# Patient Record
Sex: Female | Born: 1950 | Race: White | Hispanic: No | Smoking: Never smoker
Health system: Southern US, Community
[De-identification: ages and names within clinical notes are randomized; demographics above are authoritative.]

## PROBLEM LIST (undated history)

## (undated) DIAGNOSIS — A770 Spotted fever due to Rickettsia rickettsii: Secondary | ICD-10-CM

## (undated) DIAGNOSIS — I5181 Takotsubo syndrome: Secondary | ICD-10-CM

## (undated) DIAGNOSIS — F329 Major depressive disorder, single episode, unspecified: Secondary | ICD-10-CM

## (undated) DIAGNOSIS — F419 Anxiety disorder, unspecified: Secondary | ICD-10-CM

## (undated) DIAGNOSIS — I10 Essential (primary) hypertension: Secondary | ICD-10-CM

## (undated) DIAGNOSIS — G919 Hydrocephalus, unspecified: Secondary | ICD-10-CM

## (undated) DIAGNOSIS — F32A Depression, unspecified: Secondary | ICD-10-CM

## (undated) HISTORY — DX: Depression, unspecified: F32.A

## (undated) HISTORY — PX: TONSILLECTOMY: SUR1361

## (undated) HISTORY — PX: CHOLECYSTECTOMY: SHX55

## (undated) HISTORY — DX: Essential (primary) hypertension: I10

## (undated) HISTORY — DX: Hydrocephalus, unspecified: G91.9

## (undated) HISTORY — DX: Major depressive disorder, single episode, unspecified: F32.9

## (undated) HISTORY — DX: Anxiety disorder, unspecified: F41.9

## (undated) HISTORY — PX: CARDIAC CATHETERIZATION: SHX172

## (undated) HISTORY — PX: BRAIN SURGERY: SHX531

## (undated) HISTORY — DX: Takotsubo syndrome: I51.81

## (undated) HISTORY — PX: ABDOMINAL HYSTERECTOMY: SHX81

## (undated) HISTORY — PX: OTHER SURGICAL HISTORY: SHX169

## (undated) HISTORY — DX: Spotted fever due to Rickettsia rickettsii: A77.0

---

## 2002-03-09 ENCOUNTER — Encounter: Payer: Self-pay | Admitting: Emergency Medicine

## 2002-03-09 ENCOUNTER — Emergency Department (HOSPITAL_COMMUNITY): Admission: EM | Admit: 2002-03-09 | Discharge: 2002-03-09 | Payer: Self-pay | Admitting: Emergency Medicine

## 2004-08-19 ENCOUNTER — Other Ambulatory Visit: Admission: RE | Admit: 2004-08-19 | Discharge: 2004-08-19 | Payer: Self-pay | Admitting: Dermatology

## 2008-10-26 DIAGNOSIS — A770 Spotted fever due to Rickettsia rickettsii: Secondary | ICD-10-CM

## 2008-10-26 DIAGNOSIS — G919 Hydrocephalus, unspecified: Secondary | ICD-10-CM

## 2008-10-26 HISTORY — DX: Hydrocephalus, unspecified: G91.9

## 2008-10-26 HISTORY — PX: OTHER SURGICAL HISTORY: SHX169

## 2008-10-26 HISTORY — DX: Spotted fever due to Rickettsia rickettsii: A77.0

## 2009-02-21 ENCOUNTER — Emergency Department (HOSPITAL_COMMUNITY): Admission: EM | Admit: 2009-02-21 | Discharge: 2009-02-21 | Payer: Self-pay | Admitting: Emergency Medicine

## 2009-03-07 ENCOUNTER — Inpatient Hospital Stay (HOSPITAL_COMMUNITY): Admission: RE | Admit: 2009-03-07 | Discharge: 2009-03-09 | Payer: Self-pay | Admitting: Neurological Surgery

## 2009-04-01 ENCOUNTER — Encounter: Admission: RE | Admit: 2009-04-01 | Discharge: 2009-04-01 | Payer: Self-pay | Admitting: Neurological Surgery

## 2009-04-08 ENCOUNTER — Encounter: Admission: RE | Admit: 2009-04-08 | Discharge: 2009-04-08 | Payer: Self-pay | Admitting: Neurological Surgery

## 2009-05-06 ENCOUNTER — Encounter: Admission: RE | Admit: 2009-05-06 | Discharge: 2009-05-06 | Payer: Self-pay | Admitting: Neurological Surgery

## 2009-05-21 ENCOUNTER — Encounter: Admission: RE | Admit: 2009-05-21 | Discharge: 2009-05-21 | Payer: Self-pay | Admitting: *Deleted

## 2009-07-12 ENCOUNTER — Encounter: Admission: RE | Admit: 2009-07-12 | Discharge: 2009-07-12 | Payer: Self-pay | Admitting: Neurological Surgery

## 2009-12-27 ENCOUNTER — Encounter: Admission: RE | Admit: 2009-12-27 | Discharge: 2009-12-27 | Payer: Self-pay | Admitting: Neurological Surgery

## 2010-02-25 ENCOUNTER — Ambulatory Visit: Payer: Self-pay | Admitting: Family Medicine

## 2010-02-25 DIAGNOSIS — F329 Major depressive disorder, single episode, unspecified: Secondary | ICD-10-CM | POA: Insufficient documentation

## 2010-02-25 DIAGNOSIS — I10 Essential (primary) hypertension: Secondary | ICD-10-CM

## 2010-02-25 DIAGNOSIS — R1909 Other intra-abdominal and pelvic swelling, mass and lump: Secondary | ICD-10-CM

## 2010-02-25 DIAGNOSIS — K5909 Other constipation: Secondary | ICD-10-CM | POA: Insufficient documentation

## 2010-02-25 DIAGNOSIS — F411 Generalized anxiety disorder: Secondary | ICD-10-CM | POA: Insufficient documentation

## 2010-02-25 DIAGNOSIS — J45909 Unspecified asthma, uncomplicated: Secondary | ICD-10-CM | POA: Insufficient documentation

## 2010-02-25 DIAGNOSIS — N760 Acute vaginitis: Secondary | ICD-10-CM | POA: Insufficient documentation

## 2010-02-26 ENCOUNTER — Encounter: Payer: Self-pay | Admitting: Physician Assistant

## 2010-02-26 LAB — CONVERTED CEMR LAB
Candida species: NEGATIVE
Gardnerella vaginalis: NEGATIVE

## 2010-02-27 ENCOUNTER — Telehealth: Payer: Self-pay | Admitting: Family Medicine

## 2010-03-04 ENCOUNTER — Encounter (INDEPENDENT_AMBULATORY_CARE_PROVIDER_SITE_OTHER): Payer: Self-pay | Admitting: *Deleted

## 2010-03-04 ENCOUNTER — Ambulatory Visit (HOSPITAL_COMMUNITY): Admission: RE | Admit: 2010-03-04 | Discharge: 2010-03-04 | Payer: Self-pay | Admitting: Family Medicine

## 2010-03-06 ENCOUNTER — Encounter: Payer: Self-pay | Admitting: Physician Assistant

## 2010-03-06 ENCOUNTER — Telehealth: Payer: Self-pay | Admitting: Family Medicine

## 2010-03-11 ENCOUNTER — Ambulatory Visit (HOSPITAL_COMMUNITY): Admission: RE | Admit: 2010-03-11 | Discharge: 2010-03-11 | Payer: Self-pay | Admitting: Family Medicine

## 2010-03-21 ENCOUNTER — Ambulatory Visit: Payer: Self-pay | Admitting: Family Medicine

## 2010-03-21 ENCOUNTER — Other Ambulatory Visit: Admission: RE | Admit: 2010-03-21 | Discharge: 2010-03-21 | Payer: Self-pay | Admitting: Family Medicine

## 2010-03-21 DIAGNOSIS — Z780 Asymptomatic menopausal state: Secondary | ICD-10-CM | POA: Insufficient documentation

## 2010-03-21 DIAGNOSIS — R252 Cramp and spasm: Secondary | ICD-10-CM

## 2010-03-22 ENCOUNTER — Encounter: Payer: Self-pay | Admitting: Physician Assistant

## 2010-03-24 ENCOUNTER — Telehealth: Payer: Self-pay | Admitting: Physician Assistant

## 2010-03-25 LAB — CONVERTED CEMR LAB: Candida species: NEGATIVE

## 2010-03-26 ENCOUNTER — Encounter: Payer: Self-pay | Admitting: Physician Assistant

## 2010-03-26 DIAGNOSIS — I5181 Takotsubo syndrome: Secondary | ICD-10-CM

## 2010-03-26 HISTORY — DX: Takotsubo syndrome: I51.81

## 2010-04-01 ENCOUNTER — Encounter: Payer: Self-pay | Admitting: Physician Assistant

## 2010-04-01 LAB — CONVERTED CEMR LAB
Albumin: 4.6 g/dL (ref 3.5–5.2)
Alkaline Phosphatase: 60 units/L (ref 39–117)
BUN: 12 mg/dL (ref 6–23)
HCT: 40.3 % (ref 36.0–46.0)
HDL: 44 mg/dL (ref >39)
MCV: 103.9 fL — ABNORMAL HIGH (ref 78.0–100.0)
Platelets: 247 10*3/uL (ref 150–400)
RBC: 3.88 M/uL (ref 3.87–5.11)
TSH: 1.913 microintl units/mL (ref 0.350–4.500)
Total Bilirubin: 0.6 mg/dL (ref 0.3–1.2)
Total Protein: 7.1 g/dL (ref 6.0–8.3)
Triglycerides: 135 mg/dL (ref <150)
VLDL: 27 mg/dL (ref 0–40)

## 2010-04-04 ENCOUNTER — Ambulatory Visit (HOSPITAL_COMMUNITY): Admission: RE | Admit: 2010-04-04 | Discharge: 2010-04-04 | Payer: Self-pay | Admitting: Family Medicine

## 2010-04-08 ENCOUNTER — Ambulatory Visit: Payer: Self-pay | Admitting: Gastroenterology

## 2010-04-18 ENCOUNTER — Ambulatory Visit: Payer: Self-pay | Admitting: Internal Medicine

## 2010-04-18 ENCOUNTER — Encounter (INDEPENDENT_AMBULATORY_CARE_PROVIDER_SITE_OTHER): Payer: Self-pay | Admitting: Interventional Cardiology

## 2010-04-18 ENCOUNTER — Telehealth: Payer: Self-pay | Admitting: Physician Assistant

## 2010-04-19 ENCOUNTER — Encounter: Payer: Self-pay | Admitting: Physician Assistant

## 2010-05-02 ENCOUNTER — Encounter: Payer: Self-pay | Admitting: Physician Assistant

## 2010-05-06 ENCOUNTER — Ambulatory Visit: Payer: Self-pay | Admitting: Family Medicine

## 2010-05-06 DIAGNOSIS — E785 Hyperlipidemia, unspecified: Secondary | ICD-10-CM | POA: Insufficient documentation

## 2010-05-30 ENCOUNTER — Encounter: Payer: Self-pay | Admitting: Physician Assistant

## 2010-05-30 ENCOUNTER — Ambulatory Visit (HOSPITAL_COMMUNITY): Admission: RE | Admit: 2010-05-30 | Discharge: 2010-05-30 | Payer: Self-pay | Admitting: Family Medicine

## 2010-06-02 ENCOUNTER — Telehealth: Payer: Self-pay | Admitting: Physician Assistant

## 2010-06-03 ENCOUNTER — Telehealth: Payer: Self-pay | Admitting: Physician Assistant

## 2010-06-09 ENCOUNTER — Telehealth: Payer: Self-pay | Admitting: Family Medicine

## 2010-06-13 ENCOUNTER — Ambulatory Visit: Payer: Self-pay | Admitting: Family Medicine

## 2010-06-13 DIAGNOSIS — L259 Unspecified contact dermatitis, unspecified cause: Secondary | ICD-10-CM

## 2010-06-17 ENCOUNTER — Telehealth: Payer: Self-pay | Admitting: Physician Assistant

## 2010-07-03 ENCOUNTER — Encounter (INDEPENDENT_AMBULATORY_CARE_PROVIDER_SITE_OTHER): Payer: Self-pay | Admitting: *Deleted

## 2010-08-07 ENCOUNTER — Encounter: Payer: Self-pay | Admitting: Physician Assistant

## 2010-08-07 LAB — CONVERTED CEMR LAB
Alkaline Phosphatase: 56 units/L (ref 39–117)
Cholesterol: 154 mg/dL (ref 0–200)
Indirect Bilirubin: 0.5 mg/dL (ref 0.0–0.9)
Total Protein: 7.2 g/dL (ref 6.0–8.3)
Triglycerides: 141 mg/dL (ref <150)
VLDL: 28 mg/dL (ref 0–40)

## 2010-08-12 ENCOUNTER — Ambulatory Visit: Payer: Self-pay | Admitting: Family Medicine

## 2010-08-12 DIAGNOSIS — E559 Vitamin D deficiency, unspecified: Secondary | ICD-10-CM | POA: Insufficient documentation

## 2010-08-12 DIAGNOSIS — B351 Tinea unguium: Secondary | ICD-10-CM | POA: Insufficient documentation

## 2010-08-13 ENCOUNTER — Telehealth: Payer: Self-pay | Admitting: Physician Assistant

## 2010-08-13 ENCOUNTER — Encounter: Payer: Self-pay | Admitting: Physician Assistant

## 2010-08-13 LAB — CONVERTED CEMR LAB
BUN: 16 mg/dL (ref 6–23)
CO2: 32 meq/L (ref 19–32)
Calcium: 10.3 mg/dL (ref 8.4–10.5)
Chloride: 101 meq/L (ref 96–112)
Glucose, Bld: 162 mg/dL — ABNORMAL HIGH (ref 70–99)
Potassium: 3.8 meq/L (ref 3.5–5.3)
Sodium: 142 meq/L (ref 135–145)
Vit D, 25-Hydroxy: 51 ng/mL (ref 30–89)

## 2010-10-02 ENCOUNTER — Inpatient Hospital Stay (HOSPITAL_COMMUNITY): Admission: EM | Admit: 2010-10-02 | Discharge: 2010-04-19 | Payer: Self-pay | Admitting: Emergency Medicine

## 2010-10-03 ENCOUNTER — Telehealth: Payer: Self-pay | Admitting: Family Medicine

## 2010-11-04 ENCOUNTER — Encounter: Payer: Self-pay | Admitting: Family Medicine

## 2010-11-12 ENCOUNTER — Telehealth: Payer: Self-pay | Admitting: Family Medicine

## 2010-11-16 ENCOUNTER — Encounter: Payer: Self-pay | Admitting: Family Medicine

## 2010-11-18 ENCOUNTER — Ambulatory Visit: Admission: RE | Admit: 2010-11-18 | Payer: Self-pay | Source: Home / Self Care | Admitting: Family Medicine

## 2010-11-18 ENCOUNTER — Ambulatory Visit
Admission: RE | Admit: 2010-11-18 | Discharge: 2010-11-18 | Payer: Self-pay | Source: Home / Self Care | Attending: Family Medicine | Admitting: Family Medicine

## 2010-11-19 ENCOUNTER — Encounter: Payer: Self-pay | Admitting: Family Medicine

## 2010-11-25 NOTE — Letter (Signed)
Summary: Laboratory/X-Ray Results  [facility]  [address]   [place], [place] [address]   Phone: [phone]  Fax: [phone]    Lab/X-Ray Results  April 01, 2010  MRN: [id number]  Angela Duncan [address] [place], [place]  [address]    The results of your recent lab/x-ray has been reviewed and were found:  Your cholesterol levels are high.  I have enclosed a cholesterol diet handout for you.  You need to follow a low fat diet to try to lower your cholesterol.  Exercise will also help.  Try to walk 3 or more days a week for at least 30 minutes.  You will need to have your cholesterol levels checked again in 3 months.  If your cholesterol stays high at that time then you will need cholesterol medication.  Your Vitamin D level is low.  I have sent a prescription for Vitamin D to your pharmacy in [place].  You will take this once a week.  If you have any questions, please contact our office.     Esperanza Sheets PA

## 2010-11-25 NOTE — Progress Notes (Signed)
Summary: [facility]   Imported By: Lind Guest 05/06/2010 16:38:08  _____________________________________________________________________  External Attachment:    Type:   Image     Comment:   External Document

## 2010-11-25 NOTE — Assessment & Plan Note (Signed)
Summary: follow up from hospital- room 1   Vital Signs:  Patient profile:   60 year old female Height:      63.5 inches Weight:      127.75 pounds BMI:     22.36 O2 Sat:      94 % on Room air Pulse rate:   72 / minute Resp:     16 per minute BP sitting:   162 / 90  (left arm)  Vitals Entered By: Adella Hare LPN (May 06, 2010 1:01 PM) CC: hospital follow up Is Patient Diabetic? No Pain Assessment Patient in pain? no      Comments did not bring meds to ov   Primary Provider:  Garnette Czech, [place]  CC:  hospital follow up.  History of Present Illness: Pt presents today with c/o shortness of breath with exertion.  She has had this for years and no change.  Even just walking to her mailbox she has difficulty breathing.  She was previously seeing Dr Juanetta Gosling and in the past was prescribed Advair.  Pt stopped using this, states that it didnt help.    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  Cardiac eval was neg.  Pt saw Dr Katrinka Blazing, cardiologist for f/u.  Her next f/u appt is in 6 mos. Pt states he ordered Nitro for her to use as needed, and also started her on Crestor.  No f/u labs ordered per pt.  Pt has no other complaints or concerns.    Allergies (verified): No Known Drug Allergies  Past History:  Past medical, surgical, family and social histories (including risk factors) reviewed, and no changes noted (except as noted below).  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  We will manage her gynecology care.  Past Surgical History: Reviewed history from 04/08/2010 and no changes required. 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 mother living-  thyroid dz father deceased- htn one brother living-heart dz two sisters living- htn, hyperlipidemia, thyroid No FH of Colon Cancer or polyps  Social History: Reviewed history from 04/08/2010 and no changes required. 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 CV:  Complains of shortness of breath with exertion; denies chest pain or discomfort and palpitations. Resp:  Complains of cough, shortness of breath, and sputum productive; denies wheezing; INTERMITTENT COUGH, SOMETIMES PRODUCTIVE WITH WHITE PHLEGM.  Physical Exam  General:  Well-developed,well-nourished,in no acute distress; alert,appropriate and cooperative throughout examination Head:  Normocephalic and atraumatic without obvious abnormalities. No apparent alopecia or balding. Ears:  External ear exam shows no significant lesions or deformities.  Otoscopic examination reveals clear canals, tympanic membranes are intact bilaterally without bulging, retraction, inflammation or discharge. Hearing is grossly normal bilaterally. Nose:  External nasal examination shows no deformity or inflammation. Nasal mucosa are pink and moist without lesions or exudates. Mouth:  Oral mucosa and oropharynx without lesions or exudates. Neck:  No deformities, masses, or tenderness noted. Lungs:  Normal respiratory effort, chest expands symmetrically. Lungs are clear to auscultation, no crackles or wheezes. Heart:  Normal rate and regular rhythm. S1 and S2 normal without  gallop, murmur, click, rub or other extra sounds. Cervical Nodes:  No lymphadenopathy noted Psych:  Cognition and judgment appear intact. Alert and cooperative with normal attention span and concentration. No apparent delusions, illusions, hallucinations   Impression & Recommendations:  Problem # 1:   ASTHMA (ICD-493.90) Assessment Unchanged  Her updated medication list for this problem includes:    Symbicort 80-4.5 Mcg/act Aero (Budesonide-formoterol fumarate) ..... Use 2 puffs two times a day  Orders: PFT Baseline-Pre/Post Bronchodiolator (PFT Baseline-Pre/Pos)  Problem # 2:  HYPERTENSION (ICD-401.9) Assessment: Deteriorated  The following medications were removed from the medication list:    Clonidine Hcl 0.2 Mg Tabs (Clonidine hcl) .Marland Kitchen... Take 1 tablet at bedtime Her updated medication list for this problem includes:    Lisinopril 5 Mg Tabs (Lisinopril) ..... One tab by mouth once daily    Hydrochlorothiazide 25 Mg Tabs (Hydrochlorothiazide) ..... One tab by mouth once daily  BP today: 162/90 Prior BP: 132/82 (04/08/2010)  Labs Reviewed: K+: 4.0 (03/26/2010) Creat: : 0.63 (03/26/2010)   Chol: 232 (03/26/2010)   HDL: 44 (03/26/2010)   LDL: 161 (03/26/2010)   TG: 135 (03/26/2010)  Problem # 3:  HYPERLIPIDEMIA (ICD-272.4) Assessment: New  Her updated medication list for this problem includes:    Crestor 40 Mg Tabs (Rosuvastatin calcium) .Marland Kitchen... Take 1 daily  Orders: T-Lipid Profile ([id number]) T-Hepatic Function ([phone])  Complete Medication List: 1)  Alprazolam 0.5 Mg Tabs (Alprazolam) .... At bedtime 2)  Lisinopril 5 Mg Tabs (Lisinopril) .... One tab by mouth once daily 3)  Hydrochlorothiazide 25 Mg Tabs (Hydrochlorothiazide) .... One tab by mouth once daily 4)  Aspir-low 81 Mg Tbec (Aspirin) .... One tab by mouth once daily 5)  Vitamin D (ergocalciferol) 50000 Unit Caps (Ergocalciferol) .... Take 1 weekly 6)  Nitrostat 0.4 Mg Subl (Nitroglycerin) .... As directed 7)  Crestor 40 Mg Tabs (Rosuvastatin calcium) .... Take 1 daily 8)  Symbicort 80-4.5 Mcg/act Aero (Budesonide-formoterol fumarate) .... Use 2 puffs two times a day  Patient Instructions: 1)  Please schedule a follow-up appointment in 3 months. 2)  I have ordered blood work for you to have drawn in 3  mos fasting. 3)  I have ordered a breathing test.  This will be scheduled at the pharmacy. 4)  Use Symbicort inhaler two times a day as discussed. Prescriptions: SYMBICORT 80-4.5 MCG/ACT AERO (BUDESONIDE-FORMOTEROL FUMARATE) use 2 puffs two times a day  #1 x 1   Entered and Authorized by:   Esperanza Sheets PA   Signed by:   Esperanza Sheets PA on 05/06/2010   Method used:   Electronically to        [facility] (retail[facility]       [address]       [place], [place]  [address]       Ph: [phone]       Fax: [phone]   RxID:   [id number]

## 2010-11-25 NOTE — Progress Notes (Signed)
Summary: creatine  Phone Note Call from Patient   Summary of Call: pt needs to get creatine drawn at the radiology department. She has a CT scheduled for 03/11/2010 12:30. Please send order over.  Initial call taken by: Rudene Anda,  Mar 06, 2010 3:36 PM  Follow-up for Phone Call        order sent Follow-up by: Adella Hare LPN,  Mar 06, 2010 4:10 PM

## 2010-11-25 NOTE — Letter (Signed)
Summary: Recall Colonoscopy/Endoscopy, Change to Office Visit  [facility]  [address]   [place], [place] [address]   Phone: [phone]  Fax: [phone]      July 03, 2010   BLAYNE GARLICK [address] [place], [place]  [address] 03/09/1951   Dear Ms. Jilek,   According to our records, it is time for you to schedule a Colonoscopy/Endoscopy. However, after reviewing your medical record, we recommend an office visit in order to determine your need for a repeat procedure.  Please call [phone] at your convenience to schedule an office visit. If you have any questions or concerns, please feel free to contact our office.   Sincerely,   Ave Filter  [facility] Ph: [phone]   Fax: [phone]

## 2010-11-25 NOTE — Progress Notes (Signed)
       New/Updated Medications: METROGEL-VAGINAL 0.75 % GEL (METRONIDAZOLE) insert 1 applicator vaginally at bedtime for 5 nights Prescriptions: METROGEL-VAGINAL 0.75 % GEL (METRONIDAZOLE) insert 1 applicator vaginally at bedtime for 5 nights  #1 pack x 0   Entered and Authorized by:   Esperanza Sheets PA   Signed by:   Esperanza Sheets PA on 03/24/2010   Method used:   Electronically to        [facility] (retail)       [address]       [place], [place]  [address]       Ph: [phone]       Fax: [phone]   RxID:   [id number]

## 2010-11-25 NOTE — Progress Notes (Signed)
  Phone Note Call from Patient   Summary of Call: Pt returned my call regarding PFT results.  Pt states she is not using Symbicort.  She tried it and it made her shakey so she stopped.  States she is still shortness of breath, even just walking to the mailbox. Advised pt her PFT shows that her Asthma is severe and that she needs to be on an inhaler every day.  Advised pt that she must use her Symbicort 2 puffs every morning and night.  Discussed with pt that some people to get shakey, but that this usually goes away after about 15 minutes. Initial call taken by: Esperanza Sheets PA,  June 02, 2010 3:50 PM

## 2010-11-25 NOTE — Progress Notes (Signed)
Summary: medication  Phone Note Call from Patient   Summary of Call: patient called in and questioned about her medication being called into the pharmacy in [place], I told her that clonazepam and a cream was called in yesterday.  She states she called the pharmacy and they only got the cream medication.  Please advise.  Thanks Initial call taken by: Curtis Sites,  August 13, 2010 3:57 PM  Follow-up for Phone Call        called and gave order to pharmacist verbally Follow-up by: Adella Hare LPN,  August 14, 2010 11:07 AM

## 2010-11-25 NOTE — Progress Notes (Signed)
Summary: test results  Phone Note Call from Patient   Summary of Call: needs to speak with nurse about test results again. sister [phone] Initial call taken by: Rudene Anda,  Feb 27, 2010 1:05 PM  Follow-up for Phone Call        wanted name of std again, trying to help patient understand Follow-up by: Adella Hare LPN,  Feb 28, 4539 3:01 PM

## 2010-11-25 NOTE — Letter (Signed)
Summary: Discharge Summary  Discharge Summary   Imported By: Lind Guest 05/06/2010 16:38:31  _____________________________________________________________________  External Attachment:    Type:   Image     Comment:   External Document

## 2010-11-25 NOTE — Progress Notes (Signed)
Summary: call  Phone Note Call from Patient   Summary of Call: said you forgot to tell her something so call her back Initial call taken by: Lind Guest,  June 03, 2010 9:02 AM  Follow-up for Phone Call        Discussed with pt the difference btwn maintenance inhaler (Symbicort) and use of rescue inhaler ( albuterol).  Pt verbalizes understanding. Follow-up by: Esperanza Sheets PA,  June 03, 2010 10:27 AM

## 2010-11-25 NOTE — Assessment & Plan Note (Signed)
Summary: new patient- room 1   Vital Signs:  Patient profile:   60 year old female Height:      63.5 inches Weight:      132 pounds BMI:     23.10 O2 Sat:      94 % on Room air Pulse rate:   65 / minute Resp:     16 per minute BP sitting:   134 / 80  (left arm)  Vitals Entered By: Adella Hare LPN (Feb 25, 1609 10:05 AM) CC: new patient Is Patient Diabetic? No Pain Assessment Patient in pain? no        CC:  new patient.  History of Present Illness: New pt here to establish care.  She is present with her sister today, who helps her with her communication & understanding. Has been seeing Dr Juanetta Gosling and he advised her that she needed a GYN due to her vag dischg complaints.  She still plans to see Dr Juanetta Gosling for her other medical management.  Pt states she has had vag dischg x 2 weeks now.  Doesn't seem to be as bad today as it was a couple days ago. She states it has had an odor & itching.  Pt does have a new partner & is not using condoms.  Hx of htn. Taking medications as prescribed. No headache, chest pain or palpitations. Has been using Alprazolam at bedtime since husb passed away 11-13-2008. Dx with asthma 10 or more yrs ago.  Used inhalers in the past, but had run out & was unable to afford them.       Current Medications (verified): 1)  Alprazolam 0.5 Mg Tabs (Alprazolam) .... One Tab By Mouth Two Times A Day As Needed For Nerves 2)  Lisinopril 5 Mg Tabs (Lisinopril) .... One Tab By Mouth Once Daily 3)  Hydrochlorothiazide 25 Mg Tabs (Hydrochlorothiazide) .... One Tab By Mouth Once Daily 4)  Aspir-Low 81 Mg Tbec (Aspirin) .... One Tab By Mouth Once Daily  Allergies (verified): No Known Drug Allergies  Past History:  Past medical, surgical, family and social histories (including risk factors) reviewed for relevance to current acute and chronic problems.  Past Medical History: Anxiety Asthma Depression Hypertension Hydrocephalus - 2010 [facility] Spotted  Fever - 2010  Dr Juanetta Gosling managing pts medical care (anxiety, HTN, Asthma).  We will manage her gynecology care.  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 mother living- thyroid dz father deceased- htn one brother living-heart dz two sisters living- htn, hyperlipidemia, thyroid  Social History: Reviewed history and no changes required. 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 CV:  Complains of palpitations; denies chest pain or discomfort; MY HEART GOES CRAZY SOMETIMES, WHEN I OVER-DO MYSELF. Resp:  Complains of cough, shortness of breath, and wheezing. GI:  Complains of constipation; denies abdominal pain; HX OF CONSTIPATION X YRS.  MUST USE ENEMA TO HAVE BM & DOESNT HAVE A BM UNLESS USES ONE.  NO PREVIOUS COLONOSCOPY. GU:  Complains of discharge; denies abnormal vaginal bleeding, dysuria, and genital sores. MS:  Complains of cramps; BILAT LE AT HS, EVERY NIGHT.  Physical Exam  General:  Well-developed,well-nourished,in no acute distress; alert,appropriate and cooperative throughout examination Head:  Normocephalic and atraumatic  without obvious abnormalities. No apparent alopecia or balding. Ears:  External ear exam shows no significant lesions or deformities.  Otoscopic examination reveals clear canals, tympanic membranes are intact bilaterally without bulging, retraction, inflammation or discharge. Hearing is grossly normal bilaterally. Nose:  External nasal examination shows no deformity or inflammation. Nasal mucosa are pink and moist without lesions or exudates. Mouth:  Oral mucosa and oropharynx without lesions or exudates.  teeth missing.   Neck:  No deformities, masses, or  tenderness noted. Lungs:  Normal respiratory effort, chest expands symmetrically. Lungs are clear to auscultation, no crackles or wheezes. Heart:  Normal rate and regular rhythm. S1 and S2 normal without gallop, murmur, click, rub or other extra sounds. Abdomen:  soft and normal bowel sounds.  Diffuse TTP, no guarding, rebound or rigidity.  Genitalia:  normal introitus and no external lesions.  Cervix absent.  Petechiae noted vag wall, with lg amt of yellowish frothy dischg.  Bimanual vag absent.  Rt adnexal mass grapefruit sized noted, + TTP. Cervical Nodes:  No lymphadenopathy noted Psych:  normally interactive and good eye contact.     Impression & Recommendations:  Problem # 1:  VAGINITIS (ICD-616.10) Assessment New  Discussed with pt that this appears to be trichamoniasis.  Will need to wait for cx results to confirm. Discussed with pt that this is an STD.  She has a hard time understanding this.  Asks that I call her sister with her test results & she is on pts release of information form.  Her updated medication list for this problem includes:    Flagyl 500 Mg Tabs (Metronidazole) .Marland Kitchen... Take 1 tablet two times a day for 7 days  Orders: T-Wet Prep by Molecular Probe [phone]) T-Chlamydia & GC Probe, Genital ([phone])  Problem # 2:  CONSTIPATION, CHRONIC (ICD-564.09) Assessment: Comment Only  Orders: Gastroenterology Referral (GI)  Problem # 3:  ADNEXAL MASS, RIGHT (ICD-789.39) Assessment: New  Orders: Pelvic Ultrasound (Sono Pelvis)  Complete Medication List: 1)  Alprazolam 0.5 Mg Tabs (Alprazolam) .... One tab by mouth two times a day as needed for nerves 2)  Lisinopril 5 Mg Tabs (Lisinopril) .... One tab by mouth once daily 3)  Hydrochlorothiazide 25 Mg Tabs (Hydrochlorothiazide) .... One tab by mouth once daily 4)  Aspir-low 81 Mg Tbec (Aspirin) .... One tab by mouth once daily 5)  Flagyl 500 Mg Tabs (Metronidazole) .... Take 1 tablet two times a day  for 7 days  Patient Instructions: 1)  Continue care with Dr Juanetta Gosling.  Contact him regarding your asthma. 2)  I have referred you to a GI Dr regarding your constipation. 3)  I have prescribed an antibiotic for your vaginal infection. 4)  I have ordered a pelvic ultrasound. 5)  You will need to schedule an appt for a breast exam & pap smear in the next 1-2 mos. Prescriptions: FLAGYL 500 MG TABS (METRONIDAZOLE) take 1 tablet two times a day for 7 days  #14 x 0   Entered and Authorized by:   Esperanza Sheets PA   Signed by:   Esperanza Sheets PA on 02/25/2010   Method used:   [facility]ron[facility] to        [facility]       [address]       [place], [place]  [address]       Ph: [phone]       Fax: [phone]   RxID:   [id number]

## 2010-11-25 NOTE — Assessment & Plan Note (Signed)
Summary: CHRONIC CONSTIPATION   Visit Type:  Initial Consult Primary Care Provider:  Garnette Czech, [place]  Chief Complaint:  constipation.  History of Present Illness: Has constipation all her life. Gives herself a water enema: 1-2 times a week when her stomach starts working. Meds tried: no meds tried. No TCS ever. No rectal bleeding. Water: not much. Fiber: loves salad, broccoli,  cauliflower. No fiber supplements tried. No thyroid check. Sometimes gets the urge to have a BMs: solid, no diarrhea. 1 child: ? vaginal tear. No pelvic trauma. Partial hysterectomy: bowels no worse after surgery.  Current Medications (verified): 1)  Alprazolam 0.5 Mg Tabs (Alprazolam) .... At Bedtime 2)  Lisinopril 5 Mg Tabs (Lisinopril) .... One Tab By Mouth Once Daily 3)  Hydrochlorothiazide 25 Mg Tabs (Hydrochlorothiazide) .... One Tab By Mouth Once Daily 4)  Aspir-Low 81 Mg Tbec (Aspirin) .... One Tab By Mouth Once Daily 5)  Clonidine Hcl 0.2 Mg Tabs (Clonidine Hcl) .... Take 1 Tablet At Bedtime 6)  Vitamin D (Ergocalciferol) 50000 Unit Caps (Ergocalciferol) .... Take 1 Weekly  Allergies (verified): No Known Drug Allergies  Past History:  Past Medical History: Last updated: 02/25/2010 Anxiety Asthma Depression Hypertension Hydrocephalus - 2010 [place] Spotted Fever - 2010  Dr Juanetta Gosling managing pts medical care (anxiety, HTN, Asthma).  We will manage her gynecology care.  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  Vital Signs:  Patient profile:   59 year old female Height:      63.5 inches Weight:      130 pounds BMI:     22.75 Temp:     98.0 degrees F oral Pulse rate:   68 / minute BP sitting:   132 / 82  (left arm) Cuff size:   regular  Vitals Entered By: Hendricks Limes LPN (April 08, 2010 9:54 AM)  Physical Exam  General:  Well developed, well nourished, no acute distress. Head:  Normocephalic and atraumatic. Eyes:  PERRLA, no icterus. Mouth:  No deformity or lesions, dentures: upper Lungs:  Clear throughout to auscultation. Heart:  Regular rate and rhythm; no murmurs. Abdomen:  Soft, nontender and nondistended. Normal bowel sounds. Extremities:  No edema or deformities noted. Neurologic:  Alert and  oriented x4;  grossly normal neurologically.  Impression & Recommendations:  Problem # 1:  CONSTIPATION, CHRONIC (ICD-564.09) Assessment Unchanged  Pt is med naive. DRINK 6 TO 8 CUPS OF WATER DAILY. EAT A HIGH FIBER DIET. ADD AMITIZA TWICE A DAY. LOWER ENDOSCOPY ON JUNE 27-Trilyte Prep. FOLLOW UP IN 3 MONTHS. Consider Sitz Marker test if pt interested in surgery. No harm in pt using tap water enemas as needed.   CC: PCP  Orders: Consultation Level IV ([address])  Patient Instructions: 1)  DRINK 6 TO 8 CUPS OF WATER DAILY. 2)  EAT A HIGH FIBER DIET. 3)  ADD AMITIZA TWICE A DAY. 4)  LOWER ENDOSCOPY ON JUNE 7. 5)  FOLLOW UP IN 3 MONTHS.  6)  The medication list was reviewed and reconciled.  All changed / newly prescribed medications were explained.  A complete medication list was provided to the patient / caregiver. Prescriptions: AMITIZA 24 MCG CAPS (LUBIPROSTONE) 1 by mouth two times a day. Make cause nausea.  #60 x 5   Entered and Authorized by:   West Bali MD   Signed by:   West Bali MD on 04/08/2010   Method used:   Print then Give to Patient   RxID:   [id number]

## 2010-11-25 NOTE — Letter (Signed)
Summary: Scheduled Appointment  [facility]  [address]   [place], [place] [address]   Phone: [phone]  Fax: [phone]    Mar 04, 2010   Dear: Jacklyn Shell            DOB: 30-Jun-1951    I have been instructed to schedule you an appointment in our office.  Your appointment is as follows:   Date:   April 08, 2010   Time:    10 AM    Please be here 15 minutes early.   Provider:   DR. FIELDS   Please contact the office if you need to reschedule this appointment for a more convenient time.   Thank you,    Diana Eves       [facility] Ph: [phone]   Fax: [phone]

## 2010-11-25 NOTE — Assessment & Plan Note (Signed)
Summary: itching- room 1   Vital Signs:  Patient profile:   60 year old female Height:      63.5 inches Weight:      124.75 pounds BMI:     21.83 O2 Sat:      97 % on Room air Pulse rate:   51 / minute Resp:     16 per minute BP sitting:   120 / 76  (left arm)  Vitals Entered By: Adella Hare LPN (June 13, 2010 10:15 AM) CC: itching all over Is Patient Diabetic? No Pain Assessment Patient in pain? no      Comments did not bring meds to ov   Primary Karne Ozga:  Garnette Czech, PA  CC:  itching all over.  History of Present Illness: Pt presents today with c/o itching x 1 mos.  Started on her back but now is all over. She wonders if this may be due to change in laundry detergent and dryer sheets.  Also started Crestor about one mos ago.  She has a new prescription for Simvastatin but hasnt started yet.  Has been trying to finish up the Crestor prescription. No new foods.  No previous hx of.   Allergies (verified): No Known Drug Allergies  Past History:  Past medical history reviewed for relevance to current acute and chronic problems.  Past Medical History: Reviewed history from 05/06/2010 and no changes required. 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  We will manage her gynecology care.  Review of Systems General:  Denies chills and fever. ENT:  Denies earache, nasal congestion, postnasal drainage, and sore throat. Resp:  Denies cough. Derm:  Complains of itching and rash. Allergy:  Denies itching eyes and sneezing.  Physical Exam  General:  Well-developed,well-nourished,in no acute distress; alert,appropriate and cooperative throughout examination Head:  Normocephalic and atraumatic without obvious abnormalities. No apparent alopecia or balding. Ears:  External ear exam shows no significant lesions or  deformities.  Otoscopic examination reveals clear canals, tympanic membranes are intact bilaterally without bulging, retraction, inflammation or discharge. Hearing is grossly normal bilaterally. Nose:  External nasal examination shows no deformity or inflammation. Nasal mucosa are pink and moist without lesions or exudates. Mouth:  Oral mucosa and oropharynx without lesions or exudates.  Neck:  No deformities, masses, or tenderness noted. Lungs:  Normal respiratory effort, chest expands symmetrically. Lungs are clear to auscultation, no crackles or wheezes. Heart:  Normal rate and regular rhythm. S1 and S2 normal without gallop, murmur, click, rub or other extra sounds. Skin:  erythematous rash noted midline lumbar spine, multiple tiny pinpoint papules, no vesicles or pustules.  remainder of skin is clear. Cervical Nodes:  No lymphadenopathy noted Psych:  normally interactive and good eye contact.     Impression & Recommendations:  Problem # 1:  DERMATITIS (ICD-692.9) Assessment New  Questioin allergic.  Her updated medication list for this problem includes:    Loratadine 10 Mg Tabs (Loratadine) .Marland Kitchen... Take 1 daily for itching    Triamcinolone Acetonide 0.1 % Crea (Triamcinolone acetonide) .Marland Kitchen... Apply two times a day to rash  Complete Medication List: 1)  Alprazolam 0.5 Mg Tabs (Alprazolam) .... At bedtime 2)  Lisinopril 5 Mg Tabs (Lisinopril) .... One tab by mouth once daily 3)  Hydrochlorothiazide 25 Mg Tabs (Hydrochlorothiazide) .... One tab by mouth once daily 4)  Aspir-low 81 Mg Tbec (Aspirin) .... One tab  by mouth once daily 5)  Vitamin D (ergocalciferol) 50000 Unit Caps (Ergocalciferol) .... Take 1 weekly 6)  Nitrostat 0.4 Mg Subl (Nitroglycerin) .... As directed 7)  Crestor 40 Mg Tabs (Rosuvastatin calcium) .... Take 1 daily 8)  Symbicort 80-4.5 Mcg/act Aero (Budesonide-formoterol fumarate) .... Use 2 puffs two times a day 9)  Ventolin Hfa 108 (90 Base) Mcg/act Aers (Albuterol  sulfate) .... Use 2 puffs every 4 hours as needed for shortness of breath or wheezing 10)  Loratadine 10 Mg Tabs (Loratadine) .... Take 1 daily for itching 11)  Triamcinolone Acetonide 0.1 % Crea (Triamcinolone acetonide) .... Apply two times a day to rash  Patient Instructions: 1)  Keep your next appt. 2)  Switch back to your previous detergents, fabric softener, and body soap. 3)  I have prescribed Loratadine to help with itching. 4)  I hve prescribed a cream to use on the rash. 5)  Discontinue Crestor and start Simvastatin as prescribed by your heart doctor. 6)  If your itching persists I will refer you to a dermatologist. Prescriptions: TRIAMCINOLONE ACETONIDE 0.1 % CREA (TRIAMCINOLONE ACETONIDE) apply two times a day to rash  #30 grams x 0   Entered and Authorized by:   Esperanza Sheets PA   Signed by:   Esperanza Sheets PA on 06/13/2010   Method used:   Electronically to        [facility] (retail[facility]       [address]       [place], [place]  [address]       Ph: [phone]       Fax: [phone]   RxID:   [id number] LORATADINE 10 MG TABS (LORATADINE) take 1 daily for itching  #30 x 0   Entered and Authorized by:   Esperanza Sheets PA   Signed by:   Esperanza Sheets PA on 06/13/2010   Method used:   Electronically to        [facility] (retail)       [address]       [place], [place]  [address]       Ph: [phone]       Fax: [phone]   RxID:   [id number]

## 2010-11-25 NOTE — Letter (Signed)
Summary: [facility]CS ORDER  TCS ORDER   Imported By: Ave Filter 04/08/2010 10:56:39  _____________________________________________________________________  External Attachment:    Type:   Image     Comment:   External Document

## 2010-11-25 NOTE — Progress Notes (Signed)
Summary: speak with nurse  Phone Note Call from Patient   Summary of Call: please call roger at [phone] due to pt having red spots all over her.  Initial call taken by: Rudene Anda,  June 09, 2010 3:19 PM  Follow-up for Phone Call        returned call, no answer left patient message @ her # Follow-up by: Adella Hare LPN,  June 10, 2010 10:28 AM  Additional Follow-up for Phone Call Additional follow up Details #1::        patient has appt friday per roger her boyfriend Additional Follow-up by: Adella Hare LPN,  June 10, 2010 10:38 AM

## 2010-11-25 NOTE — Assessment & Plan Note (Signed)
Summary: physical- room 3   Vital Signs:  Patient profile:   60 year old female Height:      63.5 inches Weight:      130.50 pounds BMI:     22.84 O2 Sat:      91 % on Room air Pulse rate:   70 / minute Resp:     16 per minute BP sitting:   170 / 90  (left arm)  Vitals Entered By: Adella Hare LPN (Mar 21, 2010 9:05 AM) CC: physical Is Patient Diabetic? No Pain Assessment Patient in pain? no      Comments did not bring meds to ov   CC:  physical.  History of Present Illness: Pt is here today for her annual pap/breast exam. She see's Dr Juanetta Gosling for her other medical care.   She is complaining of nocturnal leg cramps. These have been awakening her nightly "for awhile."   She was recently treated for Trichamonis.  She states her partner also was treated.  The dischg that she origianlly presented with has resolved, but she is noticing a fishy odor from her genital area recently.  She denies current dischg.  Hx of partial hyst due to bleeding issues.  No hx of cancer. Per pt last Mamm yrs ago.  + SBE No prev colonoscopy. No eye dr. Has upper & lower false teeth.  Allergies (verified): No Known Drug Allergies  Past History:  Past medical, surgical, family and social histories (including risk factors) reviewed for relevance to current acute and chronic problems.  Past Medical History: Reviewed history from 02/25/2010 and no changes required. Anxiety Asthma Depression Hypertension Hydrocephalus - 2010 [facility] Spotted Fever - 2010  Dr Juanetta Gosling managing pts medical care (anxiety, HTN, Asthma).  We will manage her gynecology care.  Past Surgical History: Reviewed history from 02/25/2010 and no changes required. 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 mother living- thyroid dz father deceased- htn one brother living-heart dz two  sisters living- htn, hyperlipidemia, thyroid  Social History: Reviewed history from 02/25/2010 and no changes required. 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 Resp:  Complains of shortness of breath and wheezing. GU:  Complains of incontinence; denies abnormal vaginal bleeding, discharge, dysuria, and urinary frequency; OCC URGE INCONTINENCE, IF WAITS TOO LONG. Psych:  Denies depression.  Physical Exam  General:  Well-developed,well-nourished,in no acute distress; alert,appropriate and cooperative throughout examination Head:  Normocephalic and atraumatic without obvious abnormalities. No apparent alopecia or balding. Ears:  External ear exam shows no significant lesions or deformities.  Otoscopic examination reveals clear canals, tympanic membranes are intact bilaterally without bulging, retraction, inflammation or discharge. Hearing is grossly normal bilaterally. Nose:  External nasal examination shows no deformity or inflammation. Nasal mucosa are pink and moist without lesions or exudates. Mouth:  Oral mucosa and oropharynx without lesions or exudates.  Neck:  No deformities, masses, or tenderness noted.no thyromegaly.   Chest Wall:  no deformities and no mass.   Breasts:  No mass, nodules, thickening, tenderness, bulging, retraction, inflamation, nipple discharge or skin changes noted.   Lungs:  Normal respiratory effort, chest expands symmetrically. Lungs are clear to auscultation, no crackles or wheezes. Heart:  Normal rate and regular rhythm. S1 and S2 normal without gallop, murmur, click, rub or other  extra sounds. Abdomen:  Bowel sounds positive,abdomen soft and non-tender without masses, organomegaly or hernias noted. Rectal:  No external abnormalities noted. Normal sphincter tone. No rectal masses or tenderness. Genitalia:  Normal introitus for  age, no external lesions, small amount of vaginal discharge, mucosa pink and moist, no vaginal  lesions, no vaginal atrophy, no friaility or hemorrhage, no adnexal masses or tenderness.  Cervix and uterus absent Pulses:  R posterior tibial normal, R dorsalis pedis normal, L posterior tibial normal, and L dorsalis pedis normal.   Extremities:  No clubbing, cyanosis, edema, or deformity noted with normal full range of motion of all joints.   Neurologic:  strength normal in all extremities, sensation intact to light touch, gait normal, and DTRs symmetrical and normal.   Skin:  Intact without suspicious lesions or rashes Cervical Nodes:  No lymphadenopathy noted Psych:  normally interactive and good eye contact.     Impression & Recommendations:  Problem # 1:  Preventive Health Care (ICD-V70.0)  Problem # 2:  LEG CRAMPS, NOCTURNAL (ICD-729.82) Assessment: New Will try initiate trial of clonidine & refer pt back to Dr Juanetta Gosling. Orders: T-CBC No Diff ([id number]) T-TSH ([id number])  Problem # 3:  HYPERTENSION (ICD-401.9) Assessment: Deteriorated  Will schedule pt for an appt with Dr Juanetta Gosling for follow up.  Her BP was much lower at her previous visit and pt states she is taking her medications daily.  Her updated medication list for this problem includes:    Lisinopril 5 Mg Tabs (Lisinopril) ..... One tab by mouth once daily    Hydrochlorothiazide 25 Mg Tabs (Hydrochlorothiazide) ..... One tab by mouth once daily    Clonidine Hcl 0.2 Mg Tabs (Clonidine hcl) .Marland Kitchen... Take 1 tablet at bedtime  Orders: Internal Medicine Referral (Internal) T-CMP with estimated GFR ([id number])  BP today: 170/90 Prior BP: 134/80 (02/25/2010)  Complete Medication List: 1)  Alprazolam 0.5 Mg Tabs (Alprazolam) .... One tab by mouth two times a day as needed for nerves 2)  Lisinopril 5 Mg Tabs (Lisinopril) .... One tab by mouth once daily 3)  Hydrochlorothiazide 25 Mg Tabs (Hydrochlorothiazide) .... One tab by  mouth once daily 4)  Aspir-low 81 Mg Tbec (Aspirin) .... One tab by mouth once daily 5)  Flagyl 500 Mg Tabs (Metronidazole) .... Take 1 tablet two times a day for 7 days 6)  Clonidine Hcl 0.2 Mg Tabs (Clonidine hcl) .... Take 1 tablet at bedtime 7)  Metrogel-vaginal 0.75 % Gel (Metronidazole) .... Insert 1 applicator vaginally at bedtime for 5 nights  Other Orders: T-Lipid Profile [id number]) T-Vitamin D (25-Hydroxy) [phone]) DEXA Bone Density Scan (DEXA Scan) Gastroenterology Referral (GI) Radiology Referral (Radiology) T-Wet Prep by Molecular Probe [phone]) Pap Smear ([id number])  Patient Instructions: 1)  Physical and breast exam in 1 year. 2)  I have referred you for a mammogram and colonoscopy. 3)  We will contact Dr Juanetta Gosling office & have him f/u on your asthma and high blood pressure. 4)  I have ordered blood work for you. 5)  I have prescribed a medication for you to try at bedtime for your leg cramps. Prescriptions: CLONIDINE HCL 0.2 MG TABS (CLONIDINE HCL) take 1 tablet at bedtime  #30 x 0   Entered and Authorized by:   Esperanza Sheets PA   Signed by:   Esperanza Sheets PA on 03/21/2010   Method used:   [facility]ronically to        [facility]retail[facility]       [address]  [place], [place]  [address]       Ph: [phone]       Fax: [phone]   RxID:   [id number]   Laboratory Results    Stool - Occult Blood Hemmoccult #1: negative Date: 03/21/2010

## 2010-11-25 NOTE — Letter (Signed)
Summary: Letter  Letter   Imported By: Lind Guest 08/07/2010 14:55:50  _____________________________________________________________________  External Attachment:    Type:   Image     Comment:   External Document

## 2010-11-25 NOTE — Progress Notes (Signed)
  Phone Note From Pharmacy   [facility] Summary of Call: patient requesting drug cheaper than crestor Initial call taken by: Adella Hare LPN,  June 17, 2010 1:30 PM  Follow-up for Phone Call        I thought her cardiologist changed her to Simvastatin?  That's what she told me at her last OV. Follow-up by: Esperanza Sheets PA,  June 17, 2010 1:32 PM  Additional Follow-up for Phone Call Additional follow up Details #1::        called patient, left message Additional Follow-up by: Adella Hare LPN,  June 18, 2010 2:45 PM    Additional Follow-up for Phone Call Additional follow up Details #2::    called patient, no answer Follow-up by: Adella Hare LPN,  June 19, 2010 11:09 AM   Appended Document:  so far he hasn't gave her anything and when she gets home its too late to call his office  Appended Document:  I am a bit confused. She told me at her last OV that she had a prescription for Simvastatin but that she was waiting to finish the Crestor before she started taking it.  Please check with pts pharmacy and see if she has a prescription for Simvastatin, and if she has picked it up yet. Her cardiologist has been rxing her chol meds.  Appended Document:  HER PHARMACY HAS NO RECORD OF ANY CHOLESTEROL MED CHANGE  Appended Document:  Spoke with Dr Lonn [place] nurse 06/22/10.  She discussed chol medication change with Dr Katrinka Blazing.  He did not discuss changing pts chol medication with pt.  His recommendation is that she stay on Crestor 40mg .  He would not recommending changing her statin.  Appended Document:  Advise pt that I spoke with Dr Lonn [place] office ( her cardiologist).  He wants her to stay on Crestor.  Tell her I am sorry that this is expensive but it is the best choice for her, and is important for her to stay on this.  Appended Document:  called patient left message   Appended Document:  CALLED PATIENT, NO ANSWER  Appended Document:  patient aware

## 2010-11-25 NOTE — Progress Notes (Signed)
Summary: PRE HEART ATTACK  Phone Note Call from Patient   Summary of Call: SHE IS IN THE HOSPITAL SHE HAS HAD A PRE HEART ATTACK  HER MOTHER JUST PAST AND SHE AND HER BROTHER IS HAVING HEART ATTACKS. HER boyfriend CALLED   Initial call taken by: Lind Guest,  April 18, 2010 9:12 AM  Follow-up for Phone Call        Noted Follow-up by: Esperanza Sheets PA,  April 18, 2010 10:06 AM

## 2010-11-25 NOTE — Assessment & Plan Note (Signed)
Summary: F UP room 1   Vital Signs:  Patient profile:   60 year old female Height:      63.5 inches Weight:      127.25 pounds BMI:     22.27 O2 Sat:      99 % on Room air Pulse rate:   67 / minute Resp:     16 per minute BP sitting:   130 / 70  (left arm) CC: Follow up. Complains of headache, leg cramps and raw area between toe on left foot. Comments Did not bring meds   Primary Provider:  Garnette Czech, [place]  CC:  Follow up. Complains of headache and leg cramps and raw area between toe on left foot.Marland Kitchen  History of Present Illness: Pt presents today for check up. Hx of asthma.  Pt discontinued Symbicort inhaler because it is a $30 co pay.  She is using Ventolin inhaler two times a day on average.  Is taking Crestor, but has been getting samples from her cardiologist.  The cardiologist does not want to change her to a generic.  She is also having difficulty affording the $30 copay for this. Hx of htn. Taking medications as prescribed and denies side effects.   C/o HA and chest hurting with episodes of stress only.  Uses over the counter pain reliever and helps.  Still getting cramps in legs at night.  Doesnt sleep well at night.  Hx of anxiety. Takes Alprazolam at bedtime but still doesnt sleep well.  Pt c/o thick toenails.  She has difficulty trimming them. Also itchy rash on Lt foot btwn toes.  Flu vaccine at work. Last Tetnus vaccine uncertain.     Allergies (verified): No Known Drug Allergies  Past History:  Past medical history reviewed for relevance to current acute and chronic problems.  Past Medical History: Reviewed history from 05/06/2010 and no changes required. 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  We will manage her gynecology care.  Review of Systems General:  Denies chills and fever. CV:   Complains of chest pain or discomfort; denies palpitations. Resp:  Complains of cough and wheezing; denies shortness of breath and sputum productive. GI:  Denies abdominal pain, change in bowel habits, nausea, and vomiting. Derm:  Complains of dryness, itching, and rash.  Physical Exam  General:  Well-developed,well-nourished,in no acute distress; alert,appropriate and cooperative throughout examination Head:  Normocephalic and atraumatic without obvious abnormalities. No apparent alopecia or balding. Ears:  External ear exam shows no significant lesions or deformities.  Otoscopic examination reveals clear canals, tympanic membranes are intact bilaterally without bulging, retraction, inflammation or discharge. Hearing is grossly normal bilaterally. Nose:  External nasal examination shows no deformity or inflammation. Nasal mucosa are pink and moist without lesions or exudates. Mouth:  Oral mucosa and oropharynx without lesions or exudates.   Neck:  No deformities, masses, or tenderness noted. Lungs:  Normal respiratory effort, chest expands symmetrically. Lungs are clear to auscultation, no crackles or wheezes. Heart:  Normal rate and regular rhythm. S1 and S2 normal without gallop, murmur, click, rub or other extra sounds. Skin:  Bilat great toenails very thick and yellow.  Lt 1st interdigital space erythematous peeling and macerated.  Dry peeling also noted bilat heels. Cervical Nodes:  No lymphadenopathy noted Psych:  Cognition and judgment appear intact. Alert and cooperative with normal attention span and concentration. No apparent  delusions, illusions, hallucinations   Impression & Recommendations:  Problem # 1:  HYPERTENSION (ICD-401.9) Assessment Comment Only  Her updated medication list for this problem includes:    Lisinopril 5 Mg Tabs (Lisinopril) ..... One tab by mouth once daily    Hydrochlorothiazide 25 Mg Tabs (Hydrochlorothiazide) ..... One tab by mouth once  daily  Orders: T-Basic Metabolic Panel [phone])  Problem # 2:  ONYCHOMYCOSIS, TOENAILS (ICD-110.1) Assessment: Comment Only Also tinea pedis.  Her updated medication list for this problem includes:    Ketoconazole 2 % Crea (Ketoconazole) .Marland Kitchen... Apply two times a day to rash on feet  Orders: Podiatry Referral (Podiatry)  Problem # 3:  HYPERLIPIDEMIA (ICD-272.4) Assessment: Comment Only  Her updated medication list for this problem includes:    Crestor 40 Mg Tabs (Rosuvastatin calcium) .Marland Kitchen... Take 1 daily  Problem # 4:  LEG CRAMPS, NOCTURNAL (ICD-729.82) Assessment: Comment Only  Problem # 5:  ANXIETY (ICD-300.00) Assessment: Comment Only  The following medications were removed from the medication list:    Alprazolam 0.5 Mg Tabs (Alprazolam) .Marland Kitchen... At bedtime Her updated medication list for this problem includes:    Clonazepam 1 Mg Tabs (Clonazepam) .Marland Kitchen... Take one tablet at bedime  Complete Medication List: 1)  Lisinopril 5 Mg Tabs (Lisinopril) .... One tab by mouth once daily 2)  Hydrochlorothiazide 25 Mg Tabs (Hydrochlorothiazide) .... One tab by mouth once daily 3)  Aspir-low 81 Mg Tbec (Aspirin) .... One tab by mouth once daily 4)  Vitamin D (ergocalciferol) 50000 Unit Caps (Ergocalciferol) .... Take 1 weekly 5)  Nitrostat 0.4 Mg Subl (Nitroglycerin) .... As directed 6)  Crestor 40 Mg Tabs (Rosuvastatin calcium) .... Take 1 daily 7)  Symbicort 80-4.5 Mcg/act Aero (Budesonide-formoterol fumarate) .... Use 2 puffs two times a day 8)  Ventolin Hfa 108 (90 Base) Mcg/act Aers (Albuterol sulfate) .... Use 2 puffs every 4 hours as needed for shortness of breath or wheezing 9)  Clonazepam 1 Mg Tabs (Clonazepam) .... Take one tablet at bedime 10)  Ketoconazole 2 % Crea (Ketoconazole) .... Apply two times a day to rash on feet  Other Orders: Vit D, 25 [facility] ([id number]) Tdap => 27yrs IM (84132) Admin 1st Vaccine ([address]010)  Patient Instructions: 1)  Please schedule a  follow-up appointment in 3 months. 2)  Have blood work drawn today. 3)  Stop taking Alprazolam (Xanax).  I have changed this to Clonazepam to help you sleep and help with your leg cramps. 4)  Restart Symbicort inhaler two times a day. 5)  Continue Crestor. 6)  I have given you cards to help with your copays for each of these.  I have also given you a phone number to call to see if the drug company can help you with getting your Symbicort and Crestor. 7)  I am referring you to a podiatrist (foot doctor). Prescriptions: KETOCONAZOLE 2 % CREA (KETOCONAZOLE) apply two times a day to rash on feet  #30 grams x 0   Entered and Authorized by:   Esperanza Sheets PA   Signed by:   Esperanza Sheets PA on 08/12/2010   Method used:   [facility] to        [facility]ail[facility]       [address]       [place], [place]  [address]       Ph: [phone]       Fax: [phone]   RxID:   [id number] CLONAZEPAM 1 MG TABS (CLONAZEPAM) take one tablet at bedime  #  30 x 2   Entered and Authorized by:   Esperanza Sheets PA   Signed by:   Esperanza Sheets PA on 08/12/2010   Method used:   Printed then faxed to ...       [facility] (retail)       [address]       [place], [place]  [address]       Ph: [phone]       Fax: [phone]   RxID:   [id number]    Orders Added: 1)  T-Basic Metabolic Panel [id number] 2)  Vit D, 25 [facility] [phone] 3)  Tdap => 77yrs IM [[address]15] 4)  Admin 1st Vaccine [90471] 5)  Podiatry Referral [Podiatry] 6)  Est. Patient Level IV [[address]387]   Immunizations Administered:  Tetanus Vaccine:    Vaccine Type: Tdap    Site: right deltoid    Mfr: GlaxoSmithKline    Dose: 0.5 ml    Route: IM    Given by: Mauricia Area CMA    Exp. Date: 08/14/2012    Lot #: [id number]    VIS given: 09/12/08 version given August 12, 2010.   Immunizations Administered:  Tetanus Vaccine:    Vaccine Type: Tdap    Site: right deltoid    Mfr: GlaxoSmithKline    Dose: 0.5 ml     Route: IM    Given by: Mauricia Area CMA    Exp. Date: 08/14/2012    Lot #: [id number]    VIS given: 09/12/08 version given August 12, 2010.

## 2010-11-27 NOTE — Progress Notes (Signed)
Summary: please refax Vitamin D  Phone Note Call from Patient   Summary of Call: patient's husband called in and states patient's Vitamin D hasn't been called in.  I advised patient that it was sent electronically this morning.  I called [facility] and she states that they didn't get the fax.  Please refax the prescription. Thanks Initial call taken by: Curtis Sites,  November 12, 2010 3:06 PM    Prescriptions: VITAMIN D (ERGOCALCIFEROL) 50000 UNIT CAPS (ERGOCALCIFEROL) take 1 weekly  #4 x 2   Entered by:   Adella Hare LPN   Authorized by:   Syliva Overman MD   Signed by:   Adella Hare LPN on 29/56/2130   Method used:   Electronically to        [facility] (retail)       [address]       [place], [place]  [address]       Ph: [phone]       Fax: [phone]   RxID:   [id number]

## 2010-11-27 NOTE — Letter (Signed)
Summary: [facility]   Imported By: Lind Guest 11/05/2010 10:47:53  _____________________________________________________________________  External Attachment:    Type:   Image     Comment:   External Document

## 2010-11-27 NOTE — Letter (Signed)
Summary: DOSE INCREASE  DOSE INCREASE   Imported By: Lind Guest 11/19/2010 14:07:48  _____________________________________________________________________  External Attachment:    Type:   Image     Comment:   External Document

## 2010-11-27 NOTE — Progress Notes (Signed)
Summary: paperwork  Phone Note Call from Patient   Summary of Call: Lambert Mody, called in on the behalf of Mrs. Rawson requesting that we send a prescription of crestor and symbicort to a pharmacutical company along with her social security number.  I advised MR Lorin Picket, that we couldn't just send over information like that.  I tried to explain to him that we needed the information that he was reading from.  He is suppose to bring in so we can see what he is talking about. Initial call taken by: Curtis Sites,  October 03, 2010 3:39 PM  Follow-up for Phone Call        noted Follow-up by: Adella Hare LPN,  October 06, 2010 9:40 AM

## 2010-12-04 ENCOUNTER — Encounter: Payer: Self-pay | Admitting: Family Medicine

## 2010-12-05 ENCOUNTER — Encounter: Payer: Self-pay | Admitting: Family Medicine

## 2010-12-11 NOTE — Assessment & Plan Note (Signed)
Summary: ov   Vital Signs:  Patient profile:   60 year old female Height:      63.5 inches Weight:      129 pounds BMI:     22.57 Pulse rate:   51 / minute Pulse rhythm:   regular Resp:     16 per minute BP sitting:   160 / 86  Primary Care Provider:  Syliva Overman MD   History of Present Illness: Reports  that she has been fairly well.  Denies recent fever or chills. Denies sinus pressure, nasal congestion , ear pain or sore throat. Denies chest congestion, or cough productive of sputum. Denies chest pain, palpitations, PND, orthopnea or leg swelling. Denies abdominal pain, nausea, vomitting, diarrhea or constipation. Denies change in bowel movements or bloody stool. Denies dysuria , frequency, incontinence or hesitancy. Denies  joint pain, swelling, or reduced mobility. Denies headaches, vertigo, seizures. Denies depression, anxiety or insomnia. Denies  rash, lesions, or itch.     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  We will manage her gynecology care.  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 Eyes:  Denies discharge,  eye pain, and red eye. Psych:  Denies anxiety and depression. Endo:  Denies cold intolerance, excessive hunger, excessive thirst, and excessive urination. Heme:  Denies abnormal bruising, bleeding, and fevers. Allergy:  Denies hives or rash and itching eyes.  Physical Exam  General:  Well-developed,well-nourished,in no acute distress; alert,appropriate and cooperative throughout examination HEENT: No facial asymmetry,  EOMI, No sinus tenderness, TM's Clear, oropharynx  pink and moist.   Chest: Clear to auscultation bilaterally.  CVS: S1, S2, No murmurs, No S3.   Abd: Soft, Nontender.  MS: Adequate ROM spine, hips, shoulders and knees.  Ext: No edema.   CNS: CN 2-12 intact, power tone and sensation normal throughout.   Skin: Intact, no visible lesions or rashes.  Psych: Good eye contact, normal affect.  Memory intact, not anxious or depressed appearing.    Impression & Recommendations:  Problem # 1:  VITAMIN D DEFICIENCY (ICD-268.9) Assessment Improved  Problem # 2:  HYPERLIPIDEMIA (ICD-272.4) Assessment: Improved  Her updated medication list for this problem includes:    Crestor 40 Mg Tabs (Rosuvastatin calcium) .Marland Kitchen... Take 1 daily  Labs Reviewed: SGOT: 20 (08/05/2010)   SGPT: 13 (08/05/2010)   HDL:50 (08/05/2010), 44 (03/26/2010)  LDL:76 (08/05/2010), 161 (61/60/7371)  Chol:154 (08/05/2010), 232 (03/26/2010)  Trig:141 (08/05/2010), 135 (03/26/2010)  Problem # 3:  HYPERTENSION (ICD-401.9) Assessment: Deteriorated  The following medications were removed from the medication list:    Lisinopril 5 Mg Tabs (Lisinopril) ..... One tab by mouth once daily Her updated medication list for this problem  includes:    Hydrochlorothiazide 25 Mg Tabs (Hydrochlorothiazide) ..... One tab by mouth once daily    Lisinopril 10 Mg Tabs (Lisinopril) .Marland Kitchen... Take 1 tablet by mouth once a day dose increase effective 11/18/2010  BP today: 160/86 Prior BP: 130/70 (08/12/2010)  Labs Reviewed: K+:  3.8 (08/12/2010) Creat: : 0.71 (08/12/2010)   Chol: 154 (08/05/2010)   HDL: 50 (08/05/2010)   LDL: 76 (08/05/2010)   TG: 141 (08/05/2010)  Problem # 4:  ASTHMA (ICD-493.90) Assessment: Deteriorated  The following medications were removed from the medication list:    Symbicort 80-4.5 Mcg/act Aero (Budesonide-formoterol fumarate) ..... Use 2 puffs two times a day Her updated medication list for this problem includes:    Ventolin Hfa 108 (90 Base) Mcg/act Aers (Albuterol sulfate) ..... Use 2 puffs every 4 hours as needed for shortness of breath or wheezing    Advair Hfa 45-21 Mcg/act Aero (Fluticasone-salmeterol) ..... One puff twice daily  Complete Medication List: 1)  Hydrochlorothiazide 25 Mg Tabs (Hydrochlorothiazide) .... One tab by mouth once daily 2)  Aspir-low 81 Mg Tbec (Aspirin) .... One tab by mouth once daily 3)  Nitrostat 0.4 Mg Subl (Nitroglycerin) .... As directed 4)  Crestor 40 Mg Tabs (Rosuvastatin calcium) .... Take 1 daily 5)  Ventolin Hfa 108 (90 Base) Mcg/act Aers (Albuterol sulfate) .... Use 2 puffs every 4 hours as needed for shortness of breath or wheezing 6)  Ketoconazole 2 % Crea (Ketoconazole) .... Apply two times a day to rash on feet 7)  Advair Hfa 45-21 Mcg/act Aero (Fluticasone-salmeterol) .... One puff twice daily 8)  Temazepam 7.5 Mg Caps (Temazepam) .... Take 1 capsule by mouth at bedtime, for sleep 9)  Lisinopril 10 Mg Tabs (Lisinopril) .... Take 1 tablet by mouth once a day dose increase effective 11/18/2010  Patient Instructions: 1)  Please schedule a follow-up appointment in 2 months. 2)  New med for breathing , pls call us if it is too expensive 3)  New med for sleep, pls turn off TV , light and radio 4)  Stop the once weekly vit D when done. 5)  New is calcium with D one gel cap daily 6)  Dose increas in BP med effective today, take TWO of the 5mg  tabs lisinopril till done, then new script is for 10mg  one daily Prescriptions: LISINOPRIL 10 MG TABS  (LISINOPRIL) Take 1 tablet by mouth once a day dose increase effective 11/18/2010  #30 x 2   Entered and Authorized by:   Syliva Overman MD   Signed by:   Syliva Overman MD on 11/18/2010   Method used:   Printed then faxed to ...       [facility] (retail)       [address]       [place], [place]  [address]       Ph: [phone]       Fax: [phone]   RxID:   [id number] TEMAZEPAM 7.5 MG CAPS (TEMAZEPAM) Take 1 capsule by mouth at bedtime, for sleep  #30 x 2   Entered by:   Everitt Amber LPN   Authorized by:   Syliva Overman MD   Signed by:   Everitt Amber LPN on 40/07/2724   Method used:   Printed then faxed to ...       [facility] (retail)       [address]       [place], [place]  [address]       Ph:  [phone]       Fax: [phone]   RxID:   [id number] ADVAIR HFA 45-21 MCG/ACT AERO (FLUTICASONE-SALMETEROL) one puff twice daily  #1 x 3   Entered by:   Everitt Amber LPN   Authorized by:   Syliva Overman MD   Signed by:   Everitt Amber LPN on 40/07/2724   Method used:   Electronically to        [facility] (retail)       [address]       [place], [place]  [address]       Ph: [phone]       Fax: [phone]   RxID:   [id number]    Orders Added: 1)  Est. Patient Level IV [09323]

## 2010-12-11 NOTE — Letter (Signed)
Summary: [facility]  med[facility]   Imported By: Lind Guest 12/05/2010 09:35:15  _____________________________________________________________________  External Attachment:    Type:   Image     Comment:   External Document

## 2010-12-11 NOTE — Miscellaneous (Signed)
Summary: meds  Clinical Lists Changes  Medications: Removed medication of ADVAIR HFA 45-21 MCG/ACT AERO (FLUTICASONE-SALMETEROL) one puff twice daily Added new medication of SYMBICORT 80-4.5 MCG/ACT AERO (BUDESONIDE-FORMOTEROL FUMARATE) two puffs two times a day

## 2011-01-11 LAB — COMPREHENSIVE METABOLIC PANEL
ALT: 11 U/L (ref 0–35)
Albumin: 4.1 g/dL (ref 3.5–5.2)
BUN: 11 mg/dL (ref 6–23)
Calcium: 9.9 mg/dL (ref 8.4–10.5)
GFR calc Af Amer: >60 mL/min (ref >60)
Potassium: 3.6 mEq/L (ref 3.5–5.1)
Total Bilirubin: 0.6 mg/dL (ref 0.3–1.2)
Total Protein: 6.8 g/dL (ref 6.0–8.3)

## 2011-01-11 LAB — CK TOTAL AND CKMB (NOT AT ARMC)
CK, MB: 4.2 ng/mL — ABNORMAL HIGH (ref 0.3–4.0)
Relative Index: INVALID (ref 0.0–2.5)
Total CK: 51 U/L (ref 7–177)

## 2011-01-11 LAB — CARDIAC PANEL(CRET KIN+CKTOT+MB+TROPI)
CK, MB: 2.2 ng/mL (ref 0.3–4.0)
Relative Index: INVALID (ref 0.0–2.5)
Troponin I: 0.16 ng/mL — ABNORMAL HIGH (ref 0.00–0.06)
Troponin I: 0.17 ng/mL — ABNORMAL HIGH (ref 0.00–0.06)

## 2011-01-11 LAB — TSH: TSH: 1.722 u[IU]/mL (ref 0.350–4.500)

## 2011-01-11 LAB — BASIC METABOLIC PANEL
CO2: 30 mEq/L (ref 19–32)
Calcium: 9.4 mg/dL (ref 8.4–10.5)
Chloride: 106 mEq/L (ref 96–112)
Creatinine, Ser: 0.56 mg/dL (ref 0.4–1.2)
Creatinine, Ser: 0.66 mg/dL (ref 0.4–1.2)
GFR calc Af Amer: >60 mL/min (ref >60)
GFR calc non Af Amer: >60 mL/min (ref >60)
Potassium: 3.5 mEq/L (ref 3.5–5.1)
Sodium: 142 mEq/L (ref 135–145)

## 2011-01-11 LAB — DIFFERENTIAL
Basophils Relative: 1 % (ref 0–1)
Eosinophils Absolute: 0.3 10*3/uL (ref 0.0–0.7)
Lymphocytes Relative: 37 % (ref 12–46)
Lymphs Abs: 2.5 10*3/uL (ref 0.7–4.0)
Neutrophils Relative %: 45 % (ref 43–77)

## 2011-01-11 LAB — CBC
HCT: 45.5 % (ref 36.0–46.0)
Hemoglobin: 13.5 g/dL (ref 12.0–15.0)
Hemoglobin: 15.6 g/dL — ABNORMAL HIGH (ref 12.0–15.0)
MCH: 36.1 pg — ABNORMAL HIGH (ref 26.0–34.0)
MCV: 105.8 fL — ABNORMAL HIGH (ref 78.0–100.0)
Platelets: 185 10*3/uL (ref 150–400)
Platelets: 234 10*3/uL (ref 150–400)
RBC: 3.75 MIL/uL — ABNORMAL LOW (ref 3.87–5.11)
RBC: 4.3 MIL/uL (ref 3.87–5.11)
RDW: 12.8 % (ref 11.5–15.5)
WBC: 4.6 10*3/uL (ref 4.0–10.5)

## 2011-01-11 LAB — RAPID URINE DRUG SCREEN, HOSP PERFORMED
Amphetamines: NOT DETECTED
Barbiturates: NOT DETECTED
Benzodiazepines: NOT DETECTED
Tetrahydrocannabinol: NOT DETECTED

## 2011-01-11 LAB — POCT CARDIAC MARKERS: Myoglobin, poc: 52.4 ng/mL (ref 12–200)

## 2011-01-11 LAB — LIPID PANEL
Cholesterol: 198 mg/dL (ref 0–200)
HDL: 41 mg/dL (ref >39)
Total CHOL/HDL Ratio: 4.8 RATIO
VLDL: 20 mg/dL (ref 0–40)

## 2011-01-11 LAB — BRAIN NATRIURETIC PEPTIDE: Pro B Natriuretic peptide (BNP): 444 pg/mL — ABNORMAL HIGH (ref 0.0–100.0)

## 2011-01-11 LAB — PROTIME-INR
INR: 1.04 (ref 0.00–1.49)
Prothrombin Time: 13.5 seconds (ref 11.6–15.2)

## 2011-01-11 LAB — HEMOGLOBIN A1C
Hgb A1c MFr Bld: 5.4 % (ref <5.7)
Mean Plasma Glucose: 108 mg/dL (ref <117)

## 2011-01-11 LAB — MRSA PCR SCREENING: MRSA by PCR: NEGATIVE

## 2011-01-12 LAB — CREATININE, SERUM
Creatinine, Ser: 0.68 mg/dL (ref 0.4–1.2)
GFR calc Af Amer: >60 mL/min (ref >60)
GFR calc non Af Amer: >60 mL/min (ref >60)

## 2011-01-28 ENCOUNTER — Encounter: Payer: Self-pay | Admitting: Family Medicine

## 2011-02-02 ENCOUNTER — Ambulatory Visit: Payer: Self-pay | Admitting: Family Medicine

## 2011-02-03 LAB — BASIC METABOLIC PANEL
BUN: 11 mg/dL (ref 6–23)
Calcium: 9.6 mg/dL (ref 8.4–10.5)
GFR calc non Af Amer: >60 mL/min (ref >60)
Glucose, Bld: 94 mg/dL (ref 70–99)
Sodium: 139 mEq/L (ref 135–145)

## 2011-02-03 LAB — PROTIME-INR: Prothrombin Time: 12.5 seconds (ref 11.6–15.2)

## 2011-02-03 LAB — CBC
Hemoglobin: 15.3 g/dL — ABNORMAL HIGH (ref 12.0–15.0)
Platelets: 222 10*3/uL (ref 150–400)
RDW: 12.1 % (ref 11.5–15.5)
WBC: 5.9 10*3/uL (ref 4.0–10.5)

## 2011-02-03 LAB — DIFFERENTIAL
Basophils Absolute: 0.1 10*3/uL (ref 0.0–0.1)
Lymphocytes Relative: 29 % (ref 12–46)
Lymphs Abs: 1.7 10*3/uL (ref 0.7–4.0)
Neutro Abs: 3.5 10*3/uL (ref 1.7–7.7)

## 2011-02-03 LAB — APTT: aPTT: 28 seconds (ref 24–37)

## 2011-02-03 LAB — GLUCOSE, CAPILLARY: Glucose-Capillary: 86 mg/dL (ref 70–99)

## 2011-02-04 LAB — POCT CARDIAC MARKERS
CKMB, poc: <1 ng/mL — ABNORMAL LOW (ref 1.0–8.0)
Myoglobin, poc: 48.6 ng/mL (ref 12–200)
Troponin i, poc: <0.05 ng/mL (ref 0.00–0.09)

## 2011-02-04 LAB — CBC
MCV: 101.9 fL — ABNORMAL HIGH (ref 78.0–100.0)
Platelets: 221 10*3/uL (ref 150–400)
WBC: 4.9 10*3/uL (ref 4.0–10.5)

## 2011-02-04 LAB — DIFFERENTIAL
Eosinophils Absolute: 0.2 10*3/uL (ref 0.0–0.7)
Lymphocytes Relative: 36 % (ref 12–46)
Lymphs Abs: 1.7 10*3/uL (ref 0.7–4.0)
Neutro Abs: 2.4 10*3/uL (ref 1.7–7.7)
Neutrophils Relative %: 49 % (ref 43–77)

## 2011-02-04 LAB — POCT I-STAT, CHEM 8
Calcium, Ion: 1.08 mmol/L — ABNORMAL LOW (ref 1.12–1.32)
HCT: 43 % (ref 36.0–46.0)
Hemoglobin: 14.6 g/dL (ref 12.0–15.0)
Sodium: 141 mEq/L (ref 135–145)
TCO2: 32 mmol/L (ref 0–100)

## 2011-02-04 LAB — URINALYSIS, ROUTINE W REFLEX MICROSCOPIC
Bilirubin Urine: NEGATIVE
Glucose, UA: NEGATIVE mg/dL
Ketones, ur: NEGATIVE mg/dL
Specific Gravity, Urine: <1.005 — ABNORMAL LOW (ref 1.005–1.030)
pH: 6.5 (ref 5.0–8.0)

## 2011-02-18 ENCOUNTER — Encounter: Payer: Self-pay | Admitting: Family Medicine

## 2011-02-20 ENCOUNTER — Encounter: Payer: Self-pay | Admitting: Family Medicine

## 2011-02-20 ENCOUNTER — Emergency Department (HOSPITAL_COMMUNITY)
Admission: EM | Admit: 2011-02-20 | Discharge: 2011-02-20 | Disposition: A | Discharge status: Home / Self Care | Payer: PRIVATE HEALTH INSURANCE | Attending: Emergency Medicine | Admitting: Emergency Medicine

## 2011-02-20 ENCOUNTER — Ambulatory Visit (INDEPENDENT_AMBULATORY_CARE_PROVIDER_SITE_OTHER): Payer: PRIVATE HEALTH INSURANCE | Admitting: Family Medicine

## 2011-02-20 VITALS — BP 170/84 | HR 57 | Resp 16 | Wt 123.4 lb

## 2011-02-20 DIAGNOSIS — F411 Generalized anxiety disorder: Secondary | ICD-10-CM | POA: Insufficient documentation

## 2011-02-20 DIAGNOSIS — R109 Unspecified abdominal pain: Secondary | ICD-10-CM | POA: Insufficient documentation

## 2011-02-20 DIAGNOSIS — J45909 Unspecified asthma, uncomplicated: Secondary | ICD-10-CM | POA: Insufficient documentation

## 2011-02-20 DIAGNOSIS — I10 Essential (primary) hypertension: Secondary | ICD-10-CM | POA: Insufficient documentation

## 2011-02-20 DIAGNOSIS — N390 Urinary tract infection, site not specified: Secondary | ICD-10-CM | POA: Insufficient documentation

## 2011-02-20 DIAGNOSIS — Z79899 Other long term (current) drug therapy: Secondary | ICD-10-CM | POA: Insufficient documentation

## 2011-02-20 LAB — CBC
Hemoglobin: 15.9 g/dL — ABNORMAL HIGH (ref 12.0–15.0)
MCH: 36.1 pg — ABNORMAL HIGH (ref 26.0–34.0)
MCHC: 34.7 g/dL (ref 30.0–36.0)
MCV: 103.9 fL — ABNORMAL HIGH (ref 78.0–100.0)
RBC: 4.41 MIL/uL (ref 3.87–5.11)

## 2011-02-20 LAB — URINALYSIS, ROUTINE W REFLEX MICROSCOPIC
Bilirubin Urine: NEGATIVE
Nitrite: POSITIVE — AB
Protein, ur: NEGATIVE mg/dL
Specific Gravity, Urine: 1.01 (ref 1.005–1.030)
Urobilinogen, UA: 0.2 mg/dL (ref 0.0–1.0)

## 2011-02-20 LAB — BASIC METABOLIC PANEL
BUN: 11 mg/dL (ref 6–23)
CO2: 30 mEq/L (ref 19–32)
Calcium: 9.5 mg/dL (ref 8.4–10.5)
Chloride: 99 mEq/L (ref 96–112)
Creatinine, Ser: 0.7 mg/dL (ref 0.4–1.2)
GFR calc Af Amer: >60 mL/min (ref >60)
Glucose, Bld: 89 mg/dL (ref 70–99)

## 2011-02-20 LAB — DIFFERENTIAL
Basophils Relative: 1 % (ref 0–1)
Lymphs Abs: 1.9 10*3/uL (ref 0.7–4.0)
Monocytes Absolute: 0.8 10*3/uL (ref 0.1–1.0)
Monocytes Relative: 12 % (ref 3–12)
Neutro Abs: 4 10*3/uL (ref 1.7–7.7)
Neutrophils Relative %: 58 % (ref 43–77)

## 2011-02-20 LAB — URINE MICROSCOPIC-ADD ON

## 2011-02-20 MED ORDER — LISINOPRIL 20 MG PO TABS: 20.0000 mg | ORAL_TABLET | Freq: Every day | ORAL | Status: DC

## 2011-02-20 NOTE — Progress Notes (Signed)
  Subjective:    Patient ID: Angela Duncan, female    DOB: [birth year], 60 y.o.   MRN: [id number]  HPI 3 day h/o RUQ pain and bilateral flank pain, sometimes radiating to groin a 10 plus, states once she had bloody looking  Urine, she has had chills.No documented fever. No GI symptoms     Review of Systems Denies recent fever or chills. Denies sinus pressure, nasal congestion, ear pain or sore throat. Denies chest congestion, productive cough or wheezing. Denies chest pains, palpitations, paroxysmal nocturnal dyspnea, orthopnea and leg swelling Denies abdominal pain, nausea, vomiting,diarrhea or constipation.  Denies rectal bleeding or change in bowel movement. Denies dysuria, frequency, hesitancy or incontinence.       Objective:   Physical Exam Patient alert and oriented and in no Cardiopulmonary distress.In severe pain, rated at a 10 plus, localised primarily to right flank  HEENT: No facial asymmetry, EOMI, no sinus tenderness, TM's clear, Oropharynx pink and moist.  Neck supple no adenopathy.  Chest: Clear to auscultation bilaterally.  CVS: S1, S2 no murmurs, no S3.  ABD: Soft  Epigastric and RUQ tenderness, marked right flank tenderness Ext: No edema  MS: Adequate ROM spine, shoulders, hips and knees.  Skin: Intact, no ulcerations or rash noted.  Psych: Good eye contact, normal affect. Memory intact not anxious or depressed appearing.  CNS: CN 2-12 intact, power, tone and sensation normal throughout.        Assessment & Plan:

## 2011-02-20 NOTE — Patient Instructions (Addendum)
F/U as before.  Dose increase on your blood pressure pill : lisinoprilis increased to 20mg  one daily from 10mg  one daily   You are being referred to the ED for evaluation of abdominal pain

## 2011-02-22 ENCOUNTER — Encounter: Payer: Self-pay | Admitting: Family Medicine

## 2011-02-22 DIAGNOSIS — R109 Unspecified abdominal pain: Secondary | ICD-10-CM | POA: Insufficient documentation

## 2011-02-22 NOTE — Assessment & Plan Note (Signed)
Severe and new, differentials include rena;l colic or pyelonephritis, ED eval and treatment , discussed with MD

## 2011-02-22 NOTE — Assessment & Plan Note (Signed)
Uncontrolled, increased medication dose

## 2011-02-23 LAB — URINE CULTURE

## 2011-03-02 ENCOUNTER — Telehealth: Payer: Self-pay | Admitting: Family Medicine

## 2011-03-02 NOTE — Telephone Encounter (Signed)
Advised urgent care, patient aware

## 2011-03-10 NOTE — Op Note (Signed)
NAME:  Angela Duncan, Angela Duncan NO.:  [id number]   MEDICAL RECORD NO.:  [id number]          PATIENT TYPE:  INP   LOCATION:  3017                         FACILITY:  [facility]   PHYSICIAN:  Tia Alert, MD     DATE OF BIRTH:  13-Dec-1950   DATE OF PROCEDURE:  03/07/2009  DATE OF DISCHARGE:                               OPERATIVE REPORT   PREOPERATIVE DIAGNOSIS:  Hydrocephalus.   POSTOPERATIVE DIAGNOSIS:  Hydrocephalus.   PROCEDURE:  Left ventriculoperitoneal shunt placement with a Hakim  programmable valve set at 140 mm of water.   SURGEON:  Tia Alert, MD   ASSISTANT:  Reinaldo Meeker, MD   ANESTHESIA:  General endotracheal.   COMPLICATIONS:  None apparent.   INDICATIONS FOR PROCEDURE:  [name] is a 60 year old female who is  referred with headaches.  She was found on CT scan to have large  ventricles consistent with hydrocephalus.  I recommended  ventriculoperitoneal shunting.  She understood the risks, benefits and  its expected outcome and wished to proceed.   DESCRIPTION OF THE PROCEDURE:  The patient was taken to the operating  room after induction of adequate generalized endotracheal anesthesia.  She was placed in supine position on the operating room table.  She had  a large right upper quadrant cholecystectomy scar, and therefore we felt  it was best to go on the left side.  Therefore, we prepared her for left-  sided shunt by shaving the left parietal-occipital region.  We then  turned her head to the right and prepped her with Betadine paint and  scrub.  She was then draped in the usual sterile fashion.  A left  abdominal incision was made by Dr. Gerlene Fee.  He carried the incision  down through the rectus sheath, identified the peritoneum, and placed  hemostats on these.  I made an incision in the region of the superior  parietal lobule, reflected this forward, and then drilled a small bur  hole with the high-speed drill.  A pocket was created for  the valve.  A  shunt passer was passed from the cranial incision to the abdominal  incision and then the unitized tubing was passed cranially to caudally  with the valve in place.  We then coagulated the dura and opened this,  coagulated the underlying cerebrum, and then passed the shunt tubing  into the left lateral ventricle reaching the ventricle at a depth of  about 3 cm.  We then passed this to a depth of 9 cm, cut it here, and  tied it to the valve.  We then pulled the valve into its pocket and made  sure we had good flow distally.  We then passed the distal catheter into  the peritoneal space.  We then placed a pursestring in the peritoneum  and closed the rectus sheath with 2-0 Vicryl, closed the subcutaneous  tissue with 2-0 Vicryl, closed the subcuticular tissue with 3-0 Vicryl,  and closed the skin with benzoin and Steri-Strips.  The galea was closed  with 2-0 Vicryl and the skin was closed with staples over  the cranial  incision.  Sterile dressings were applied.  The patient was then awakened from  general anesthesia and transferred to the recovery room in stable  condition.  At the end of the procedure, all sponge, needle, and  instrument counts were correct.      Tia Alert, MD  Electronically Signed     DSJ/MEDQ  D:  03/07/2009  T:  03/08/2009  Job:  [id number]

## 2011-03-17 ENCOUNTER — Other Ambulatory Visit: Payer: Self-pay | Admitting: Family Medicine

## 2011-03-17 DIAGNOSIS — Z139 Encounter for screening, unspecified: Secondary | ICD-10-CM

## 2011-04-01 ENCOUNTER — Encounter: Payer: Self-pay | Admitting: Family Medicine

## 2011-04-03 ENCOUNTER — Encounter: Payer: Self-pay | Admitting: Family Medicine

## 2011-04-03 ENCOUNTER — Ambulatory Visit (INDEPENDENT_AMBULATORY_CARE_PROVIDER_SITE_OTHER): Payer: PRIVATE HEALTH INSURANCE | Admitting: Family Medicine

## 2011-04-03 VITALS — BP 158/80 | HR 56 | Resp 16 | Ht 63.5 in | Wt 117.0 lb

## 2011-04-03 DIAGNOSIS — J45909 Unspecified asthma, uncomplicated: Secondary | ICD-10-CM

## 2011-04-03 DIAGNOSIS — R7301 Impaired fasting glucose: Secondary | ICD-10-CM

## 2011-04-03 DIAGNOSIS — J209 Acute bronchitis, unspecified: Secondary | ICD-10-CM | POA: Insufficient documentation

## 2011-04-03 DIAGNOSIS — R5381 Other malaise: Secondary | ICD-10-CM

## 2011-04-03 DIAGNOSIS — G4700 Insomnia, unspecified: Secondary | ICD-10-CM

## 2011-04-03 DIAGNOSIS — E559 Vitamin D deficiency, unspecified: Secondary | ICD-10-CM

## 2011-04-03 DIAGNOSIS — J45901 Unspecified asthma with (acute) exacerbation: Secondary | ICD-10-CM

## 2011-04-03 DIAGNOSIS — E785 Hyperlipidemia, unspecified: Secondary | ICD-10-CM

## 2011-04-03 DIAGNOSIS — R5383 Other fatigue: Secondary | ICD-10-CM

## 2011-04-03 DIAGNOSIS — I10 Essential (primary) hypertension: Secondary | ICD-10-CM

## 2011-04-03 LAB — HEPATIC FUNCTION PANEL
ALT: 11 U/L (ref 0–35)
AST: 18 U/L (ref 0–37)
Bilirubin, Direct: 0.1 mg/dL (ref 0.0–0.3)
Indirect Bilirubin: 0.6 mg/dL (ref 0.0–0.9)
Total Bilirubin: 0.7 mg/dL (ref 0.3–1.2)

## 2011-04-03 LAB — HEMOGLOBIN A1C
Hgb A1c MFr Bld: 5.7 % — ABNORMAL HIGH (ref <5.7)
Mean Plasma Glucose: 117 mg/dL — ABNORMAL HIGH (ref <117)

## 2011-04-03 LAB — BASIC METABOLIC PANEL
BUN: 12 mg/dL (ref 6–23)
CO2: 31 mEq/L (ref 19–32)
Calcium: 10.1 mg/dL (ref 8.4–10.5)
Glucose, Bld: 86 mg/dL (ref 70–99)
Sodium: 137 mEq/L (ref 135–145)

## 2011-04-03 LAB — TSH: TSH: 1.042 u[IU]/mL (ref 0.350–4.500)

## 2011-04-03 LAB — LIPID PANEL
Cholesterol: 136 mg/dL (ref 0–200)
Total CHOL/HDL Ratio: 3 Ratio

## 2011-04-03 MED ORDER — BENZONATATE 100 MG PO CAPS: 100.0000 mg | ORAL_CAPSULE | Freq: Three times a day (TID) | ORAL | Status: DC | PRN

## 2011-04-03 MED ORDER — SULFAMETHOXAZOLE-TRIMETHOPRIM 800-160 MG PO TABS: 1.0000 | ORAL_TABLET | Freq: Two times a day (BID) | ORAL | Status: AC

## 2011-04-03 MED ORDER — LISINOPRIL 40 MG PO TABS: 40.0000 mg | ORAL_TABLET | Freq: Every day | ORAL | Status: DC

## 2011-04-03 NOTE — Patient Instructions (Addendum)
F/u in  2to 3  months.  You are being treated for bronchitis Your blood pressure is too high, increase dose of zestril to 40mg  once daily (OK to take two 20mg  tabs till done)  HBA1C and TSH today, lipid, hepatic and chem 7 today

## 2011-04-03 NOTE — Progress Notes (Signed)
  Subjective:    Patient ID: Angela Duncan, female    DOB: 08-22-1951, 60 y.o.   MRN: [id number]  HPI Cough productive of yellow green sputum sputum x several weeks,m she has also noted increased chest tightness with cough and shortness of breath.she denies sinus pressure, nasal congestion, sore throat or ear pain.She replorts having to use her rescue proventil more often in the past week , due to wheeze. She recently had dose increase on her antihypertensive, is here for re-eval of this, denies any adverse side effects   Review of Systems See HPI  Denies chest pains, palpitations, paroxysmal nocturnal dyspnea, orthopnea and leg swelling Denies abdominal pain, nausea, vomiting,diarrhea or constipation.  Denies rectal bleeding or change in bowel movement. Denies dysuria, frequency, hesitancy or incontinence. Denies joint pain, swelling and limitation in mobility. Denies headaches, seizure, numbness, or tingling. Denies depression, anxiety or insomnia.Good result with restoril Denies skin break down or rash.        Objective:   Physical Exam Patient alert and oriented and in no Cardiopulmonary distress.  HEENT: No facial asymmetry, EOMI, no sinus tenderness, TM's clear, Oropharynx pink and moist.  Neck supple no adenopathy.  Chest: decreased air entry, scattered bilateral crackles, no wheezes CVS: S1, S2 no murmurs, no S3.  ABD: Soft non tender. Bowel sounds normal.  Ext: No edema  MS: Adequate ROM spine, shoulders, hips and knees.  Skin: Intact, no ulcerations or rash noted.  Psych: Good eye contact, normal affect. Memory intact not anxious or depressed appearing.  CNS: CN 2-12 intact, power, tone and sensation normal throughout.        Assessment & Plan:

## 2011-04-04 DIAGNOSIS — G4700 Insomnia, unspecified: Secondary | ICD-10-CM | POA: Insufficient documentation

## 2011-04-04 NOTE — Assessment & Plan Note (Signed)
Generally controlled on medication

## 2011-04-04 NOTE — Assessment & Plan Note (Signed)
Controlled, no change in medication  

## 2011-04-04 NOTE — Assessment & Plan Note (Signed)
Medication compliance addressed. Commitment to regular exercise and healthy  food choices, with portion control discussed. DASH diet and low fat diet discussed and literature offered. Changes in medication made at this visit.  

## 2011-04-04 NOTE — Assessment & Plan Note (Signed)
Acute bronchitis is managed with antibiotics , her asthma is not currently controlled due to acute infection

## 2011-04-04 NOTE — Assessment & Plan Note (Signed)
Controlled on med , sleep hygiene discussed also

## 2011-04-07 ENCOUNTER — Ambulatory Visit (HOSPITAL_COMMUNITY)
Admission: RE | Admit: 2011-04-07 | Discharge: 2011-04-07 | Disposition: A | Discharge status: Home / Self Care | Payer: PRIVATE HEALTH INSURANCE | Source: Ambulatory Visit | Attending: Family Medicine | Admitting: Family Medicine

## 2011-04-07 DIAGNOSIS — Z139 Encounter for screening, unspecified: Secondary | ICD-10-CM

## 2011-04-07 DIAGNOSIS — Z1231 Encounter for screening mammogram for malignant neoplasm of breast: Secondary | ICD-10-CM | POA: Insufficient documentation

## 2011-04-10 ENCOUNTER — Other Ambulatory Visit: Payer: Self-pay | Admitting: Family Medicine

## 2011-04-10 DIAGNOSIS — R928 Other abnormal and inconclusive findings on diagnostic imaging of breast: Secondary | ICD-10-CM

## 2011-05-06 ENCOUNTER — Other Ambulatory Visit (HOSPITAL_COMMUNITY): Payer: Self-pay | Admitting: Family Medicine

## 2011-05-06 ENCOUNTER — Ambulatory Visit (HOSPITAL_COMMUNITY)
Admission: RE | Admit: 2011-05-06 | Discharge: 2011-05-06 | Disposition: A | Discharge status: Home / Self Care | Payer: PRIVATE HEALTH INSURANCE | Source: Ambulatory Visit | Attending: Family Medicine | Admitting: Family Medicine

## 2011-05-06 DIAGNOSIS — R928 Other abnormal and inconclusive findings on diagnostic imaging of breast: Secondary | ICD-10-CM | POA: Insufficient documentation

## 2011-06-12 ENCOUNTER — Ambulatory Visit: Payer: PRIVATE HEALTH INSURANCE | Admitting: Family Medicine

## 2011-06-24 IMAGING — CT CT HEAD W/O CM
1 series · 15 of 28 positions shown, 19 images · non-contrast
Comparison: [DATE]

CLINICAL DATA: Follow-up shunt placement

CT HEAD WITHOUT CONTRAST
TECHNIQUE: Contiguous axial images were obtained from the base of
the skull through the vertex without contrast.

[Series 32: 3d filtered head · axial · 0.49mm/px · z∈[+29,+165]mm · 15 of 28 slices shown, 19 images]
[im 2/28  brain]
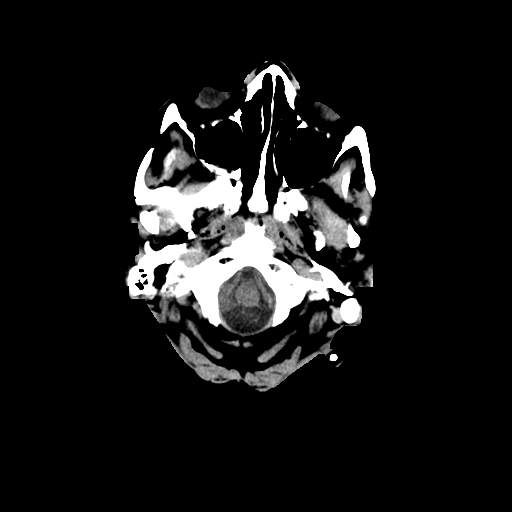
[im 2/28  bone]
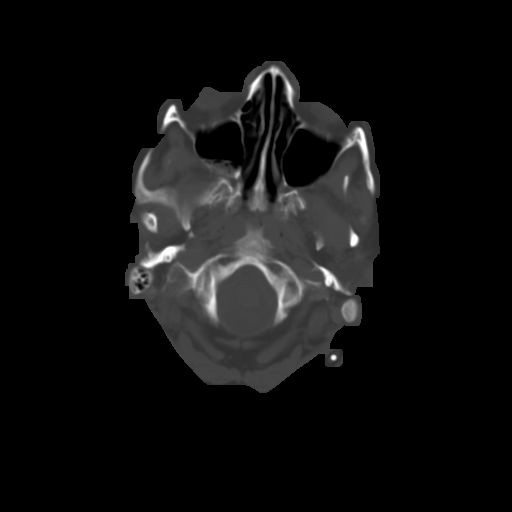
[im 4/28  brain]
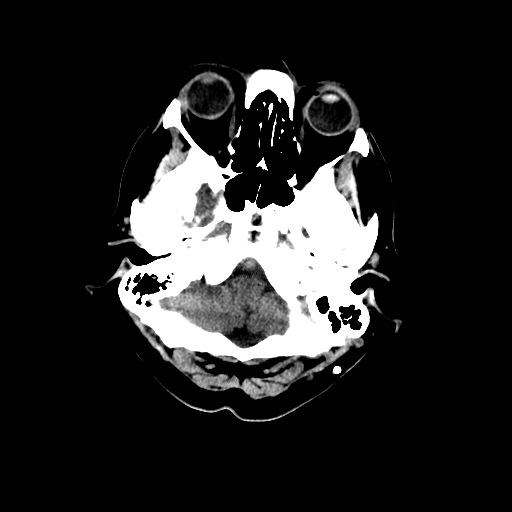
[im 6/28  brain]
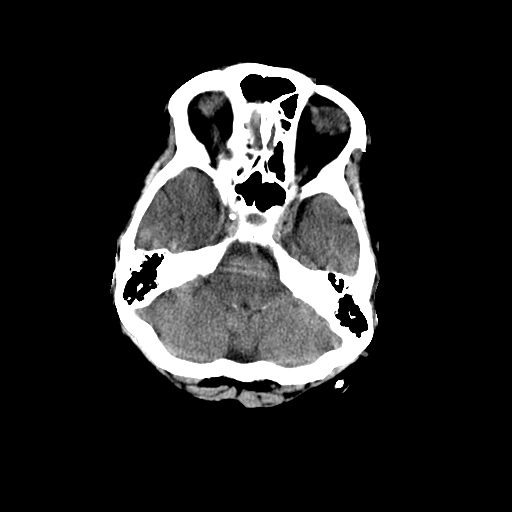
[im 8/28  brain]
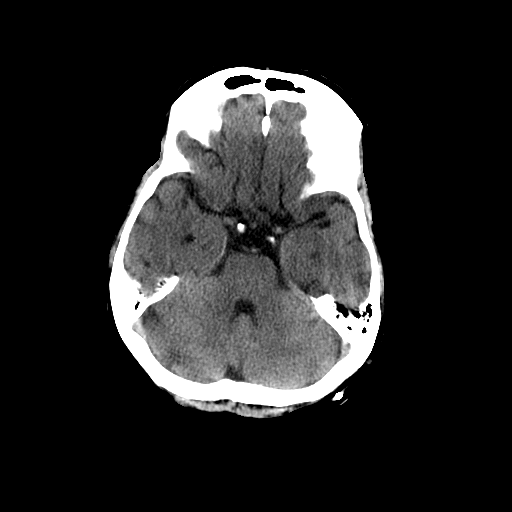
[im 9/28  brain]
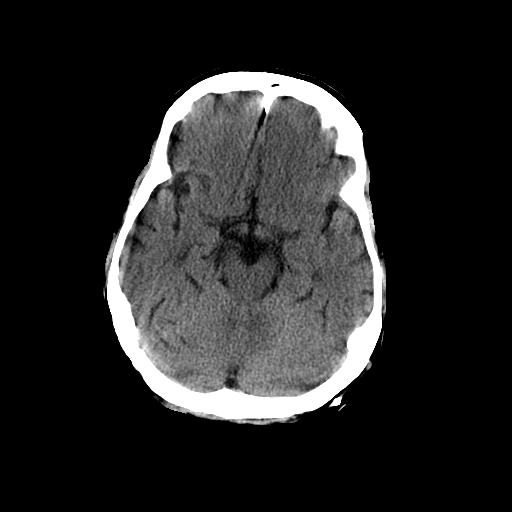
[im 9/28  bone]
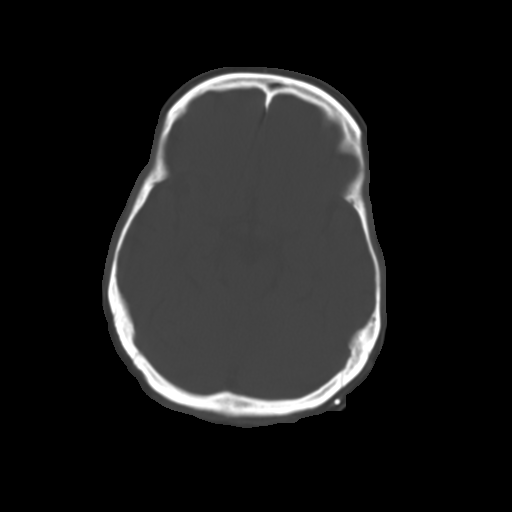
[im 11/28  brain]
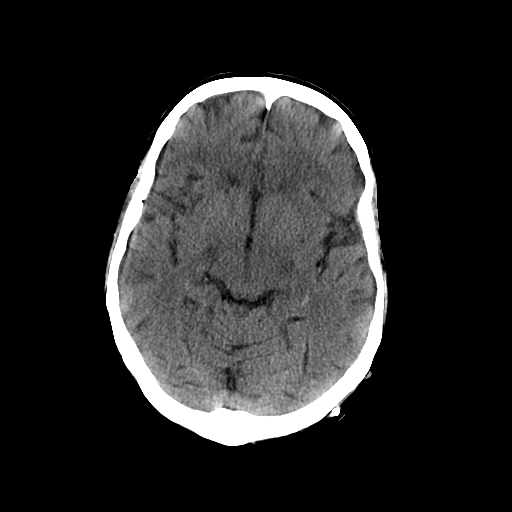
[im 13/28  brain]
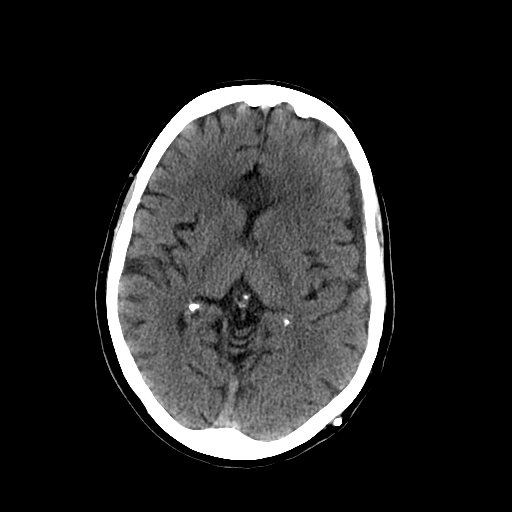
[im 15/28  brain]
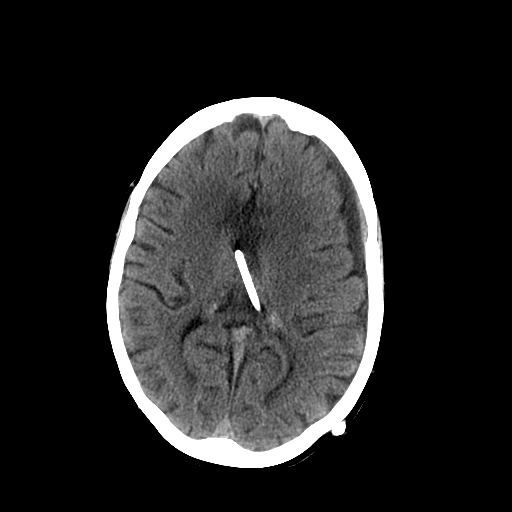
[im 16/28  brain]
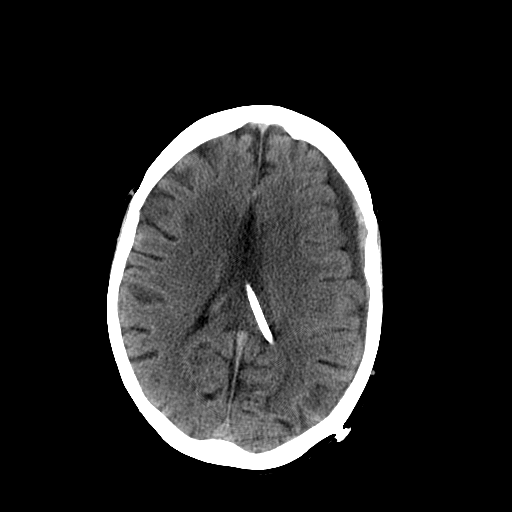
[im 16/28  bone]
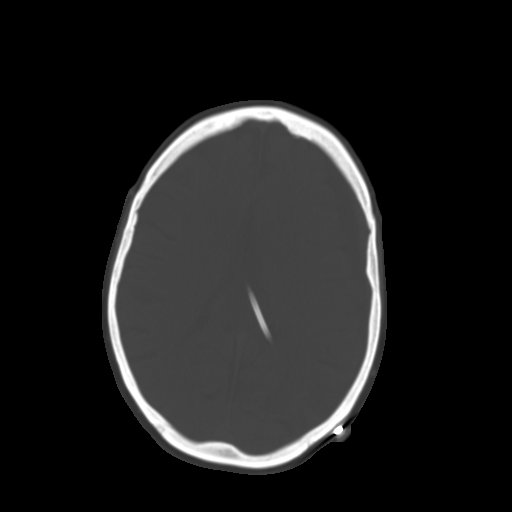
[im 18/28  brain]
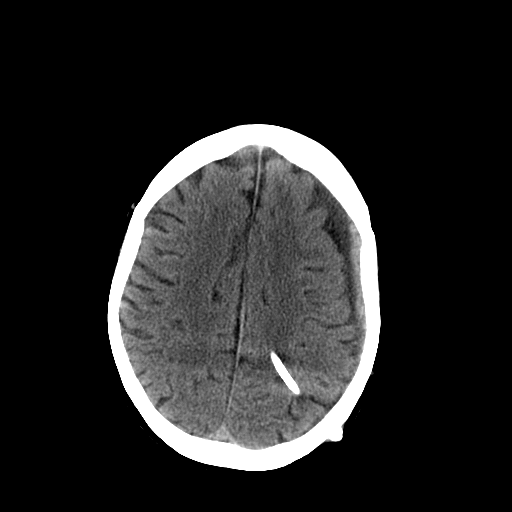
[im 20/28  brain]
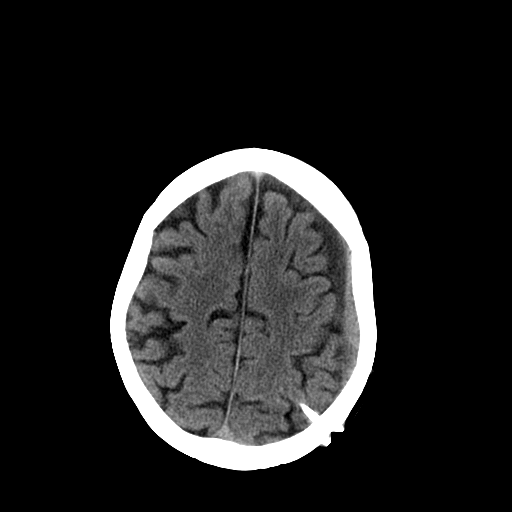
[im 21/28  brain]
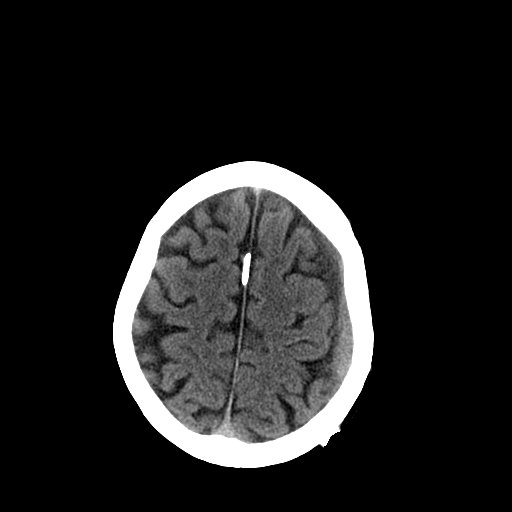
[im 23/28  brain]
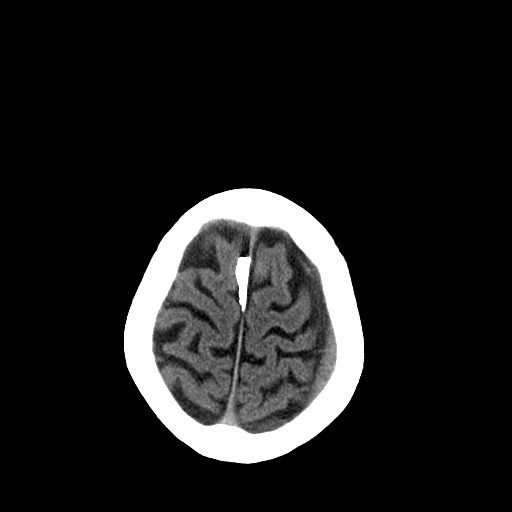
[im 23/28  bone]
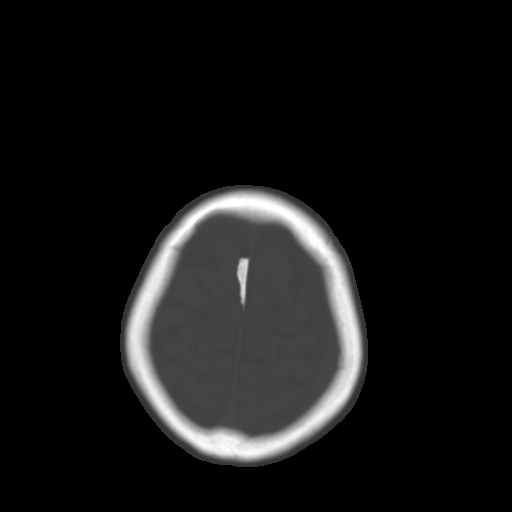
[im 25/28  brain]
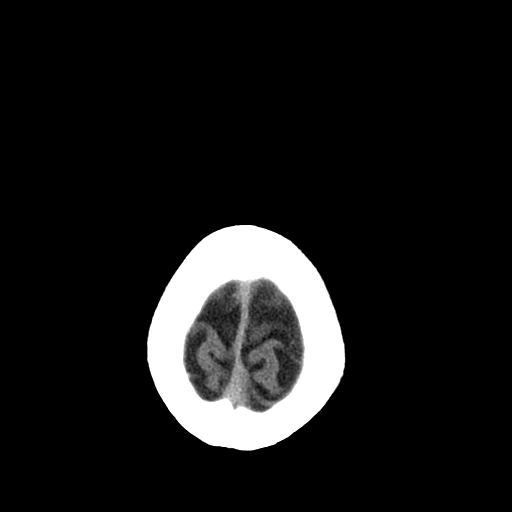
[im 27/28  brain]
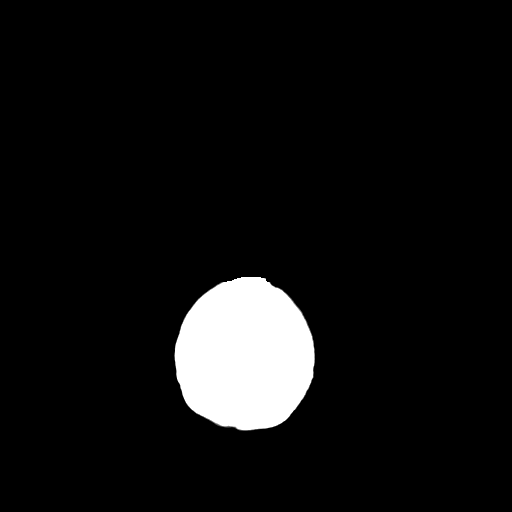

[15 of 28 positions shown; findings below may reference images not displayed]

FINDINGS: Left-sided ventricular shunt remains in place, entering
the occipital horn/atrium of the left lateral ventricle.
Ventricles are more diminutive than were seen previously.  Possible
there could be over shunting.  Ventricles are essentially slit-
like.  Subdural material on the left is again noted, with what
appears to be a small recurrent acute or subacute component .
Maximal thickness is on the order of 9 mm.  No midline shift.
There continues to be abnormal low density associated with the
corpus callosum, particularly in the region of the genu.  I do not
see any abnormality in this region on the MRI scan [DATE], but
this area appeared abnormal on subsequent CT exams.  Therefore, MRI
might be considered to evaluate the nature of this finding.  The
possibilities include infarction and tumor.  Tumor would seem
unlikely given the appearance [DATE].
IMPRESSION: Ventricular system is slit-like, possibly suggesting over shunting.
Left sided subdural material is slightly more voluminous, maximal
thickness now 9 mm.  There also appears to be some acute or
subacute blood admixed with the more chronic appearing low density
material.

Abnormal low density within the genu of the corpus callosum and
possibly within the body of the corpus callosum.  This was not seen
on the MRI scan [DATE] but was present on the subsequent CTs.
Nature is uncertain, but this could represent infarction.

## 2011-06-25 ENCOUNTER — Encounter: Payer: Self-pay | Admitting: Family Medicine

## 2011-06-26 ENCOUNTER — Encounter: Payer: Self-pay | Admitting: Family Medicine

## 2011-06-26 ENCOUNTER — Ambulatory Visit (INDEPENDENT_AMBULATORY_CARE_PROVIDER_SITE_OTHER): Payer: PRIVATE HEALTH INSURANCE | Admitting: Family Medicine

## 2011-06-26 VITALS — BP 170/90 | HR 56 | Resp 16 | Ht 63.5 in | Wt 117.1 lb

## 2011-06-26 DIAGNOSIS — E785 Hyperlipidemia, unspecified: Secondary | ICD-10-CM

## 2011-06-26 DIAGNOSIS — I10 Essential (primary) hypertension: Secondary | ICD-10-CM

## 2011-06-26 DIAGNOSIS — J45909 Unspecified asthma, uncomplicated: Secondary | ICD-10-CM

## 2011-06-26 DIAGNOSIS — Z2911 Encounter for prophylactic immunotherapy for respiratory syncytial virus (RSV): Secondary | ICD-10-CM

## 2011-06-26 DIAGNOSIS — Z23 Encounter for immunization: Secondary | ICD-10-CM

## 2011-06-26 MED ORDER — AMLODIPINE BESYLATE 5 MG PO TABS: 5.0000 mg | ORAL_TABLET | Freq: Every day | ORAL | Status: DC

## 2011-06-26 NOTE — Patient Instructions (Addendum)
F/u in 7 weeks.  LABWORK  NEEDS TO BE DONE BETWEEN 3 TO 7 DAYS BEFORE YOUR NEXT SCEDULED  VISIT.  THIS WILL IMPROVE THE QUALITY OF YOUR CARE.  Fasting lipid, hepatic and chem 7 before next visit.   Blood pressure is high , need to take an additional medication, amlodipine today for this  It is important that you exercise regularly at least 30 minutes 5 times a week. If you develop chest pain, have severe difficulty breathing, or feel very tired, stop exercising immediately and seek medical attention   Shingles vaccine  today

## 2011-07-03 NOTE — Assessment & Plan Note (Signed)
Controlled, no change in medication  

## 2011-07-03 NOTE — Progress Notes (Signed)
  Subjective:    Patient ID: Angela Duncan, female    DOB: [birth year], 60 y.o.   MRN: [id number]  HPI The PT is here for follow up and re-evaluation of chronic medical conditions, medication management and review of any available recent lab and radiology data.  Preventive health is updated, specifically  Cancer screening and Immunization.   Questions or concerns regarding consultations or procedures which the PT has had in the interim are  Addressed. She was recently at her cardiologist's office and her bP reportedly was also high at that time The PT denies any adverse reactions to current medications since the last visit.  There are no new concerns.  There are no specific complaints       Review of Systems Denies recent fever or chills. Denies sinus pressure, nasal congestion, ear pain or sore throat. Denies chest congestion, productive cough or wheezing. Denies chest pains, palpitations and leg swelling Denies abdominal pain, nausea, vomiting,diarrhea or constipation.   Denies dysuria, frequency, hesitancy or incontinence. Denies joint pain, swelling and limitation in mobility. Denies headaches, seizures, numbness, or tingling. Denies depression, anxiety or insomnia. Denies skin break down or rash.        Objective:   Physical Exam Patient alert and oriented and in no cardiopulmonary distress.  HEENT: No facial asymmetry, EOMI, no sinus tenderness,  oropharynx pink and moist.  Neck supple no adenopathy.  Chest: Clear to auscultation bilaterally.  CVS: S1, S2 no murmurs, no S3.  ABD: Soft non tender. Bowel sounds normal.  Ext: No edema  MS: Adequate ROM spine, shoulders, hips and knees.  Skin: Intact, no ulcerations or rash noted.  Psych: Good eye contact, normal affect. Memory intact not anxious or depressed appearing.  CNS: CN 2-12 intact, power, tone and sensation normal throughout.        Assessment & Plan:

## 2011-07-03 NOTE — Assessment & Plan Note (Signed)
Medication compliance addressed. Commitment to regular exercise and healthy  food choices, with portion control discussed. DASH diet and low fat diet discussed and literature offered. Changes in medication made at this visit.  

## 2011-08-10 ENCOUNTER — Encounter: Payer: Self-pay | Admitting: Family Medicine

## 2011-08-12 LAB — BASIC METABOLIC PANEL
CO2: 31 mEq/L (ref 19–32)
Calcium: 9.9 mg/dL (ref 8.4–10.5)
Creat: 0.72 mg/dL (ref 0.50–1.10)
Glucose, Bld: 83 mg/dL (ref 70–99)

## 2011-08-12 LAB — HEPATIC FUNCTION PANEL
ALT: 13 U/L (ref 0–35)
AST: 18 U/L (ref 0–37)
Alkaline Phosphatase: 56 U/L (ref 39–117)
Bilirubin, Direct: 0.1 mg/dL (ref 0.0–0.3)
Total Bilirubin: 0.5 mg/dL (ref 0.3–1.2)

## 2011-08-12 LAB — LIPID PANEL
Cholesterol: 163 mg/dL (ref 0–200)
HDL: 60 mg/dL (ref >39)

## 2011-08-14 ENCOUNTER — Ambulatory Visit (INDEPENDENT_AMBULATORY_CARE_PROVIDER_SITE_OTHER): Payer: PRIVATE HEALTH INSURANCE | Admitting: Family Medicine

## 2011-08-14 ENCOUNTER — Encounter: Payer: Self-pay | Admitting: Family Medicine

## 2011-08-14 ENCOUNTER — Other Ambulatory Visit: Payer: Self-pay | Admitting: Family Medicine

## 2011-08-14 VITALS — BP 150/80 | HR 52 | Resp 16 | Ht 63.5 in | Wt 121.0 lb

## 2011-08-14 DIAGNOSIS — J45909 Unspecified asthma, uncomplicated: Secondary | ICD-10-CM

## 2011-08-14 DIAGNOSIS — E785 Hyperlipidemia, unspecified: Secondary | ICD-10-CM

## 2011-08-14 DIAGNOSIS — Z09 Encounter for follow-up examination after completed treatment for conditions other than malignant neoplasm: Secondary | ICD-10-CM

## 2011-08-14 DIAGNOSIS — I10 Essential (primary) hypertension: Secondary | ICD-10-CM

## 2011-08-14 DIAGNOSIS — K5909 Other constipation: Secondary | ICD-10-CM

## 2011-08-14 DIAGNOSIS — Z1211 Encounter for screening for malignant neoplasm of colon: Secondary | ICD-10-CM

## 2011-08-14 MED ORDER — AMLODIPINE BESYLATE 10 MG PO TABS: 10.0000 mg | ORAL_TABLET | Freq: Every day | ORAL | Status: DC

## 2011-08-14 NOTE — Patient Instructions (Addendum)
F/U end January.  Dose increase in amlodipine to 10 mg one daily. OK to take TWO 5mg  tablets daily till done  F/u left mammogram to be scheduled for January  Labs are excellent.  Eat greens every day to help bowel movements  You are referred for a colonoscopy , this past due

## 2011-08-14 NOTE — Assessment & Plan Note (Signed)
Uncontrolled. Medication compliance addressed. Commitment to regular exercise, and healthy  eating habits with portion control discussed. DASH diet, and low fat diet discussed, and literature offered. changes in medication at this time.

## 2011-08-15 NOTE — Assessment & Plan Note (Signed)
Stable at this time 

## 2011-08-15 NOTE — Assessment & Plan Note (Signed)
Controlled, no change in medication Hyperlipidemia:Low fat diet discussed and encouraged.  \ 

## 2011-08-15 NOTE — Assessment & Plan Note (Signed)
Pt to continue colace 2 to 4 daily. Reports once weekly enema, probs seem to have worsened with  norvasc, however, reports that fatty foods and greens cause BM, encouraged increased intake of greens

## 2011-08-15 NOTE — Progress Notes (Signed)
  Subjective:    Patient ID: Angela Duncan, female    DOB: Mar 12, 1951, 60 y.o.   MRN: [id number]  HPI The PT is here for follow up and re-evaluation of chronic medical conditions, medication management and review of any available recent lab and radiology data.  Preventive health is updated, specifically  Cancer screening and Immunization.   Questions or concerns regarding consultations or procedures which the PT has had in the interim are  addressed. The PT c/o increased constipation since starting norvasc, however eating increased greens will help this Needs rept mammogram in January for close f/u of left breast. There are no specific complaints       Review of Systems Denies recent fever or chills. Denies sinus pressure, nasal congestion, ear pain or sore throat. Denies chest congestion, productive cough or wheezing. Denies chest pains, palpitations and leg swelling Denies abdominal pain, nausea, vomiting,diarrhea or constipation.   Denies dysuria, frequency, hesitancy or incontinence. Denies joint pain, swelling and limitation in mobility. Denies headaches, seizures, numbness, or tingling. Denies depression, anxiety or insomnia. Denies skin break down or rash.        Objective:   Physical Exam Patient alert and oriented and in no cardiopulmonary distress.  HEENT: No facial asymmetry, EOMI, no sinus tenderness,  oropharynx pink and moist.  Neck supple no adenopathy.  Chest: Clear to auscultation bilaterally.  CVS: S1, S2 no murmurs, no S3.  ABD: Soft non tender. Bowel sounds normal.  Ext: No edema  MS: Adequate ROM spine, shoulders, hips and knees.  Skin: Intact, no ulcerations or rash noted.  Psych: Good eye contact, normal affect. Memory intact not anxious or depressed appearing.  CNS: CN 2-12 intact, power, tone and sensation normal throughout.        Assessment & Plan:

## 2011-08-24 ENCOUNTER — Telehealth: Payer: Self-pay

## 2011-08-24 NOTE — Telephone Encounter (Signed)
Pt referred from Dr. Lodema Hong for screening colonoscopy. LMOM for a return call.

## 2011-08-27 NOTE — Telephone Encounter (Signed)
LMOM to call. Mailing a letter also to call.  

## 2011-09-09 ENCOUNTER — Other Ambulatory Visit: Payer: Self-pay

## 2011-09-09 DIAGNOSIS — I10 Essential (primary) hypertension: Secondary | ICD-10-CM

## 2011-09-09 MED ORDER — LISINOPRIL 40 MG PO TABS: 40.0000 mg | ORAL_TABLET | Freq: Every day | ORAL | Status: DC

## 2011-11-11 ENCOUNTER — Ambulatory Visit (HOSPITAL_COMMUNITY): Payer: PRIVATE HEALTH INSURANCE

## 2011-11-18 ENCOUNTER — Encounter: Payer: Self-pay | Admitting: Family Medicine

## 2011-11-20 ENCOUNTER — Ambulatory Visit: Payer: PRIVATE HEALTH INSURANCE | Admitting: Family Medicine

## 2012-01-26 ENCOUNTER — Telehealth: Payer: Self-pay | Admitting: Family Medicine

## 2012-01-27 ENCOUNTER — Other Ambulatory Visit: Payer: Self-pay

## 2012-01-27 DIAGNOSIS — I10 Essential (primary) hypertension: Secondary | ICD-10-CM

## 2012-01-27 MED ORDER — LISINOPRIL 40 MG PO TABS: 40.0000 mg | ORAL_TABLET | Freq: Every day | ORAL | Status: DC

## 2012-01-27 NOTE — Telephone Encounter (Signed)
Med refilled.

## 2012-02-15 ENCOUNTER — Encounter: Payer: Self-pay | Admitting: Family Medicine

## 2012-02-15 ENCOUNTER — Ambulatory Visit (INDEPENDENT_AMBULATORY_CARE_PROVIDER_SITE_OTHER): Payer: PRIVATE HEALTH INSURANCE | Admitting: Family Medicine

## 2012-02-15 VITALS — BP 110/62 | HR 60 | Resp 16 | Ht 63.5 in | Wt 122.1 lb

## 2012-02-15 DIAGNOSIS — E559 Vitamin D deficiency, unspecified: Secondary | ICD-10-CM

## 2012-02-15 DIAGNOSIS — I10 Essential (primary) hypertension: Secondary | ICD-10-CM

## 2012-02-15 DIAGNOSIS — J45909 Unspecified asthma, uncomplicated: Secondary | ICD-10-CM

## 2012-02-15 DIAGNOSIS — E785 Hyperlipidemia, unspecified: Secondary | ICD-10-CM

## 2012-02-15 MED ORDER — FLUTICASONE-SALMETEROL 250-50 MCG/DOSE IN AEPB: 1.0000 | INHALATION_SPRAY | Freq: Two times a day (BID) | RESPIRATORY_TRACT | Status: DC

## 2012-02-15 NOTE — Patient Instructions (Signed)
F/u in 3.5 month.  Chem 7 and EGFR   Fasting lipid and hepatic in 3.5 month  We will send for advair for you as well as provide you with a coupon.  pls sign a form for [place] to get the last office note from your cardiologist to see when  you next need to see him  We will also send for crestor

## 2012-02-15 NOTE — Progress Notes (Signed)
  Subjective:    Patient ID: Angela Duncan, female    DOB: [birth year], 61 y.o.   MRN: [id number]  HPI  Pt in for f/u recent hospitalization at [facility] for atypical chest pain, final dx , chest wall pain, she denies any current pain and generally feels well. She has signifcant difficulty affording her medicne, crestor and is askign for help  Review of Systems See HPI Denies recent fever or chills. Denies sinus pressure, nasal congestion, ear pain or sore throat. Denies chest congestion, productive cough or wheezing. Denies chest pains, palpitations and leg swelling Denies abdominal pain, nausea, vomiting,diarrhea or constipation.   Denies dysuria, frequency, hesitancy or incontinence. Denies joint pain, swelling and limitation in mobility. Denies headaches, seizures, numbness, or tingling. Denies depression, anxiety or insomnia. Denies skin break down or rash.        Objective:   Physical Exam Patient alert and oriented and in no cardiopulmonary distress.  HEENT: No facial asymmetry, EOMI, no sinus tenderness,  oropharynx pink and moist.  Neck supple no adenopathy.  Chest: Clear to auscultation bilaterally.  CVS: S1, S2 no murmurs, no S3.  ABD: Soft non tender. Bowel sounds normal.  Ext: No edema  MS: Adequate ROM spine, shoulders, hips and knees.  Skin: Intact, no ulcerations or rash noted.  Psych: Good eye contact, normal affect. Memory intact not anxious or depressed appearing.  CNS: CN 2-12 intact, power, tone and sensation normal throughout.        Assessment & Plan:

## 2012-03-07 ENCOUNTER — Other Ambulatory Visit: Payer: Self-pay | Admitting: Family Medicine

## 2012-03-07 DIAGNOSIS — Z09 Encounter for follow-up examination after completed treatment for conditions other than malignant neoplasm: Secondary | ICD-10-CM

## 2012-03-07 DIAGNOSIS — Z139 Encounter for screening, unspecified: Secondary | ICD-10-CM

## 2012-03-09 NOTE — Assessment & Plan Note (Signed)
Hyperlipidemia:Low fat diet discussed and encouraged.  Will obtain updated labs from [place], also asist if possible with medication

## 2012-03-09 NOTE — Assessment & Plan Note (Signed)
Currently controlled, but pt has difficulty obtaining medication due to cost

## 2012-03-09 NOTE — Assessment & Plan Note (Signed)
Controlled, no change in medication  

## 2012-04-01 ENCOUNTER — Telehealth: Payer: Self-pay | Admitting: Family Medicine

## 2012-04-01 NOTE — Telephone Encounter (Signed)
Letter sent to requested fax

## 2012-04-05 ENCOUNTER — Telehealth: Payer: Self-pay

## 2012-04-05 MED ORDER — METOPROLOL TARTRATE 25 MG PO TABS: ORAL_TABLET | ORAL | Status: DC

## 2012-04-05 NOTE — Telephone Encounter (Signed)
Patient aware.

## 2012-04-05 NOTE — Telephone Encounter (Signed)
[facility] called and are requesting refill for Metoprolol 25mg  1 tab bid. Not on medlist and she said it was last prescribed by a different Dr but she wanted them to fax [place]. Do you want to refill this or should it be D/C'd?

## 2012-04-05 NOTE — Telephone Encounter (Signed)
Ok to refill at [facility] dose she had been taking it at x 3 , also let pt know we have crestor  For her to collect please

## 2012-04-13 ENCOUNTER — Ambulatory Visit (HOSPITAL_COMMUNITY): Payer: PRIVATE HEALTH INSURANCE

## 2012-04-13 ENCOUNTER — Ambulatory Visit (HOSPITAL_COMMUNITY)
Admission: RE | Admit: 2012-04-13 | Discharge: 2012-04-13 | Disposition: A | Discharge status: Home / Self Care | Payer: PRIVATE HEALTH INSURANCE | Source: Ambulatory Visit | Attending: Family Medicine | Admitting: Family Medicine

## 2012-04-13 DIAGNOSIS — Z09 Encounter for follow-up examination after completed treatment for conditions other than malignant neoplasm: Secondary | ICD-10-CM | POA: Insufficient documentation

## 2012-04-13 DIAGNOSIS — N63 Unspecified lump in unspecified breast: Secondary | ICD-10-CM | POA: Insufficient documentation

## 2012-04-28 IMAGING — CT CT ABD-PELV W/ CM
2 of 5 series · 16 of 46 positions shown, 18 images · IV contrast (Omnipaque 300)
Comparison: Ultrasound pelvis 03/04/2010

CLINICAL DATA: Pelvic mass.  Hysterectomy.

CT ABDOMEN AND PELVIS WITH CONTRAST
TECHNIQUE: Multidetector CT imaging of the abdomen and pelvis was
performed following the standard protocol during bolus
administration of intravenous contrast.
Contrast: 100 ml Qmnipaque-J00 IV.  Oral contrast media.

[Series 2: abd_pel_with 5.0 b40f · axial · 0.65mm/px · z∈[-414,-38]mm · 13 of 85 slices shown, 15 images]
[im 5/85  soft-tissue]
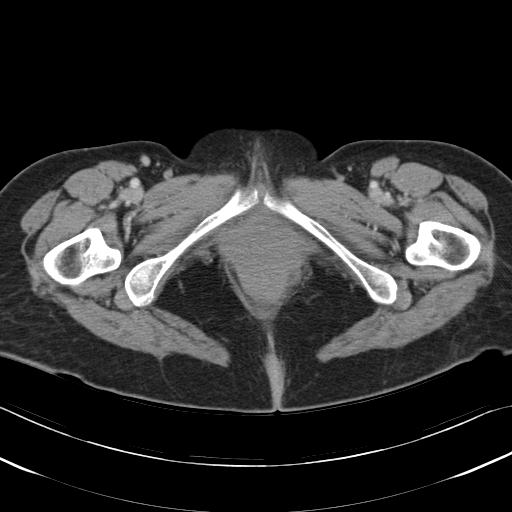
[im 5/85  bone]
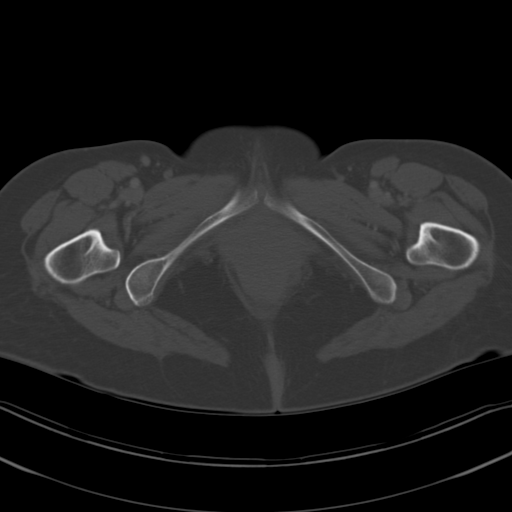
[im 10/85  soft-tissue]
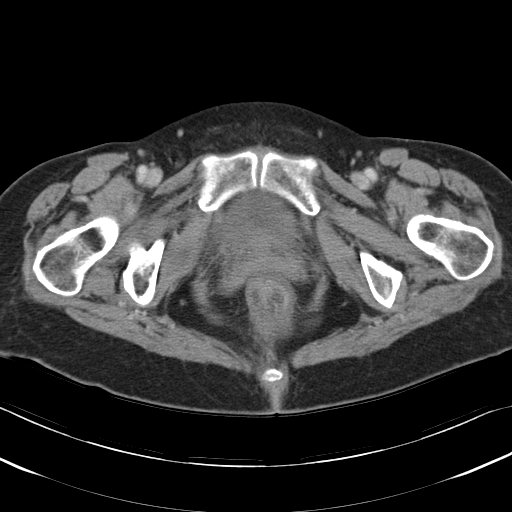
[im 19/85  soft-tissue]
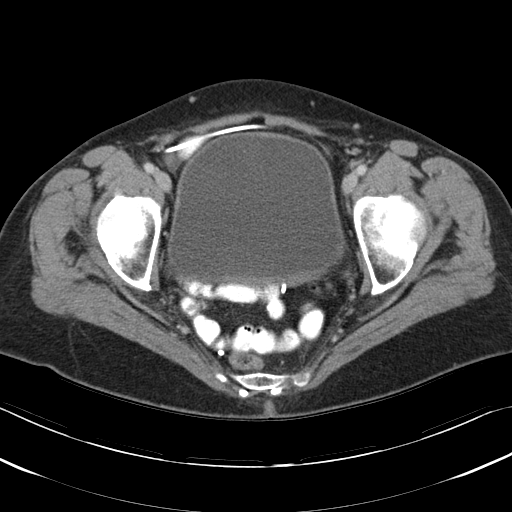
[im 24/85  soft-tissue]
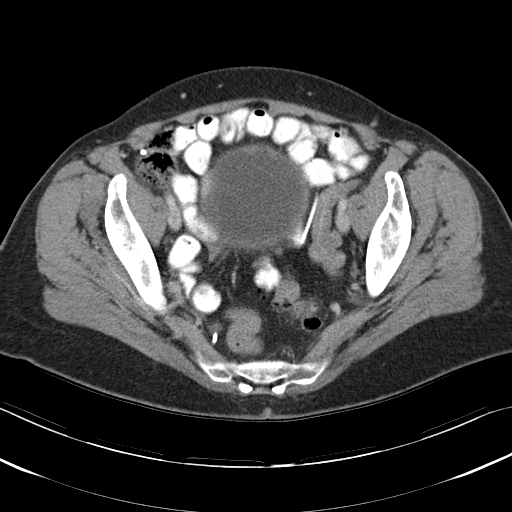
[im 29/85  soft-tissue]
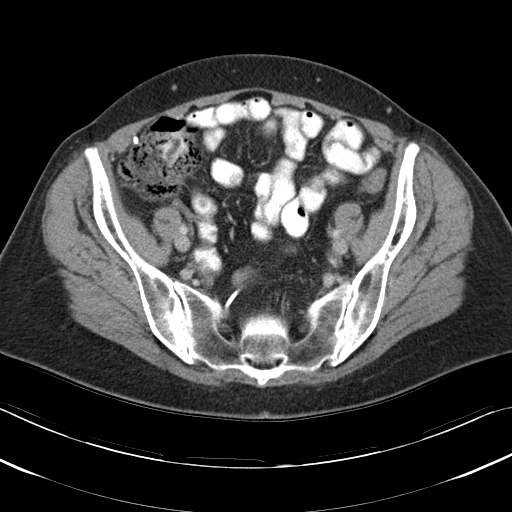
[im 38/85  soft-tissue]
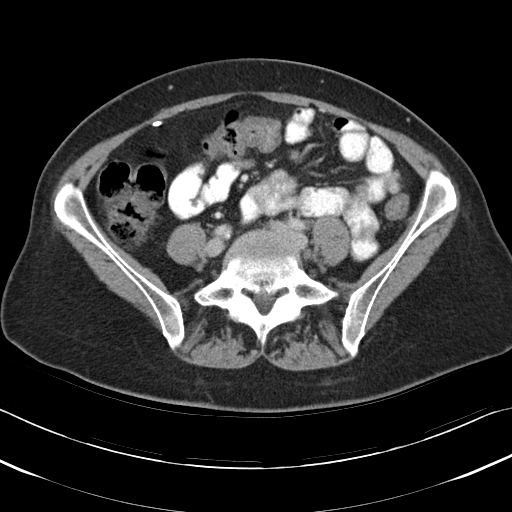
[im 43/85  soft-tissue]
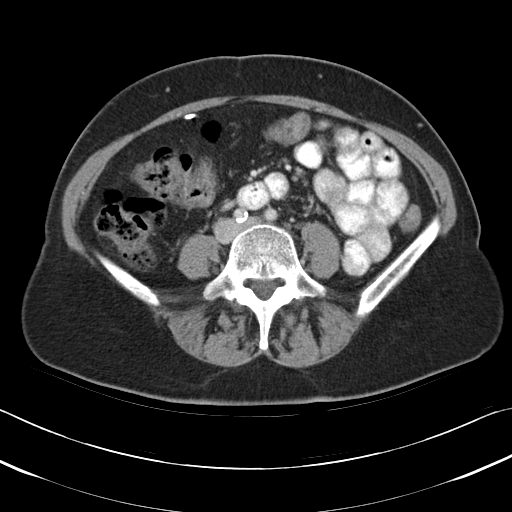
[im 47/85  soft-tissue]
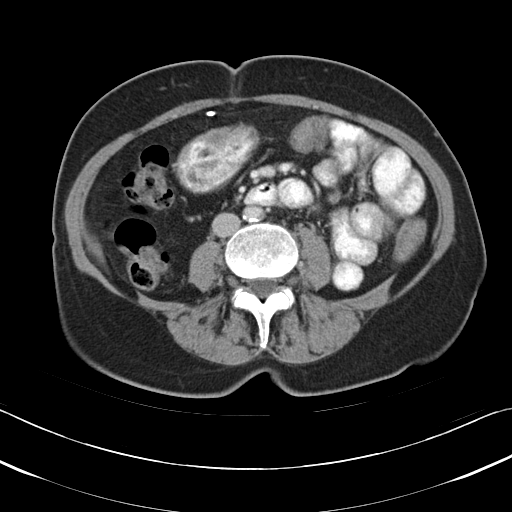
[im 57/85  soft-tissue]
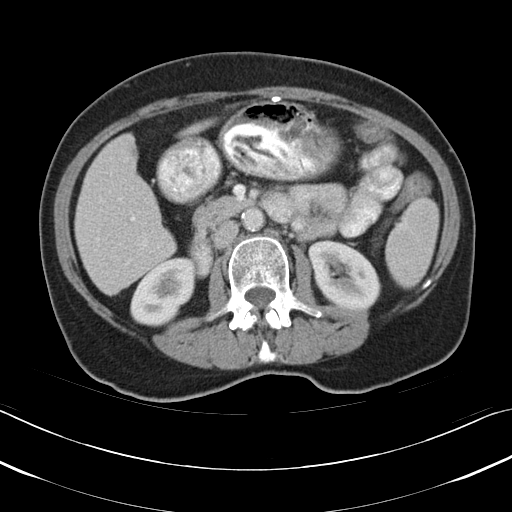
[im 57/85  bone]
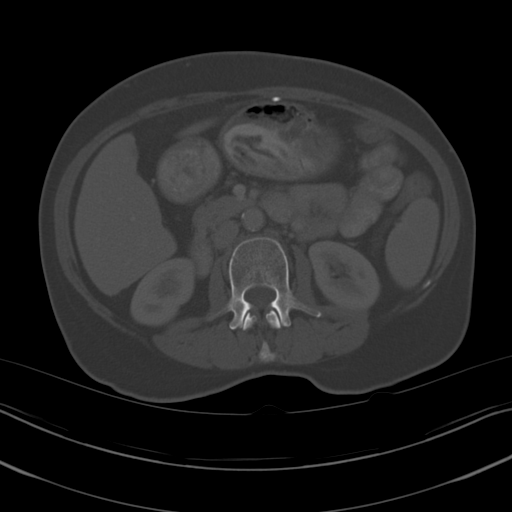
[im 61/85  soft-tissue]
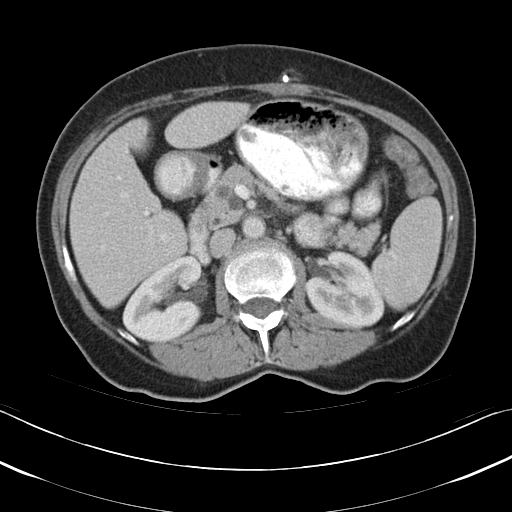
[im 66/85  soft-tissue]
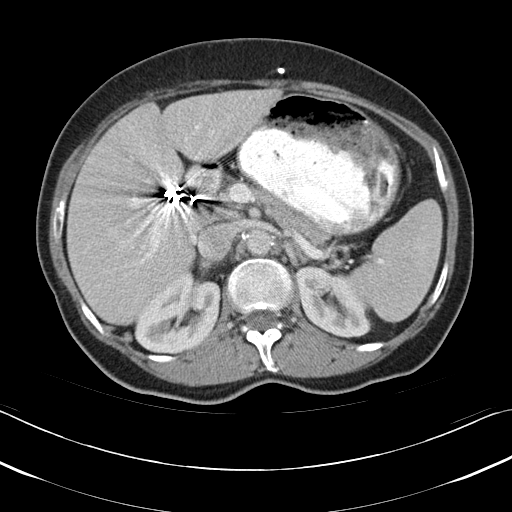
[im 75/85  soft-tissue]
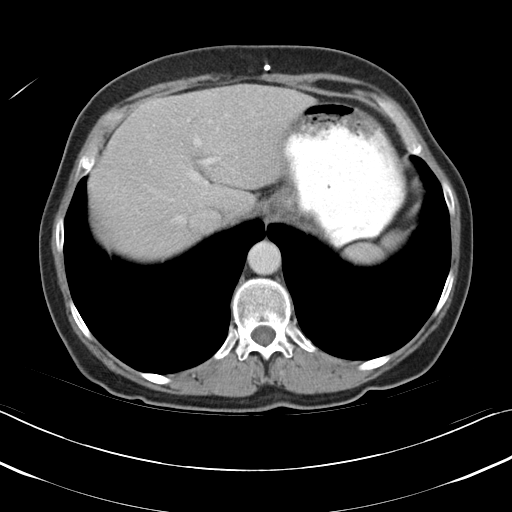
[im 80/85  soft-tissue]
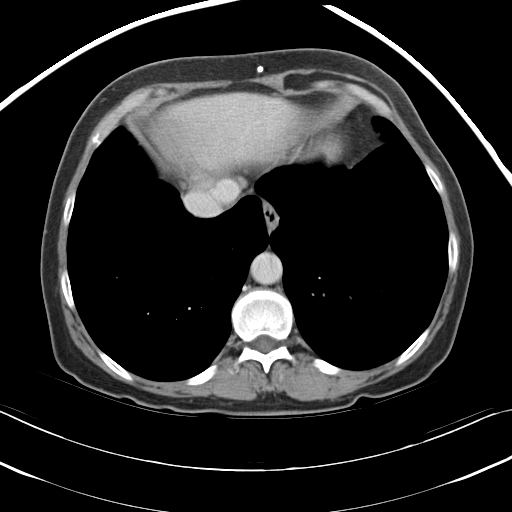

[Series 4: mpr cor post contrast (id) · coronal · 0.60mm/px · 3 of 72 slices shown]
[im 24/72  soft-tissue]
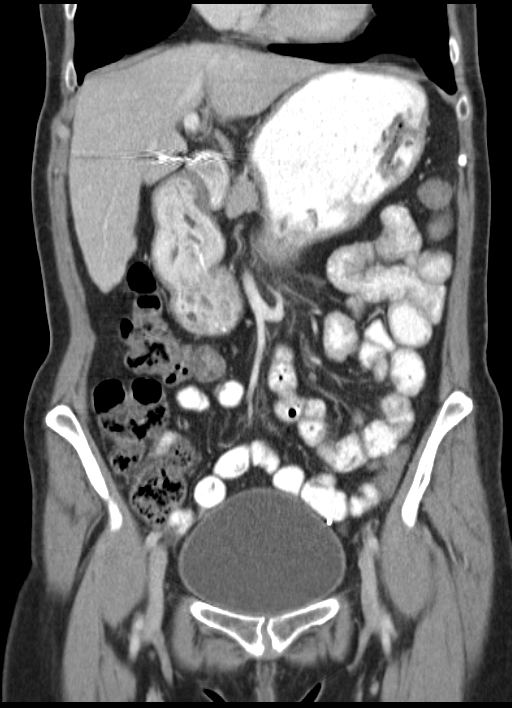
[im 32/72  soft-tissue]
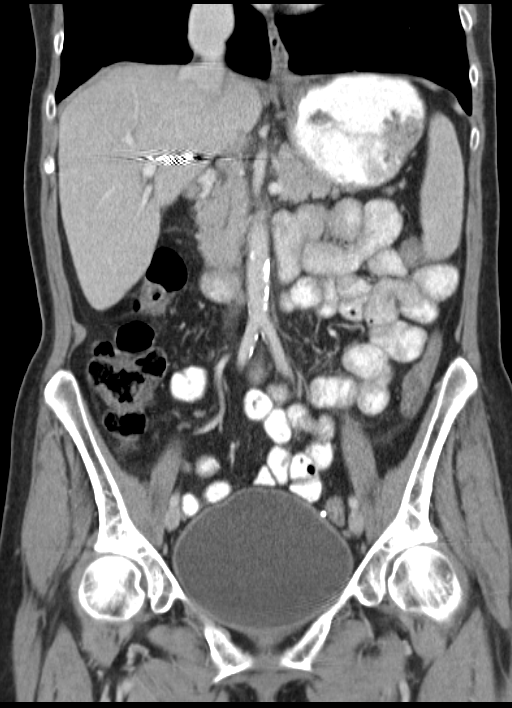
[im 40/72  soft-tissue]
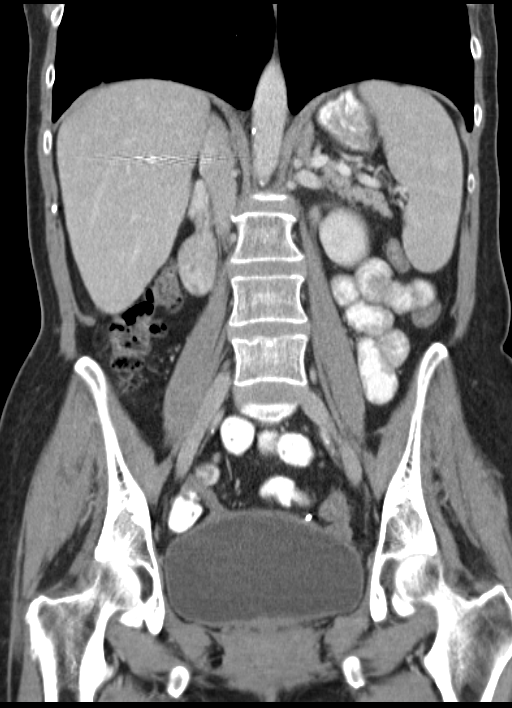

[16 of 46 positions shown; findings below may reference images not displayed]

FINDINGS: Normal liver, spleen, kidneys, and adrenal glands.    Two
small cystic lesions of the pancreatic head (image 25), the largest
measuring 12.5 mm in diameter.  Minimal dilatation of the
pancreatic duct.  Possibly mild atrophy of the pancreatic body.
Findings are likely chronic in nature.  The small cystic lesions
may represent pseudocysts.

 VP shunt is noted in the abdomen and pelvis.  Catheter tip is near
the bladder.  The bladder appears unremarkable with exception of a
bladder diverticulum posterolaterally on the right.  No adnexal
mass.  There are few sigmoid colon diverticula.  Negative for
diverticulitis.  No free fluid.  Bony structures intact.
IMPRESSION: Probable changes of chronic pancreatitis with two small pseudocysts
in the head of the pancreas.

No pelvic mass.  See comments above.

## 2012-05-27 ENCOUNTER — Other Ambulatory Visit: Payer: Self-pay | Admitting: Family Medicine

## 2012-05-28 LAB — LIPID PANEL
Cholesterol: 147 mg/dL (ref 0–200)
HDL: 53 mg/dL
LDL Cholesterol: 78 mg/dL (ref 0–99)
Total CHOL/HDL Ratio: 2.8 ratio
Triglycerides: 79 mg/dL
VLDL: 16 mg/dL (ref 0–40)

## 2012-05-28 LAB — HEPATIC FUNCTION PANEL
ALT: 9 U/L (ref 0–35)
AST: 15 U/L (ref 0–37)
Albumin: 4.3 g/dL (ref 3.5–5.2)
Alkaline Phosphatase: 55 U/L (ref 39–117)
Bilirubin, Direct: 0.1 mg/dL (ref 0.0–0.3)
Indirect Bilirubin: 0.4 mg/dL (ref 0.0–0.9)
Total Bilirubin: 0.5 mg/dL (ref 0.3–1.2)
Total Protein: 6.7 g/dL (ref 6.0–8.3)

## 2012-05-28 LAB — BASIC METABOLIC PANEL WITH GFR
BUN: 23 mg/dL (ref 6–23)
Chloride: 103 mEq/L (ref 96–112)
GFR, Est Non African American: >89 mL/min
Potassium: 4.1 mEq/L (ref 3.5–5.3)

## 2012-05-31 ENCOUNTER — Encounter: Payer: Self-pay | Admitting: Family Medicine

## 2012-05-31 ENCOUNTER — Ambulatory Visit (INDEPENDENT_AMBULATORY_CARE_PROVIDER_SITE_OTHER): Payer: PRIVATE HEALTH INSURANCE | Admitting: Family Medicine

## 2012-05-31 VITALS — BP 156/60 | HR 60 | Resp 18 | Ht 63.5 in | Wt 123.1 lb

## 2012-05-31 DIAGNOSIS — E785 Hyperlipidemia, unspecified: Secondary | ICD-10-CM

## 2012-05-31 DIAGNOSIS — Z9189 Other specified personal risk factors, not elsewhere classified: Secondary | ICD-10-CM

## 2012-05-31 DIAGNOSIS — Z789 Other specified health status: Secondary | ICD-10-CM

## 2012-05-31 DIAGNOSIS — I10 Essential (primary) hypertension: Secondary | ICD-10-CM

## 2012-05-31 DIAGNOSIS — G4700 Insomnia, unspecified: Secondary | ICD-10-CM

## 2012-05-31 DIAGNOSIS — J45909 Unspecified asthma, uncomplicated: Secondary | ICD-10-CM

## 2012-05-31 MED ORDER — ALBUTEROL SULFATE HFA 108 (90 BASE) MCG/ACT IN AERS: 2.0000 | INHALATION_SPRAY | Freq: Four times a day (QID) | RESPIRATORY_TRACT | Status: AC | PRN

## 2012-05-31 MED ORDER — TEMAZEPAM 7.5 MG PO CAPS: 7.5000 mg | ORAL_CAPSULE | Freq: Every evening | ORAL | Status: AC | PRN

## 2012-05-31 MED ORDER — BUDESONIDE-FORMOTEROL FUMARATE 80-4.5 MCG/ACT IN AERO: 2.0000 | INHALATION_SPRAY | Freq: Two times a day (BID) | RESPIRATORY_TRACT | Status: AC

## 2012-05-31 NOTE — Assessment & Plan Note (Signed)
Stable , not using symbicort daily as prescribed, re educated about the need to do so. Will attempt to obtain rescue inhaler, unable to afford, has scripts since 01/2012 when seen in ED

## 2012-05-31 NOTE — Assessment & Plan Note (Signed)
Deteriorated. Sleep hygiene discussed, med prescribed

## 2012-05-31 NOTE — Patient Instructions (Addendum)
cPE  3 month  New medications sent in for asthma , as well as to help with sleep  Please follow good sleep hygiene, we will give you info on this.   You are referred for colon cancer screening

## 2012-05-31 NOTE — Progress Notes (Signed)
  Subjective:    Patient ID: Angela Duncan, female    DOB: 02-Nov-1950, 61 y.o.   MRN: [id number]  HPI The PT is here for follow up and re-evaluation of chronic medical conditions, medication management and review of any available recent lab and radiology data.  Preventive health is updated, specifically  Cancer screening and Immunization.   Questions or concerns regarding consultations or procedures which the PT has had in the interim are  addressed. The PT denies any adverse reactions to current medications since the last visit.  C/o inability to afford medication has no rescue inhaler, no recent flare, we will try to obtain sample for her. C/o increased difficulty falling and staying asleep, has used restoril in the past and is requesting this. Also keeps tV on advised against this    Review of Systems See HPI Denies recent fever or chills. Denies sinus pressure, nasal congestion, ear pain or sore throat. Denies chest congestion, productive cough or wheezing. Denies chest pains, palpitations and leg swelling Denies abdominal pain, nausea, vomiting,diarrhea or constipation.   Denies dysuria, frequency, hesitancy or incontinence. Denies joint pain, swelling and limitation in mobility. Denies headaches, seizures, numbness, or tingling. Mild depression, and also c/o insomnia.Denies suicidal or homicidal ideation and is not hallucinating Denies skin break down or rash.        Objective:   Physical Exam  Patient alert and oriented and in no cardiopulmonary distress.  HEENT: No facial asymmetry, EOMI, no sinus tenderness,  oropharynx pink and moist.  Neck supple no adenopathy.  Chest: Clear to auscultation bilaterally.Decreased though adequate air entry, no wheezing  CVS: S1, S2 no murmurs, no S3.  ABD: Soft non tender. Bowel sounds normal.  Ext: No edema  MS: Adequate ROM spine, shoulders, hips and knees.  Skin: Intact, no ulcerations or rash noted.  Psych: Good eye  contact, normal affect. Memory intact not anxious or depressed appearing.  CNS: CN 2-12 intact, power, tone and sensation normal throughout.       Assessment & Plan:

## 2012-05-31 NOTE — Assessment & Plan Note (Signed)
Controlled, no change in medication Hyperlipidemia:Low fat diet discussed and encouraged.  \ 

## 2012-05-31 NOTE — Assessment & Plan Note (Signed)
Discussed again the importance of colonoscopy, she agrees to have this done

## 2012-05-31 NOTE — Assessment & Plan Note (Signed)
Controlled, no change in medication  

## 2012-06-02 ENCOUNTER — Encounter (INDEPENDENT_AMBULATORY_CARE_PROVIDER_SITE_OTHER): Payer: Self-pay | Admitting: *Deleted

## 2012-08-05 ENCOUNTER — Ambulatory Visit (INDEPENDENT_AMBULATORY_CARE_PROVIDER_SITE_OTHER): Payer: PRIVATE HEALTH INSURANCE | Admitting: Family Medicine

## 2012-08-05 ENCOUNTER — Encounter: Payer: Self-pay | Admitting: Family Medicine

## 2012-08-05 VITALS — BP 146/74 | HR 64 | Resp 18 | Ht 63.5 in | Wt 123.1 lb

## 2012-08-05 DIAGNOSIS — K121 Other forms of stomatitis: Secondary | ICD-10-CM | POA: Insufficient documentation

## 2012-08-05 DIAGNOSIS — K1370 Unspecified lesions of oral mucosa: Secondary | ICD-10-CM

## 2012-08-05 DIAGNOSIS — J45909 Unspecified asthma, uncomplicated: Secondary | ICD-10-CM

## 2012-08-05 MED ORDER — TRIAMCINOLONE ACETONIDE 0.1 % MT PSTE: PASTE | OROMUCOSAL | Status: DC

## 2012-08-05 MED ORDER — LIDOCAINE VISCOUS 2 % MT SOLN: OROMUCOSAL | Status: DC

## 2012-08-05 NOTE — Assessment & Plan Note (Signed)
He obeys ulcer I believe this is due to her dentures rubbing based on the spot where her lipid is located. I've advised her not to wear her dentures which she appears very upset with because of how she will look. Orabase will be used twice a day she can also use a little viscous lidocaine before her meals as this has been very debilitating for her

## 2012-08-05 NOTE — Patient Instructions (Addendum)
Avoid wearing your dentures Use the liquid medicine before meals  Use the cream twice a day until healed Oral Ulcers Oral ulcers are painful, shallow sores around the lining of the mouth. They can affect the gums, the inside of the lips and the cheeks (sores on the outside of the lips and on the face are different). They typically first occur in school aged children and teenagers. Oral ulcers may also be called canker sores or cold sores. CAUSES   Canker sores and cold sores can be caused by many factors including:  Infection.   Injury.   Sun exposure.   Medications.   Emotional stress.   Food allergies.   Vitamin deficiencies.   Toothpastes containing sodium lauryl sulfate.  The Herpes Virus can be the cause of mouth ulcers. The first infection can be severe and cause 10 or more ulcers on the gums, tongue and lips with fever and difficulty in swallowing. This infection usually occurs between the ages of 1 and 3 years.   SYMPTOMS   The typical sore is about  inch (6 mm) in size, is an oval or round ulcer with red borders. DIAGNOSIS   Your caregiver can diagnose simple oral ulcers by examination. Additional testing is usually not required.   TREATMENT   Treatment is aimed at pain relief. Generally, oral ulcers resolve by themselves within 1 to 2 weeks without medication and are not contagious unless caused by Herpes (and other viruses). Antibiotics are not effective with mouth sores. Avoid direct contact with others until the ulcer is completely healed. See your caregiver for follow-up care as recommended. Also:  Offer a soft diet.   Encourage plenty of fluids to prevent dehydration. Popsicles and milk shakes can be helpful.   Avoid acidic and salty foods and drinks such as orange juice.   Infants and young children will often refuse to drink because of pain. Using a teaspoon, cup or syringe to give small amounts of fluids frequently can help prevent dehydration.   Cold  compresses on the face may help reduce pain.   Pain medication can help control soreness.   A solution of diphenhydramine mixed with a liquid antacid can be useful to decrease the soreness of ulcers. Consult a caregiver for the dosing.   Liquids or ointments with a numbing ingredient may be helpful when used as recommended.   Older children and teenagers can rinse their mouth with a salt-water mixture (1/2 teaspoonof salt in 8 ounces of water) four times a day. This treatment is uncomfortable but may reduce the time the ulcers are present.   There are many over the counter throat lozenges and medications available for oral ulcers. There effectiveness has not been studied.   Consult your medical caregiver prior to using homeopathic treatments for oral ulcers.  SEEK MEDICAL CARE IF:    You think your child needs to be seen.   The pain worsens and you cannot control it.   There are 4 or more ulcers.   The lips and gums begin to bleed and crust.   A single mouth ulcer is near a tooth that is causing a toothache or pain.   Your child has a fever, swollen face, or swollen glands.   The ulcers began after starting a medication.   Mouth ulcers keep re-occurring or last more than 2 weeks.   You think your child is not taking adequate fluids.  SEEK IMMEDIATE MEDICAL CARE IF:    Your child has a high  fever.   Your child is unable to swallow or becomes dehydrated.   Your child looks or acts very ill.   An ulcer caused by a chemical your child accidentally put in their mouth.  Document Released: 11/19/2004 Document Revised: 01/04/2012 Document Reviewed: 07/04/2009 [facility] Patient Information 2013 [place], [place].

## 2012-08-05 NOTE — Progress Notes (Signed)
  Subjective:    Patient ID: Angela Duncan, female    DOB: 03/15/1951, 61 y.o.   MRN: [id number]  HPI  Mouth sore for the past week. She wears her dentist however the move around on the bottom. She states she has a swelling underneath her tongue which she's never had before. She has pain when eating and drinking foods. She's not used any over-the-counter medication. She admits to beingwarm in the face but no actual fever, mild sore throat   Review of Systems - Per above     GEN- denies fatigue, fever, weight loss,weakness, recent illness HEENT- denies eye drainage, change in vision, nasal discharge, CVS- denies chest pain, palpitations RESP- denies SOB, cough, wheeze ABD- denies N/V, change in stools, abd pain       Objective:   Physical Exam GEN- NAD, alert and oriented x3 HEENT- PERRL, EOMI, non injected sclera, pink conjunctiva, MMM, oropharynx clear, gray based ulceration on roof of mouth, mild erythema surrounding, no abscess, dentures  Neck- Supple, no LAD CVS- RRR, no murmur RESP-CTAB EXT- No edema Pulses- Radial, DP- 2+        Assessment & Plan:

## 2012-08-05 NOTE — Assessment & Plan Note (Signed)
Currently stable advised her she can use her inhalers with the oral ulcer

## 2012-08-31 ENCOUNTER — Other Ambulatory Visit: Payer: Self-pay

## 2012-08-31 DIAGNOSIS — I10 Essential (primary) hypertension: Secondary | ICD-10-CM

## 2012-08-31 MED ORDER — AMLODIPINE BESYLATE 10 MG PO TABS: 10.0000 mg | ORAL_TABLET | Freq: Every day | ORAL | Status: DC

## 2012-08-31 MED ORDER — LISINOPRIL 40 MG PO TABS: 40.0000 mg | ORAL_TABLET | Freq: Every day | ORAL | Status: DC

## 2012-09-05 ENCOUNTER — Encounter: Payer: Self-pay | Admitting: Family Medicine

## 2012-09-05 ENCOUNTER — Ambulatory Visit (INDEPENDENT_AMBULATORY_CARE_PROVIDER_SITE_OTHER): Payer: PRIVATE HEALTH INSURANCE | Admitting: Family Medicine

## 2012-09-05 ENCOUNTER — Other Ambulatory Visit (HOSPITAL_COMMUNITY)
Admission: RE | Admit: 2012-09-05 | Discharge: 2012-09-05 | Disposition: A | Discharge status: Home / Self Care | Payer: PRIVATE HEALTH INSURANCE | Source: Ambulatory Visit | Attending: Family Medicine | Admitting: Family Medicine

## 2012-09-05 VITALS — BP 138/76 | HR 46 | Resp 18 | Ht 63.5 in | Wt 125.1 lb

## 2012-09-05 DIAGNOSIS — K121 Other forms of stomatitis: Secondary | ICD-10-CM

## 2012-09-05 DIAGNOSIS — B351 Tinea unguium: Secondary | ICD-10-CM

## 2012-09-05 DIAGNOSIS — J45909 Unspecified asthma, uncomplicated: Secondary | ICD-10-CM

## 2012-09-05 DIAGNOSIS — E785 Hyperlipidemia, unspecified: Secondary | ICD-10-CM

## 2012-09-05 DIAGNOSIS — Z0000 Encounter for general adult medical examination without abnormal findings: Secondary | ICD-10-CM

## 2012-09-05 DIAGNOSIS — Z1151 Encounter for screening for human papillomavirus (HPV): Secondary | ICD-10-CM | POA: Insufficient documentation

## 2012-09-05 DIAGNOSIS — Z01419 Encounter for gynecological examination (general) (routine) without abnormal findings: Secondary | ICD-10-CM | POA: Insufficient documentation

## 2012-09-05 DIAGNOSIS — Z1211 Encounter for screening for malignant neoplasm of colon: Secondary | ICD-10-CM

## 2012-09-05 DIAGNOSIS — K1370 Unspecified lesions of oral mucosa: Secondary | ICD-10-CM

## 2012-09-05 LAB — POC HEMOCCULT BLD/STL (OFFICE/1-CARD/DIAGNOSTIC): Fecal Occult Blood, POC: NEGATIVE

## 2012-09-05 MED ORDER — METOPROLOL TARTRATE 25 MG PO TABS: ORAL_TABLET | ORAL | Status: DC

## 2012-09-05 MED ORDER — HYDROCHLOROTHIAZIDE 25 MG PO TABS: 25.0000 mg | ORAL_TABLET | Freq: Every day | ORAL | Status: DC

## 2012-09-05 NOTE — Assessment & Plan Note (Signed)
Severe fungal infection of both great toenails, refer to podiatry for removal

## 2012-09-05 NOTE — Progress Notes (Signed)
  Subjective:    Patient ID: Angela Duncan, female    DOB: 18-Jun-1951, 61 y.o.   MRN: [id number]  HPI The PT is here for annual exam and re-evaluation of chronic medical conditions, medication management and review of any available recent lab and radiology data.  Preventive health is updated, specifically  Cancer screening and Immunization.   The PT denies any adverse reactions to current medications since the last visit. Is significantly impaired as far as obtaining medication is concerned due to financial situation. C/o thickened , fungal toenails and requests referral to podiatry for this    Review of Systems See HPI Denies recent fever or chills. Denies sinus pressure, nasal congestion, ear pain or sore throat.c/o dental pain Denies chest congestion, productive cough or wheezing. Denies chest pains, palpitations and leg swelling Denies abdominal pain, nausea, vomiting,diarrhea or constipation.   Denies dysuria, frequency, hesitancy or incontinence. Denies joint pain, swelling and limitation in mobility. Denies headaches, seizures, numbness, or tingling. Denies depression, anxiety or insomnia.       Objective:   Physical Exam Pleasant well nourished female, alert and oriented x 3, in no cardio-pulmonary distress. Afebrile. HEENT No facial trauma or asymetry. Sinuses non tender.  EOMI, PERTL, fundoscopic exam is normal, no hemorhage or exudate.  External ears normal, tympanic membranes clear. Oropharynx moist, no exudate, poor  dentition. Neck: supple, no adenopathy,JVD or thyromegaly.No bruits.  Chest: Clear to ascultation bilaterally.No crackles or wheezes.Decreased though adequate air entry. Non tender to palpation  Breast: No asymetry,no masses. No nipple discharge or inversion. No axillary or supraclavicular adenopathy  Cardiovascular system; Heart sounds normal,  S1 and  S2 ,no S3.  No murmur, or thrill. Apical beat not displaced Peripheral pulses  normal.  Abdomen: Soft, non tender, no organomegaly or masses. No bruits. Bowel sounds normal. No guarding, tenderness or rebound.  Rectal:  No mass. Guaiac negative stool.  GU: External genitalia normal. No lesions. Vaginal canal normal.physiologic discharge. Uterus absent, no adnexal masses, no cervical motion or adnexal tenderness.  Musculoskeletal exam: Full ROM of spine, hips , shoulders and knees. No deformity ,swelling or crepitus noted. No muscle wasting or atrophy.   Neurologic: Cranial nerves 2 to 12 intact. Power, tone ,sensation and reflexes normal throughout. No disturbance in gait. No tremor.  Skin: Intact, no ulceration, erythema , scaling or rash noted. Pigmentation normal throughout  Psych; Normal mood and affect. Judgement and concentration normal        Assessment & Plan:

## 2012-09-05 NOTE — Patient Instructions (Addendum)
F/u in 4 month, please call if you need me before  Fasting lipid and cmp in 4 months before next visit.  You are referred for colonoscopy, this is an important test to screen for colon cancer, please keep appt  You are referred to podiatry for removal of great toenails due to severe fungal infection.  We will attempt to get med help for you through the health dept , also to get samples of crestor and symbicort in the interim

## 2012-09-06 ENCOUNTER — Encounter (INDEPENDENT_AMBULATORY_CARE_PROVIDER_SITE_OTHER): Payer: Self-pay | Admitting: *Deleted

## 2012-09-14 DIAGNOSIS — Z0000 Encounter for general adult medical examination without abnormal findings: Secondary | ICD-10-CM | POA: Insufficient documentation

## 2012-09-14 NOTE — Assessment & Plan Note (Signed)
Topical anaesthetic prescribed

## 2012-09-14 NOTE — Assessment & Plan Note (Signed)
Annual exam completed and documented. Pt encouraged to follow a low fat diet and keep active. She has severe onychomycosis, and is referred or toenail removal Importance of the colonoscopy is stressed, and she is again referred for this

## 2012-12-28 ENCOUNTER — Other Ambulatory Visit: Payer: Self-pay

## 2012-12-28 DIAGNOSIS — I10 Essential (primary) hypertension: Secondary | ICD-10-CM

## 2012-12-28 MED ORDER — LISINOPRIL 40 MG PO TABS: 40.0000 mg | ORAL_TABLET | Freq: Every day | ORAL | Status: DC

## 2012-12-28 MED ORDER — METOPROLOL TARTRATE 25 MG PO TABS: ORAL_TABLET | ORAL | Status: DC

## 2013-01-03 ENCOUNTER — Ambulatory Visit: Payer: PRIVATE HEALTH INSURANCE | Admitting: Family Medicine

## 2013-01-06 ENCOUNTER — Other Ambulatory Visit: Payer: Self-pay | Admitting: Family Medicine

## 2013-01-07 LAB — COMPREHENSIVE METABOLIC PANEL
ALT: 9 U/L (ref 0–35)
AST: 14 U/L (ref 0–37)
Albumin: 4.5 g/dL (ref 3.5–5.2)
BUN: 26 mg/dL — ABNORMAL HIGH (ref 6–23)
Calcium: 9.8 mg/dL (ref 8.4–10.5)
Chloride: 105 mEq/L (ref 96–112)
Potassium: 4.9 mEq/L (ref 3.5–5.3)
Total Protein: 7.1 g/dL (ref 6.0–8.3)

## 2013-01-10 ENCOUNTER — Encounter: Payer: Self-pay | Admitting: Family Medicine

## 2013-01-10 ENCOUNTER — Ambulatory Visit (HOSPITAL_COMMUNITY)
Admission: RE | Admit: 2013-01-10 | Discharge: 2013-01-10 | Disposition: A | Discharge status: Home / Self Care | Payer: PRIVATE HEALTH INSURANCE | Source: Ambulatory Visit | Attending: Family Medicine | Admitting: Family Medicine

## 2013-01-10 ENCOUNTER — Ambulatory Visit (INDEPENDENT_AMBULATORY_CARE_PROVIDER_SITE_OTHER): Payer: PRIVATE HEALTH INSURANCE | Admitting: Family Medicine

## 2013-01-10 VITALS — BP 120/80 | HR 60 | Resp 18 | Ht 63.5 in | Wt 125.0 lb

## 2013-01-10 DIAGNOSIS — R059 Cough, unspecified: Secondary | ICD-10-CM | POA: Insufficient documentation

## 2013-01-10 DIAGNOSIS — R252 Cramp and spasm: Secondary | ICD-10-CM

## 2013-01-10 DIAGNOSIS — E785 Hyperlipidemia, unspecified: Secondary | ICD-10-CM

## 2013-01-10 DIAGNOSIS — R05 Cough: Secondary | ICD-10-CM | POA: Insufficient documentation

## 2013-01-10 DIAGNOSIS — M62838 Other muscle spasm: Secondary | ICD-10-CM | POA: Insufficient documentation

## 2013-01-10 DIAGNOSIS — J4489 Other specified chronic obstructive pulmonary disease: Secondary | ICD-10-CM | POA: Insufficient documentation

## 2013-01-10 DIAGNOSIS — J3489 Other specified disorders of nose and nasal sinuses: Secondary | ICD-10-CM | POA: Insufficient documentation

## 2013-01-10 MED ORDER — GABAPENTIN 100 MG PO CAPS: 100.0000 mg | ORAL_CAPSULE | Freq: Every day | ORAL | Status: DC

## 2013-01-10 NOTE — Patient Instructions (Addendum)
F/U in  5 month, call if you need me before   You are being referred to ENT due chronic clear nasal drainage, arrange a timewith them when they call  Pls get CXR today   pls continue to work on the colonoscopy, you do need this  Cholesterol and blood pressure are excellent no med changes  New medication for leg cramps

## 2013-01-10 NOTE — Assessment & Plan Note (Signed)
Controlled, no change in medication  

## 2013-01-10 NOTE — Assessment & Plan Note (Signed)
Chronic cough with wheeze intermittently, no recent fever  Or chills. Obtain cXR to further eval lung fields, she does have crackles

## 2013-01-10 NOTE — Assessment & Plan Note (Signed)
Controlled, no change in medication Hyperlipidemia:Low fat diet discussed and encouraged.  \ 

## 2013-01-10 NOTE — Progress Notes (Signed)
  Subjective:    Patient ID: Angela Duncan, female    DOB: [birth year], 62 y.o.   MRN: [id number]  HPI  The PT is here for follow up and re-evaluation of chronic medical conditions, medication management and review of any available recent lab and radiology data.  Preventive health is updated, specifically  Cancer screening and Immunization.States she has no ride for her to get a colonoscopy which she needs, since she does have a positive family h/o colon ca under age 43, her sister   21 month h/o daily clear nasal drainage, bilateral, no fever, chills ,no symptoms  symptoms of uncontrolled allergies H/o cerebral shunt placement chronic daily cough , no sputum [production, denies recent wheezing or dyspnea, uses both inhalers as needed C/o worsening leg cramps which make her cry at night x 3 month    Review of Systems See HPI Denies recent fever or chills. Denies sinus pressure,  ear pain or sore throat. Still has sore area in her mouth from ill fitting dentures, which she removes at night Denies chest pains, palpitations and leg swelling Denies abdominal pain, nausea, vomiting,diarrhea or constipation.   Denies dysuria, frequency, hesitancy or incontinence. Denies joint pain, swelling and limitation in mobility. Denies headaches, seizures, numbness, or tingling. Denies depression, anxiety or insomnia. Denies skin break down or rash.        Objective:   Physical Exam Patient alert and oriented and in no cardiopulmonary distress.  HEENT: No facial asymmetry, EOMI, no sinus tenderness,  oropharynx pink and moist.  Neck supple no adenopathy.  Chest: decreased air entry, few basilar crackles  CVS: S1, S2 no murmurs, no S3.  ABD: Soft non tender. Bowel sounds normal.  Ext: No edema  MS: Adequate ROM spine, shoulders, hips and knees.  Skin: Intact, no ulcerations or rash noted.  Psych: Good eye contact, normal affect. Memory intact not anxious or depressed appearing.  CNS:  CN 2-12 intact, power, tone and sensation normal throughout.        Assessment & Plan:

## 2013-01-10 NOTE — Assessment & Plan Note (Signed)
Deteriorating, trial of gabapentin, she may have restless legs as the underlying pathology

## 2013-01-10 NOTE — Assessment & Plan Note (Signed)
Controlled, no change in medication DASH diet and commitment to daily physical activity for a minimum of 30 minutes discussed and encouraged, as a part of hypertension management. The importance of attaining a healthy weight is also discussed.  

## 2013-01-10 NOTE — Assessment & Plan Note (Signed)
spasms primarily at night and are relieved by walking

## 2013-01-10 NOTE — Assessment & Plan Note (Signed)
3 month h/o daily bilateral nasal drainage, clear, ENT to further eval

## 2013-01-26 ENCOUNTER — Ambulatory Visit (INDEPENDENT_AMBULATORY_CARE_PROVIDER_SITE_OTHER): Payer: PRIVATE HEALTH INSURANCE | Admitting: Otolaryngology

## 2013-03-02 ENCOUNTER — Ambulatory Visit (INDEPENDENT_AMBULATORY_CARE_PROVIDER_SITE_OTHER): Payer: PRIVATE HEALTH INSURANCE | Admitting: Otolaryngology

## 2013-03-02 DIAGNOSIS — J343 Hypertrophy of nasal turbinates: Secondary | ICD-10-CM

## 2013-03-02 DIAGNOSIS — D3701 Neoplasm of uncertain behavior of lip: Secondary | ICD-10-CM

## 2013-03-02 DIAGNOSIS — J310 Chronic rhinitis: Secondary | ICD-10-CM

## 2013-03-10 ENCOUNTER — Other Ambulatory Visit: Payer: Self-pay | Admitting: Family Medicine

## 2013-03-10 DIAGNOSIS — Z139 Encounter for screening, unspecified: Secondary | ICD-10-CM

## 2013-03-30 ENCOUNTER — Ambulatory Visit (INDEPENDENT_AMBULATORY_CARE_PROVIDER_SITE_OTHER): Payer: PRIVATE HEALTH INSURANCE | Admitting: Otolaryngology

## 2013-03-30 DIAGNOSIS — J310 Chronic rhinitis: Secondary | ICD-10-CM

## 2013-04-17 ENCOUNTER — Ambulatory Visit (HOSPITAL_COMMUNITY): Payer: PRIVATE HEALTH INSURANCE

## 2013-04-17 ENCOUNTER — Ambulatory Visit (HOSPITAL_COMMUNITY)
Admission: RE | Admit: 2013-04-17 | Discharge: 2013-04-17 | Disposition: A | Discharge status: Home / Self Care | Payer: PRIVATE HEALTH INSURANCE | Source: Ambulatory Visit | Attending: Family Medicine | Admitting: Family Medicine

## 2013-04-17 DIAGNOSIS — Z139 Encounter for screening, unspecified: Secondary | ICD-10-CM

## 2013-04-17 DIAGNOSIS — Z1231 Encounter for screening mammogram for malignant neoplasm of breast: Secondary | ICD-10-CM | POA: Insufficient documentation

## 2013-05-02 ENCOUNTER — Other Ambulatory Visit: Payer: Self-pay | Admitting: Family Medicine

## 2013-06-13 ENCOUNTER — Ambulatory Visit: Payer: PRIVATE HEALTH INSURANCE | Admitting: Family Medicine

## 2013-06-15 ENCOUNTER — Ambulatory Visit (INDEPENDENT_AMBULATORY_CARE_PROVIDER_SITE_OTHER): Payer: PRIVATE HEALTH INSURANCE | Admitting: Family Medicine

## 2013-06-15 ENCOUNTER — Encounter: Payer: Self-pay | Admitting: Family Medicine

## 2013-06-15 VITALS — BP 130/72 | HR 60 | Resp 16 | Ht 63.5 in | Wt 128.0 lb

## 2013-06-15 DIAGNOSIS — I10 Essential (primary) hypertension: Secondary | ICD-10-CM

## 2013-06-15 DIAGNOSIS — Z659 Problem related to unspecified psychosocial circumstances: Secondary | ICD-10-CM

## 2013-06-15 DIAGNOSIS — R0609 Other forms of dyspnea: Secondary | ICD-10-CM

## 2013-06-15 DIAGNOSIS — J4489 Other specified chronic obstructive pulmonary disease: Secondary | ICD-10-CM

## 2013-06-15 DIAGNOSIS — E785 Hyperlipidemia, unspecified: Secondary | ICD-10-CM

## 2013-06-15 DIAGNOSIS — F3289 Other specified depressive episodes: Secondary | ICD-10-CM

## 2013-06-15 DIAGNOSIS — Z789 Other specified health status: Secondary | ICD-10-CM

## 2013-06-15 DIAGNOSIS — F329 Major depressive disorder, single episode, unspecified: Secondary | ICD-10-CM | POA: Insufficient documentation

## 2013-06-15 DIAGNOSIS — J449 Chronic obstructive pulmonary disease, unspecified: Secondary | ICD-10-CM

## 2013-06-15 DIAGNOSIS — R0989 Other specified symptoms and signs involving the circulatory and respiratory systems: Secondary | ICD-10-CM

## 2013-06-15 DIAGNOSIS — Z9189 Other specified personal risk factors, not elsewhere classified: Secondary | ICD-10-CM

## 2013-06-15 DIAGNOSIS — J45909 Unspecified asthma, uncomplicated: Secondary | ICD-10-CM

## 2013-06-15 DIAGNOSIS — Z658 Other specified problems related to psychosocial circumstances: Secondary | ICD-10-CM

## 2013-06-15 MED ORDER — MIRTAZAPINE 7.5 MG PO TABS: 7.5000 mg | ORAL_TABLET | Freq: Every day | ORAL | Status: DC

## 2013-06-15 NOTE — Patient Instructions (Addendum)
F/u in 6 weeks, call if you need me before  CXR and EKG today   You are referred to Dr Juanetta Gosling about your breathing, and to your heart specialist because you get so tired so quickly   New medication for depression , which will help with sleep and appetite is remeron, take one tablet  at bedtime

## 2013-06-15 NOTE — Progress Notes (Signed)
  Subjective:    Patient ID: Jacklyn Shell, female    DOB: 1951-10-02, 62 y.o.   MRN: [id number]  HPI The PT is here for follow up and re-evaluation of chronic medical conditions, medication management and review of any available recent lab and radiology data.  Preventive health is updated, specifically  Cancer screening and Immunization.  Colonoscopy still outstanding, has no transport . The PT denies any adverse reactions to current medications since the last visit.Continues to have difficulty with med purchases, help provided from the ofdfice C/o increased fatigue and depression, poor appetite and difficulty with sleep. States she has 1 son who is not in touch and states she has no support  C/o increased fatigue with minimal activity, no productive cough or sputum, no chills or fever     Review of Systems See HPI Denies recent fever or chills. Denies sinus pressure, nasal congestion, ear pain or sore throat.  Denies chest pains, palpitations and leg swelling Denies abdominal pain, nausea, vomiting,diarrhea or constipation.   Denies dysuria, frequency, hesitancy or incontinence. Denies joint pain, swelling and limitation in mobility. Denies headaches, seizures, numbness, or tingling. Denies skin break down or rash.        Objective:   Physical Exam  Patient alert and oriented and in no cardiopulmonary distress. Exertional dyspnea, oxygen falls to 88% with ambulation  in office  HEENT: No facial asymmetry, EOMI, no sinus tenderness,  oropharynx pink and moist.  Neck supple no adenopathy.  Chest: Clear to auscultation bilaterally.Decreased air entry throughout  CVS: S1, S2 no murmurs, no S3. EKG: NSR no ischemia ABD: Soft non tender. Bowel sounds normal.  Ext: No edema  MS: Adequate ROM spine, shoulders, hips and knees.  Skin: Intact, no ulcerations or rash noted.  Psych: Good eye contact, flat  affect. Memory intact  depressed appearing.Not actively suicidal or  homicidal , no hallucinations  CNS: CN 2-12 intact, power, tone and sensation normal throughout.       Assessment & Plan:

## 2013-06-15 NOTE — Progress Notes (Signed)
Patient ambulated in office and O2 dropped to 88 and came back up to after resting to 97

## 2013-06-17 DIAGNOSIS — Z659 Problem related to unspecified psychosocial circumstances: Secondary | ICD-10-CM | POA: Insufficient documentation

## 2013-06-17 DIAGNOSIS — Z609 Problem related to social environment, unspecified: Secondary | ICD-10-CM | POA: Insufficient documentation

## 2013-06-17 NOTE — Assessment & Plan Note (Signed)
Controlled, no change in medication DASH diet and commitment to daily physical activity for a minimum of 30 minutes discussed and encouraged, as a part of hypertension management. The importance of attaining a healthy weight is also discussed.  

## 2013-06-17 NOTE — Assessment & Plan Note (Signed)
Worsening dyspnea with minimal activity, refer to pulmonary for help with management

## 2013-06-17 NOTE — Assessment & Plan Note (Addendum)
Hyperlipidemia:Low fat diet discussed and encouraged.  Controlled, no change in medication   

## 2013-06-17 NOTE — Assessment & Plan Note (Signed)
Pt with limited finances soon to retire and unsure she will be "able to make it" Also repeatedly states she has absolutely no support from family for any assistance , even to get a colonoscopy done

## 2013-06-17 NOTE — Assessment & Plan Note (Signed)
Increased , EKG today, and pt to return to her cardiologist for evaluation due to severity of symptoms. EKG in house is normal I believe that her respiratory illness is the bigger culprit

## 2013-06-17 NOTE — Assessment & Plan Note (Signed)
Uncontrolled, pt to start antidepressant

## 2013-06-17 NOTE — Assessment & Plan Note (Signed)
Still unable to get a colonoscopy, states she has no one to transport her to test

## 2013-07-03 ENCOUNTER — Other Ambulatory Visit (HOSPITAL_COMMUNITY): Payer: Self-pay

## 2013-07-03 DIAGNOSIS — J441 Chronic obstructive pulmonary disease with (acute) exacerbation: Secondary | ICD-10-CM

## 2013-07-07 ENCOUNTER — Ambulatory Visit (HOSPITAL_COMMUNITY)
Admission: RE | Admit: 2013-07-07 | Discharge: 2013-07-07 | Disposition: A | Discharge status: Home / Self Care | Payer: PRIVATE HEALTH INSURANCE | Source: Ambulatory Visit | Attending: Pulmonary Disease | Admitting: Pulmonary Disease

## 2013-07-07 DIAGNOSIS — J449 Chronic obstructive pulmonary disease, unspecified: Secondary | ICD-10-CM | POA: Insufficient documentation

## 2013-07-07 DIAGNOSIS — J4489 Other specified chronic obstructive pulmonary disease: Secondary | ICD-10-CM | POA: Insufficient documentation

## 2013-07-07 DIAGNOSIS — R0989 Other specified symptoms and signs involving the circulatory and respiratory systems: Secondary | ICD-10-CM | POA: Insufficient documentation

## 2013-07-07 DIAGNOSIS — R0609 Other forms of dyspnea: Secondary | ICD-10-CM | POA: Insufficient documentation

## 2013-07-07 MED ORDER — ALBUTEROL SULFATE (5 MG/ML) 0.5% IN NEBU
2.5000 mg | INHALATION_SOLUTION | Freq: Once | RESPIRATORY_TRACT | Status: AC
  Administered 2013-07-07: 2.5 mg via RESPIRATORY_TRACT

## 2013-07-11 NOTE — Procedures (Signed)
NAME:  Angela Duncan, Angela Duncan NO.:  [id number]  MEDICAL RECORD NO.:  [id number]  LOCATION:                            FACILITY:  [facility]  PHYSICIAN:  Cliffard Hair L. Juanetta Gosling, M.D.DATE OF BIRTH:  July 14, 1951  DATE OF PROCEDURE: DATE OF DISCHARGE:  07/07/2013                           PULMONARY FUNCTION TEST   REASON FOR PULMONARY FUNCTION TESTING:  COPD.  1. Spirometry shows a severe ventilatory defect with evidence of     airflow obstruction. 2. Lung volumes show air trapping. 3. Diffusion could not be done. 4. Airway resistance is high confirming the presence of airflow     obstruction. 5. There is significant bronchodilator improvement. 6. This study is consistent with clinical diagnosis of chronic     obstructive pulmonary disease.     Rhonda Vangieson L. Juanetta Gosling, M.D.     [facility]/MEDQ  D:  07/10/2013  T:  07/11/2013  Job:  [id number]

## 2013-07-12 LAB — PULMONARY FUNCTION TEST

## 2013-07-28 ENCOUNTER — Encounter: Payer: Self-pay | Admitting: Family Medicine

## 2013-07-28 ENCOUNTER — Ambulatory Visit (INDEPENDENT_AMBULATORY_CARE_PROVIDER_SITE_OTHER): Payer: PRIVATE HEALTH INSURANCE | Admitting: Family Medicine

## 2013-07-28 VITALS — BP 118/62 | HR 60 | Resp 16 | Wt 132.0 lb

## 2013-07-28 DIAGNOSIS — F32A Depression, unspecified: Secondary | ICD-10-CM

## 2013-07-28 DIAGNOSIS — F329 Major depressive disorder, single episode, unspecified: Secondary | ICD-10-CM

## 2013-07-28 DIAGNOSIS — Z609 Problem related to social environment, unspecified: Secondary | ICD-10-CM

## 2013-07-28 DIAGNOSIS — I10 Essential (primary) hypertension: Secondary | ICD-10-CM

## 2013-07-28 DIAGNOSIS — N390 Urinary tract infection, site not specified: Secondary | ICD-10-CM

## 2013-07-28 DIAGNOSIS — G4700 Insomnia, unspecified: Secondary | ICD-10-CM

## 2013-07-28 DIAGNOSIS — F3289 Other specified depressive episodes: Secondary | ICD-10-CM

## 2013-07-28 DIAGNOSIS — Z658 Other specified problems related to psychosocial circumstances: Secondary | ICD-10-CM

## 2013-07-28 DIAGNOSIS — E785 Hyperlipidemia, unspecified: Secondary | ICD-10-CM

## 2013-07-28 DIAGNOSIS — Z23 Encounter for immunization: Secondary | ICD-10-CM

## 2013-07-28 DIAGNOSIS — J4489 Other specified chronic obstructive pulmonary disease: Secondary | ICD-10-CM

## 2013-07-28 DIAGNOSIS — Z659 Problem related to unspecified psychosocial circumstances: Secondary | ICD-10-CM

## 2013-07-28 DIAGNOSIS — J449 Chronic obstructive pulmonary disease, unspecified: Secondary | ICD-10-CM

## 2013-07-28 LAB — LIPID PANEL
Cholesterol: 167 mg/dL (ref 0–200)
HDL: 45 mg/dL (ref >39)
LDL Cholesterol: 90 mg/dL (ref 0–99)
Triglycerides: 161 mg/dL — ABNORMAL HIGH (ref <150)

## 2013-07-28 LAB — COMPREHENSIVE METABOLIC PANEL
Albumin: 4.7 g/dL (ref 3.5–5.2)
Alkaline Phosphatase: 61 U/L (ref 39–117)
BUN: 16 mg/dL (ref 6–23)
CO2: 32 mEq/L (ref 19–32)
Glucose, Bld: 78 mg/dL (ref 70–99)
Potassium: 4.8 mEq/L (ref 3.5–5.3)

## 2013-07-28 LAB — POCT URINALYSIS DIPSTICK
Blood, UA: NEGATIVE
Nitrite, UA: POSITIVE
Urobilinogen, UA: 0.2
pH, UA: 6

## 2013-07-28 LAB — CBC
HCT: 43.8 % (ref 36.0–46.0)
Hemoglobin: 15.3 g/dL — ABNORMAL HIGH (ref 12.0–15.0)
MCH: 35.7 pg — ABNORMAL HIGH (ref 26.0–34.0)
MCHC: 34.9 g/dL (ref 30.0–36.0)
RBC: 4.29 MIL/uL (ref 3.87–5.11)

## 2013-07-28 MED ORDER — CIPROFLOXACIN HCL 500 MG PO TABS: 500.0000 mg | ORAL_TABLET | Freq: Two times a day (BID) | ORAL | Status: AC

## 2013-07-28 MED ORDER — METOPROLOL TARTRATE 25 MG PO TABS: ORAL_TABLET | ORAL | Status: DC

## 2013-07-28 NOTE — Patient Instructions (Addendum)
F/u in 4 month with rectal.  Call if you need me before  You are much better with your new medication for depression, continue to take it   You have a urinary tract infection , 5 day course of cipro is sent in for your infection. Drink a lot of water    CBc, cmp and lipid  Flu vaccine today

## 2013-07-29 NOTE — Progress Notes (Signed)
  Subjective:    Patient ID: Angela Duncan, female    DOB: 03/28/1951, 62 y.o.   MRN: [id number]  HPI  The PT is here for follow up and re-evaluation of chronic medical conditions, medication management and review of any available recent lab and radiology data.  Preventive health is updated, specifically  Cancer screening and Immunization. Still has to wait on transport for colonoscopy  Pulmonary eval complete, no new meds, dx clear as far as COPD is concerned, question asthma The PT denies any adverse reactions to current medications since the last visit.Has done extremely well on remeron, improved sleep, appetite and energy, smiling , wearing nail polish 1 week h/o urinary frequency and dysuria, denies flank pain or fever   Review of Systems See HPI Denies recent fever or chills. Denies sinus pressure, nasal congestion, ear pain or sore throat. Denies chest congestion,or  productive cough chronic cough and shortness of breath with activity Denies chest pains, palpitations and leg swelling Denies abdominal pain, nausea, vomiting,diarrhea or constipation.    Denies joint pain, swelling and limitation in mobility. Denies headaches, seizures, numbness, or tingling. Denies depression, anxiety or insomnia. Denies skin break down or rash.        Objective:   Physical Exam  Patient alert and oriented and in no cardiopulmonary distress.  HEENT: No facial asymmetry, EOMI, no sinus tenderness,  oropharynx pink and moist.  Neck supple no adenopathy.  Chest: markedly decreased air entry throughout,no wheeze, no crackles  CVS: S1, S2 no murmurs, no S3.  ABD: Soft non tender. Bowel sounds normal.No renal angle tenderness  Ext: No edema  MS: Adequate ROM spine, shoulders, hips and knees.  Skin: Intact, no ulcerations or rash noted.  Psych: Good eye contact, normal affect. Memory intact not anxious or depressed appearing.  CNS: CN 2-12 intact, power, tone and sensation normal  throughout.       Assessment & Plan:

## 2013-07-29 NOTE — Assessment & Plan Note (Signed)
Continuee symbicort, unable to afford new med suggested by pulmonary

## 2013-07-29 NOTE — Assessment & Plan Note (Signed)
Still waiting on family member or friend to help to transport her for her colonoscopy

## 2013-07-29 NOTE — Assessment & Plan Note (Signed)
resolved on remeron

## 2013-07-29 NOTE — Assessment & Plan Note (Signed)
Controlled, no change in medication DASH diet and commitment to daily physical activity for a minimum of 30 minutes discussed and encouraged, as a part of hypertension management. The importance of attaining a healthy weight is also discussed.  

## 2013-07-29 NOTE — Assessment & Plan Note (Signed)
1 week h/o symptoms of incontinence and frequency, abn UA, cipro prescribed, c/s to be followed up on

## 2013-07-29 NOTE — Assessment & Plan Note (Signed)
Marked improvement on current med dose, will continue same

## 2013-07-29 NOTE — Assessment & Plan Note (Signed)
slightluy elevated LDL, no med change, continue current and follow low fat diet

## 2013-08-02 LAB — URINE CULTURE

## 2013-08-31 ENCOUNTER — Other Ambulatory Visit: Payer: Self-pay | Admitting: Family Medicine

## 2013-11-17 ENCOUNTER — Encounter: Payer: Self-pay | Admitting: Family Medicine

## 2013-11-17 ENCOUNTER — Encounter (INDEPENDENT_AMBULATORY_CARE_PROVIDER_SITE_OTHER): Payer: Self-pay

## 2013-11-17 ENCOUNTER — Ambulatory Visit (INDEPENDENT_AMBULATORY_CARE_PROVIDER_SITE_OTHER): Payer: PRIVATE HEALTH INSURANCE | Admitting: Family Medicine

## 2013-11-17 VITALS — BP 120/72 | HR 66 | Resp 18 | Ht 63.5 in | Wt 132.1 lb

## 2013-11-17 DIAGNOSIS — F05 Delirium due to known physiological condition: Secondary | ICD-10-CM

## 2013-11-17 DIAGNOSIS — I10 Essential (primary) hypertension: Secondary | ICD-10-CM

## 2013-11-17 DIAGNOSIS — J449 Chronic obstructive pulmonary disease, unspecified: Secondary | ICD-10-CM

## 2013-11-17 NOTE — Progress Notes (Signed)
   Subjective:    Patient ID: Angela Duncan, female    DOB: 12/27/1950, 63 y.o.   MRN: [id number]  HPI Pt ni office today accompanied by her niece, report is that in the past 2 weeks she has had 2 ED visits, initial diagnosis about 10 to 14 days ago was of influenza and dehydration. Two days ago on 11/15/2013, she was taken to a different Ed in [place] where she lives, reportedly found by her family and a friend confused and with reduced level of conciousness. Pt reported then that she had not eaten for approx 2 days because she "did not feel like, she lives alone and family had tried contacting her for 2 days from Monday of this week, and went to check on her on Wednesday 01/21 , she was very confused at the time, and continues to c/o vision disturbance and double vision up to present, she is less confused and intake has improved Record review from [facility] shows normal blood and urine studies, normal CXRbut of note, a head CT scan reports severely  Dilated lateral and third  ventricles, which was not noted on a head scan which was done 2  Months after she had intracranial shunt placement in 2010  Review of Systems See HPI Denies recent fever or chills.Reports poor appetite and confusion in past 1 week, with new onset double vision Denies sinus pressure, nasal congestion, ear pain or sore throat. Denies chest congestion, productive cough or wheezing. Denies chest pains, palpitations and leg swelling Denies abdominal pain, nausea, vomiting,diarrhea or constipation.   Denies dysuria, frequency, hesitancy or incontinence. Denies limitation in mobility. Denies  seizures, numbness, or tingling.       Objective:   Physical Exam  Patient alert and oriented and in no cardiopulmonary distress.  HEENT: No facial asymmetry, EOMI, no sinus tenderness,  oropharynx pink and moist.  Neck supple no adenopathy.  Chest: Adequate air entry throughout ,nno crackles or wheezes  CVS: S1, S2 ,  no S3.  ABD: Soft non tender. Bowel sounds normal.  Ext: No edema  MS: Adequate ROM spine, shoulders, hips and knees.  Skin: Intact, no ulcerations or rash noted.  Psych: Good eye contact, normal affect. Memory intact not anxious or depressed appearing.  CNS: CN 2-12 intact, power,and  tone normal throughout.       Assessment & Plan:  Pt with acute onset of confusion and vision disturbance, no underlying metabolic problem or infectious process or illicit to explain symptoms, urine tox screen in [facility] is negative  SIGNICANT difference is that head CT scan in 2015 reports significantly dilated ventricles and the patient's head scan post shunt placement in 2010 did not report this. Direct contact is made with her neurosurgeon, and she will be evaluated in his office in 3 days, her family is aware of appointment information.  Pt has been told that if she deteriorates during this time, as in developing acute confusion, weakness, numbness, or worsening of her  vision disturbance she needs to immediately return to rhe ED, she understands this.  Gross neurologic exam at visit today is non focal, pt reports double vision but on exam she accurately reported finger count on direct testing

## 2013-11-17 NOTE — Assessment & Plan Note (Signed)
Currently stable, no current or recent h/o respiratory distress

## 2013-11-17 NOTE — Patient Instructions (Addendum)
F/u in  3 week, call if you need me before  If you have episodes of worsening vision, headache, confusion, weakness or loss of sensation, please go DIRECTLY to the nearest emergency room  You need to see a neurologist and an eye specialist due to confusion and blurry vision.  Referrals will be made, please call back on Monday for appointment information   You have been referred for a brain scan, but your INSURANCE information is needed , before we can actually schedule , so please bring in your card next week onday   Spoke directly with neurosurgeon after reviewing brain scan at outside facility, MRI brain is cancelled, pt has evaluation by neurosurgeon on 0126/2015, and she is aware

## 2013-11-17 NOTE — Assessment & Plan Note (Signed)
Acute confusion with report of visual disturbance initially presenting to the Ed on 11/15/2013, when family members found her at home. Of significance head CT scan reports markedly dilated ventricles, which is new since her surgery for hydrocephalus in 2010. Direct contact is made with her neurosurgeon who will see her in 3 days. A brain scan which  Was originally ordered is cancelled (advised against this by the neurosurgeon and faxed in and called over cancellation order)

## 2013-11-17 NOTE — Assessment & Plan Note (Signed)
Controlled though pt reportedly had not been consistently taking her medication in the recent  Past, she is advised against this

## 2013-11-28 ENCOUNTER — Ambulatory Visit: Payer: PRIVATE HEALTH INSURANCE | Admitting: Family Medicine

## 2013-12-07 ENCOUNTER — Other Ambulatory Visit: Payer: Self-pay | Admitting: Neurological Surgery

## 2013-12-07 DIAGNOSIS — G911 Obstructive hydrocephalus: Secondary | ICD-10-CM

## 2013-12-26 ENCOUNTER — Ambulatory Visit
Admission: RE | Admit: 2013-12-26 | Discharge: 2013-12-26 | Disposition: A | Discharge status: Home / Self Care | Payer: PRIVATE HEALTH INSURANCE | Source: Ambulatory Visit | Attending: Neurological Surgery | Admitting: Neurological Surgery

## 2013-12-26 DIAGNOSIS — G911 Obstructive hydrocephalus: Secondary | ICD-10-CM

## 2014-01-01 ENCOUNTER — Other Ambulatory Visit: Payer: Self-pay

## 2014-01-01 MED ORDER — LISINOPRIL 40 MG PO TABS: ORAL_TABLET | ORAL | Status: DC

## 2014-01-01 MED ORDER — HYDROCHLOROTHIAZIDE 25 MG PO TABS: 25.0000 mg | ORAL_TABLET | Freq: Every day | ORAL | Status: DC

## 2014-01-01 MED ORDER — AMLODIPINE BESYLATE 10 MG PO TABS: ORAL_TABLET | ORAL | Status: DC

## 2014-01-01 MED ORDER — METOPROLOL TARTRATE 25 MG PO TABS: ORAL_TABLET | ORAL | Status: DC

## 2014-01-04 ENCOUNTER — Encounter: Payer: Self-pay | Admitting: Family Medicine

## 2014-01-04 ENCOUNTER — Encounter (INDEPENDENT_AMBULATORY_CARE_PROVIDER_SITE_OTHER): Payer: Self-pay

## 2014-01-04 ENCOUNTER — Ambulatory Visit (INDEPENDENT_AMBULATORY_CARE_PROVIDER_SITE_OTHER): Payer: PRIVATE HEALTH INSURANCE | Admitting: Family Medicine

## 2014-01-04 VITALS — BP 106/70 | HR 62 | Resp 18 | Wt 137.1 lb

## 2014-01-04 DIAGNOSIS — E785 Hyperlipidemia, unspecified: Secondary | ICD-10-CM

## 2014-01-04 DIAGNOSIS — J449 Chronic obstructive pulmonary disease, unspecified: Secondary | ICD-10-CM

## 2014-01-04 DIAGNOSIS — F32A Depression, unspecified: Secondary | ICD-10-CM

## 2014-01-04 DIAGNOSIS — I10 Essential (primary) hypertension: Secondary | ICD-10-CM

## 2014-01-04 DIAGNOSIS — Z9189 Other specified personal risk factors, not elsewhere classified: Secondary | ICD-10-CM

## 2014-01-04 DIAGNOSIS — F329 Major depressive disorder, single episode, unspecified: Secondary | ICD-10-CM

## 2014-01-04 DIAGNOSIS — Z789 Other specified health status: Secondary | ICD-10-CM

## 2014-01-04 NOTE — Patient Instructions (Addendum)
F/u in early October, call if you need me before   I am happy that you are much better.  Continue current medications as listed  Fasting lipid and cmp 3 to 5 days  Fall Prevention and Home Safety Falls cause injuries and can affect all age groups. It is possible to prevent falls.  HOW TO PREVENT FALLS  Wear shoes with rubber soles that do not have an opening for your toes.  Keep the inside and outside of your house well lit.  Use night lights throughout your home.  Remove clutter from floors.  Clean up floor spills.  Remove throw rugs or fasten them to the floor with carpet tape.  Do not place electrical cords across pathways.  Put grab bars by your tub, shower, and toilet. Do not use towel bars as grab bars.  Put handrails on both sides of the stairway. Fix loose handrails.  Do not climb on stools or stepladders, if possible.  Do not wax your floors.  Repair uneven or unsafe sidewalks, walkways, or stairs.  Keep items you use a lot within reach.  Be aware of pets.  Keep emergency numbers next to the telephone.  Put smoke detectors in your home and near bedrooms. Ask your doctor what other things you can do to prevent falls. Document Released: 08/08/2009 Document Revised: 04/12/2012 Document Reviewed: 01/12/2012 [facility] Patient Information 2014 [place], [place].

## 2014-01-07 NOTE — Assessment & Plan Note (Signed)
Updated lab needed at/ before next visit. Hyperlipidemia:Low fat diet discussed and encouraged.   

## 2014-01-07 NOTE — Assessment & Plan Note (Signed)
Controlled, no change in medication Pt now out of work due to poor lung function

## 2014-01-07 NOTE — Assessment & Plan Note (Signed)
Still needs colonoscopy, and not ready to schedule , reports no transport to be the problem, will work on this, she needs this

## 2014-01-07 NOTE — Assessment & Plan Note (Signed)
Controlled, no change in medication DASH diet and commitment to daily physical activity for a minimum of 30 minutes discussed and encouraged, as a part of hypertension management. The importance of attaining a healthy weight is also discussed.  

## 2014-01-07 NOTE — Progress Notes (Signed)
   Subjective:    Patient ID: Angela Duncan, female    DOB: 11/17/1950, 63 y.o.   MRN: [id number]  HPI The PT is here for follow up and re-evaluation of chronic medical conditions, medication management and review of any available recent lab and radiology data.  Preventive health is updated, specifically  Cancer screening and Immunization.   Had adjustments made by neurosurgery, and has regained normal function. Pt had recently been put in an MRI, she has a cerebral shunt , and this made her very confused The PT denies any adverse reactions to current medications since the last visit.  There are no new concerns. Now retired, no longer depressed , not stressed, siill  Needs her colonoscopy There are no specific complaints       Review of Systems See HPI Denies recent fever or chills. Denies sinus pressure, nasal congestion, ear pain or sore throat. Denies chest congestion, productive cough or wheezing. Denies chest pains, palpitations and leg swelling Denies abdominal pain, nausea, vomiting,diarrhea or constipation.   Denies dysuria, frequency, hesitancy or incontinence. Denies joint pain, swelling and limitation in mobility. Denies headaches, seizures, numbness, or tingling. Denies depression, anxiety or insomnia. Denies skin break down or rash.        Objective:   Physical Exam BP 106/70  Pulse 62  Resp 18  Wt 137 lb 1.9 oz (62.197 kg)  SpO2 99% Patient alert and oriented and in no cardiopulmonary distress.  HEENT: No facial asymmetry, EOMI, no sinus tenderness,  oropharynx pink and moist.  Neck supple no adenopathy.  Chest: Clear to auscultation bilaterally.Decreased air entry throughout  CVS: S1, S2 no murmurs, no S3.  ABD: Soft non tender. Bowel sounds normal.  Ext: No edema  MS: Adequate ROM spine, shoulders, hips and knees.  Skin: Intact, no ulcerations or rash noted.  Psych: Good eye contact, normal affect. Memory intact not anxious or depressed  appearing.  CNS: CN 2-12 intact, power, tone and sensation normal throughout.        Assessment & Plan:  HYPERTENSION Controlled, no change in medication DASH diet and commitment to daily physical activity for a minimum of 30 minutes discussed and encouraged, as a part of hypertension management. The importance of attaining a healthy weight is also discussed.   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 Hyperlipidemia:Low fat diet discussed and encouraged.

## 2014-01-07 NOTE — Assessment & Plan Note (Signed)
Resolved, and on no medication 

## 2014-03-16 ENCOUNTER — Other Ambulatory Visit: Payer: Self-pay | Admitting: Family Medicine

## 2014-03-16 DIAGNOSIS — Z1231 Encounter for screening mammogram for malignant neoplasm of breast: Secondary | ICD-10-CM

## 2014-04-19 ENCOUNTER — Ambulatory Visit (HOSPITAL_COMMUNITY)
Admission: RE | Admit: 2014-04-19 | Discharge: 2014-04-19 | Disposition: A | Discharge status: Home / Self Care | Payer: No Typology Code available for payment source | Source: Ambulatory Visit | Attending: Family Medicine | Admitting: Family Medicine

## 2014-04-19 DIAGNOSIS — Z1231 Encounter for screening mammogram for malignant neoplasm of breast: Secondary | ICD-10-CM | POA: Insufficient documentation

## 2014-05-04 ENCOUNTER — Other Ambulatory Visit: Payer: Self-pay | Admitting: Family Medicine

## 2014-07-06 ENCOUNTER — Other Ambulatory Visit: Payer: Self-pay | Admitting: Family Medicine

## 2014-07-23 ENCOUNTER — Other Ambulatory Visit: Payer: Self-pay | Admitting: Family Medicine

## 2014-07-23 LAB — LIPID PANEL
CHOL/HDL RATIO: 7.7 ratio
Cholesterol: 291 mg/dL — ABNORMAL HIGH (ref 0–200)
HDL: 38 mg/dL — AB (ref >39)
LDL Cholesterol: 185 mg/dL — ABNORMAL HIGH (ref 0–99)
TRIGLYCERIDES: 339 mg/dL — AB (ref <150)
VLDL: 68 mg/dL — AB (ref 0–40)

## 2014-07-23 LAB — COMPREHENSIVE METABOLIC PANEL
ALBUMIN: 4.9 g/dL (ref 3.5–5.2)
ALT: 8 U/L (ref 0–35)
AST: 14 U/L (ref 0–37)
Alkaline Phosphatase: 75 U/L (ref 39–117)
BUN: 22 mg/dL (ref 6–23)
CALCIUM: 10.4 mg/dL (ref 8.4–10.5)
CO2: 31 mEq/L (ref 19–32)
Chloride: 102 mEq/L (ref 96–112)
Creat: 1.04 mg/dL (ref 0.50–1.10)
Glucose, Bld: 94 mg/dL (ref 70–99)
POTASSIUM: 4.2 meq/L (ref 3.5–5.3)
Sodium: 142 mEq/L (ref 135–145)
TOTAL PROTEIN: 7.9 g/dL (ref 6.0–8.3)
Total Bilirubin: 0.7 mg/dL (ref 0.2–1.2)

## 2014-07-25 ENCOUNTER — Other Ambulatory Visit: Payer: Self-pay

## 2014-07-25 MED ORDER — LOVASTATIN 40 MG PO TABS: 40.0000 mg | ORAL_TABLET | Freq: Every day | ORAL | Status: DC

## 2014-08-08 ENCOUNTER — Encounter: Payer: Self-pay | Admitting: Family Medicine

## 2014-08-08 ENCOUNTER — Ambulatory Visit (INDEPENDENT_AMBULATORY_CARE_PROVIDER_SITE_OTHER): Payer: PRIVATE HEALTH INSURANCE | Admitting: Family Medicine

## 2014-08-08 VITALS — BP 140/72 | HR 62 | Resp 16 | Ht 64.0 in | Wt 134.0 lb

## 2014-08-08 DIAGNOSIS — E785 Hyperlipidemia, unspecified: Secondary | ICD-10-CM

## 2014-08-08 DIAGNOSIS — Z1211 Encounter for screening for malignant neoplasm of colon: Secondary | ICD-10-CM

## 2014-08-08 DIAGNOSIS — Z23 Encounter for immunization: Secondary | ICD-10-CM

## 2014-08-08 DIAGNOSIS — G4700 Insomnia, unspecified: Secondary | ICD-10-CM

## 2014-08-08 DIAGNOSIS — Z9189 Other specified personal risk factors, not elsewhere classified: Secondary | ICD-10-CM

## 2014-08-08 DIAGNOSIS — I10 Essential (primary) hypertension: Secondary | ICD-10-CM

## 2014-08-08 DIAGNOSIS — J449 Chronic obstructive pulmonary disease, unspecified: Secondary | ICD-10-CM

## 2014-08-08 LAB — POC HEMOCCULT BLD/STL (OFFICE/1-CARD/DIAGNOSTIC): FECAL OCCULT BLD: NEGATIVE

## 2014-08-08 NOTE — Patient Instructions (Signed)
F/u in 4.5 month, call if you need me before  Flu vaccine today   Rectal exam today  Fasting lipid, cmp, cbc and TSH in 4 month, approx 1 week before next visit please  Handicap sticker today   Please discuss colonoscopy some more

## 2014-08-09 DIAGNOSIS — Z23 Encounter for immunization: Secondary | ICD-10-CM | POA: Insufficient documentation

## 2014-08-09 NOTE — Assessment & Plan Note (Signed)
Vaccine administered at visit.  

## 2014-08-09 NOTE — Assessment & Plan Note (Signed)
severe , dyspneic with minimal activity, handicap sticker application provided  re educated re need to commit to regular inhaler use as prescribed, unfortunately states unable to afford neb machine

## 2014-08-09 NOTE — Progress Notes (Signed)
   Subjective:    Patient ID: Angela Duncan, female    DOB: [birth year], 63 y.o.   MRN: [id number]  HPI The PT is here for follow up and re-evaluation of chronic medical conditions, medication management and review of any available recent lab and radiology data.  Preventive health is updated, specifically  Cancer screening and Immunization.   Questions or concerns regarding consultations or procedures which the PT has had in the interim are  addressed. The PT denies any adverse reactions to current medications since the last visit.  There are no new concerns.  There are no specific complaints       Review of Systems See HPI Denies recent fever or chills. Denies sinus pressure, nasal congestion, ear pain or sore throat. Denies chest congestion or  productive cough has chronic severe shortness of breath with minimal activity which is worsening, also  wheezing. Denies chest pains, palpitations and leg swelling Denies abdominal pain, nausea, vomiting,diarrhea or constipation.   Denies dysuria, frequency, hesitancy or incontinence. Denies joint pain, swelling and limitation in mobility. Denies headaches, seizures, numbness, or tingling. Denies depression, anxiety or insomnia. Denies skin break down or rash.        Objective:   Physical Exam BP 140/72  Pulse 62  Resp 16  Ht 5\' 4"  (1.626 m)  Wt 134 lb (60.782 kg)  BMI 22.99 kg/m2  SpO2 96% Patient alert and oriented and in no cardiopulmonary distress.  HEENT: No facial asymmetry, EOMI,   oropharynx pink and moist.  Neck supple no JVD, no mass.  Chest: Markedly reduced air entry throughout , no crackles , scattered high pitched wheezes  CVS: S1, S2 no murmurs, no S3.Regular rate.  ABD: Soft non tender. No organomegaly or mass. Normal BS Rectal: good sphincter tone, no mass, heme negative stool  Ext: No edema  MS: Adequate ROM spine, shoulders, hips and knees.  Skin: Intact, no ulcerations or rash noted.  Psych:  Good eye contact, normal affect. Memory intact not anxious or depressed appearing.  CNS: CN 2-12 intact, power,  normal throughout.no focal deficits noted.        Assessment & Plan:  Essential hypertension Controlled, no change in medication DASH diet and commitment to daily physical activity for a minimum of 30 minutes discussed and encouraged, as a part of hypertension management. The importance of attaining a healthy weight is also discussed.   COPD with asthma severe , dyspneic with minimal activity, handicap sticker application provided  re educated re need to commit to regular inhaler use as prescribed, unfortunately states unable to afford neb machine  Colon cancer high risk Rectal exam good sphincter tone, no mass, heme neg stool Importance of arranging for and getting colonoscopy, has never had one and has positive f/h colon cancer is stressed  Hyperlipidemia LDL goal <100 Uncontrolled, had not been able to afford medication, now filling affordable med Hyperlipidemia:Low fat diet discussed and encouraged.  Updated lab needed at/ before next visit.   Insomnia Controlled, no change in medication Sleep hygiene reviewed and written information offered also. Prescription sent for  medication needed.   Need for prophylactic vaccination and inoculation against influenza Vaccine administered at visit.

## 2014-08-09 NOTE — Assessment & Plan Note (Signed)
Rectal exam good sphincter tone, no mass, heme neg stool Importance of arranging for and getting colonoscopy, has never had one and has positive f/h colon cancer is stressed

## 2014-08-09 NOTE — Assessment & Plan Note (Signed)
Uncontrolled, had not been able to afford medication, now filling affordable med Hyperlipidemia:Low fat diet discussed and encouraged.  Updated lab needed at/ before next visit.

## 2014-08-09 NOTE — Assessment & Plan Note (Signed)
Controlled, no change in medication DASH diet and commitment to daily physical activity for a minimum of 30 minutes discussed and encouraged, as a part of hypertension management. The importance of attaining a healthy weight is also discussed.  

## 2014-08-09 NOTE — Assessment & Plan Note (Signed)
Controlled, no change in medication Sleep hygiene reviewed and written information offered also. Prescription sent for  medication needed.  

## 2014-08-24 ENCOUNTER — Other Ambulatory Visit: Payer: Self-pay | Admitting: Family Medicine

## 2014-11-12 ENCOUNTER — Other Ambulatory Visit: Payer: Self-pay | Admitting: Family Medicine

## 2014-11-30 LAB — CBC
HCT: 44.6 % (ref 36.0–46.0)
Hemoglobin: 15.7 g/dL — ABNORMAL HIGH (ref 12.0–15.0)
MCH: 35.4 pg — ABNORMAL HIGH (ref 26.0–34.0)
MCHC: 35.2 g/dL (ref 30.0–36.0)
MCV: 100.5 fL — ABNORMAL HIGH (ref 78.0–100.0)
PLATELETS: 326 10*3/uL (ref 150–400)
RBC: 4.44 MIL/uL (ref 3.87–5.11)
RDW: 12.7 % (ref 11.5–15.5)
WBC: 4.6 10*3/uL (ref 4.0–10.5)

## 2014-11-30 LAB — LIPID PANEL
Cholesterol: 211 mg/dL — ABNORMAL HIGH (ref 0–200)
HDL: 43 mg/dL (ref >39)
LDL CALC: 119 mg/dL — AB (ref 0–99)
TRIGLYCERIDES: 246 mg/dL — AB (ref <150)
Total CHOL/HDL Ratio: 4.9 Ratio
VLDL: 49 mg/dL — ABNORMAL HIGH (ref 0–40)

## 2014-11-30 LAB — COMPREHENSIVE METABOLIC PANEL
ALT: 8 U/L (ref 0–35)
AST: 16 U/L (ref 0–37)
Albumin: 4.7 g/dL (ref 3.5–5.2)
Alkaline Phosphatase: 88 U/L (ref 39–117)
BUN: 15 mg/dL (ref 6–23)
CALCIUM: 9.9 mg/dL (ref 8.4–10.5)
CO2: 33 mEq/L — ABNORMAL HIGH (ref 19–32)
Chloride: 99 mEq/L (ref 96–112)
Creat: 1.05 mg/dL (ref 0.50–1.10)
GLUCOSE: 94 mg/dL (ref 70–99)
Potassium: 4.2 mEq/L (ref 3.5–5.3)
Sodium: 141 mEq/L (ref 135–145)
Total Bilirubin: 0.7 mg/dL (ref 0.2–1.2)
Total Protein: 7.8 g/dL (ref 6.0–8.3)

## 2014-12-01 LAB — TSH: TSH: 2.605 u[IU]/mL (ref 0.350–4.500)

## 2014-12-13 ENCOUNTER — Encounter: Payer: Self-pay | Admitting: Family Medicine

## 2014-12-13 ENCOUNTER — Ambulatory Visit (INDEPENDENT_AMBULATORY_CARE_PROVIDER_SITE_OTHER): Payer: BLUE CROSS/BLUE SHIELD | Admitting: Family Medicine

## 2014-12-13 VITALS — BP 128/82 | HR 64 | Resp 16 | Ht 64.0 in | Wt 136.0 lb

## 2014-12-13 DIAGNOSIS — E785 Hyperlipidemia, unspecified: Secondary | ICD-10-CM

## 2014-12-13 DIAGNOSIS — J449 Chronic obstructive pulmonary disease, unspecified: Secondary | ICD-10-CM

## 2014-12-13 DIAGNOSIS — F329 Major depressive disorder, single episode, unspecified: Secondary | ICD-10-CM

## 2014-12-13 DIAGNOSIS — I10 Essential (primary) hypertension: Secondary | ICD-10-CM

## 2014-12-13 DIAGNOSIS — F32A Depression, unspecified: Secondary | ICD-10-CM

## 2014-12-13 DIAGNOSIS — Z23 Encounter for immunization: Secondary | ICD-10-CM

## 2014-12-13 NOTE — Patient Instructions (Signed)
F/u in 5 month, call if you need me before   You are referred to dr Luan Pulling for evaluation of  Breathing  And help with this  Prevnar today  Urine is being checked today / tomorrow if you need to bring it back  Cholesterol is  better and blood pressure is good

## 2014-12-13 NOTE — Progress Notes (Signed)
   Subjective:    Patient ID: Angela Duncan, female    DOB: [birth year], 64 y.o.   MRN: [id number]  HPI The PT is here for follow up and re-evaluation of chronic medical conditions, medication management and review of any available recent lab and radiology data.  Preventive health is updated, specifically  Cancer screening and Immunization.   Questions or concerns regarding consultations or procedures which the PT has had in the interim are  addressed. The PT denies any adverse reactions to current medications since the last visit.  C/o worsening shortness of breath even at rest  C/o worseneing breathing unable to afford oxygen C/o nocturia for month   Review of Systems    See HPI Denies recent fever or chills. Denies sinus pressure, nasal congestion, ear pain or sore throat. Denies chest congestion, productive cough or wheezing. Denies chest pains, palpitations and leg swelling Denies abdominal pain, nausea, vomiting,diarrhea or constipation.   Denies dysuria, frequency, hesitancy or incontinence. Denies joint pain, swelling and limitation in mobility. Denies headaches, seizures, numbness, or tingling. Denies depression, anxiety or insomnia. Denies skin break down or rash.     Objective:   Physical Exam BP 128/82 mmHg  Pulse 64  Resp 16  Ht 5\' 4"  (1.626 m)  Wt 136 lb (61.689 kg)  BMI 23.33 kg/m2  SpO2 97% Patient alert and oriented and in no cardiopulmonary distress.  HEENT: No facial asymmetry, EOMI,   oropharynx pink and moist.  Neck supple no JVD, no mass.  Chest: Clear to auscultation bilaterally.decreased air entry throiughout  CVS: S1, S2 no murmurs, no S3.Regular rate.  ABD: Soft non tender.   Ext: No edema  MS: Adequate ROM spine, shoulders, hips and knees.  Skin: Intact, no ulcerations or rash noted.  Psych: Good eye contact, normal affect. Memory intact not anxious or depressed appearing.  CNS: CN 2-12 intact, power,  normal throughout.no focal  deficits noted.        Assessment & Plan:  COPD with asthma Deteriorating , unable to afford oxygen though she needs this Chronic meds remain an ongoing challenge Refer to pulmonary foreval and management   Depression Controlled, no change in medication    Essential hypertension Controlled, no change in medication DASH diet and commitment to daily physical activity for a minimum of 30 minutes discussed and encouraged, as a part of hypertension management. The importance of attaining a healthy weight is also discussed.    Hyperlipidemia LDL goal <100 Improved , though still not at goal Hyperlipidemia:Low fat diet discussed and encouraged.     Need for vaccination with 13-polyvalent pneumococcal conjugate vaccine Vaccine administered at visit.

## 2014-12-16 DIAGNOSIS — Z23 Encounter for immunization: Secondary | ICD-10-CM | POA: Insufficient documentation

## 2014-12-16 NOTE — Assessment & Plan Note (Signed)
Deteriorating , unable to afford oxygen though she needs this Chronic meds remain an ongoing challenge Refer to pulmonary foreval and management

## 2014-12-16 NOTE — Assessment & Plan Note (Signed)
Controlled, no change in medication DASH diet and commitment to daily physical activity for a minimum of 30 minutes discussed and encouraged, as a part of hypertension management. The importance of attaining a healthy weight is also discussed.  

## 2014-12-16 NOTE — Assessment & Plan Note (Signed)
Controlled, no change in medication  

## 2014-12-16 NOTE — Assessment & Plan Note (Signed)
Vaccine administered at visit.  

## 2014-12-16 NOTE — Assessment & Plan Note (Signed)
Improved , though still not at goal Hyperlipidemia:Low fat diet discussed and encouraged.

## 2014-12-20 ENCOUNTER — Telehealth: Payer: Self-pay | Admitting: Family Medicine

## 2014-12-21 NOTE — Telephone Encounter (Signed)
Some swelling in her legs- Told to elevate her legs whenever possible and to call back week of March 8 if still having problems

## 2014-12-25 ENCOUNTER — Other Ambulatory Visit: Payer: Self-pay | Admitting: Family Medicine

## 2015-01-01 ENCOUNTER — Other Ambulatory Visit: Payer: Self-pay | Admitting: Family Medicine

## 2015-01-14 ENCOUNTER — Ambulatory Visit (INDEPENDENT_AMBULATORY_CARE_PROVIDER_SITE_OTHER): Payer: BLUE CROSS/BLUE SHIELD | Admitting: Family Medicine

## 2015-01-14 ENCOUNTER — Encounter: Payer: Self-pay | Admitting: Family Medicine

## 2015-01-14 VITALS — BP 140/80 | HR 91 | Resp 16 | Ht 64.0 in | Wt 138.0 lb

## 2015-01-14 DIAGNOSIS — I10 Essential (primary) hypertension: Secondary | ICD-10-CM | POA: Diagnosis not present

## 2015-01-14 DIAGNOSIS — E785 Hyperlipidemia, unspecified: Secondary | ICD-10-CM

## 2015-01-14 DIAGNOSIS — F329 Major depressive disorder, single episode, unspecified: Secondary | ICD-10-CM | POA: Diagnosis not present

## 2015-01-14 DIAGNOSIS — J449 Chronic obstructive pulmonary disease, unspecified: Secondary | ICD-10-CM

## 2015-01-14 DIAGNOSIS — G4700 Insomnia, unspecified: Secondary | ICD-10-CM

## 2015-01-14 DIAGNOSIS — F32A Depression, unspecified: Secondary | ICD-10-CM

## 2015-01-14 DIAGNOSIS — Z9189 Other specified personal risk factors, not elsewhere classified: Secondary | ICD-10-CM

## 2015-01-14 DIAGNOSIS — J4489 Other specified chronic obstructive pulmonary disease: Secondary | ICD-10-CM

## 2015-01-14 MED ORDER — METOPROLOL TARTRATE 25 MG PO TABS: 25.0000 mg | ORAL_TABLET | Freq: Two times a day (BID) | ORAL | Status: AC

## 2015-01-14 NOTE — Patient Instructions (Addendum)
F/u in July as before  You ned to take the remron every night to help with depression and anxiety, sorry to hear of your recent loss  As you say , when you take your med as you should , then you feel well

## 2015-02-24 NOTE — Assessment & Plan Note (Signed)
Not at goal. No med change, conmpliance is stressed DASH diet and commitment to daily physical activity for a minimum of 30 minutes discussed and encouraged, as a part of hypertension management. The importance of attaining a healthy weight is also discussed.  BP/Weight 01/14/2015 12/13/2014 08/08/2014 01/04/2014 11/17/2013 07/28/2013 02/01/1447  Systolic BP 185 631 497 [phone] 502  Diastolic BP 80 82 72 70 72 62 72  Wt. (Lbs) 138 136 134 137.12 132.08 132 128  BMI 23.68 23.33 22.99 23.91 23.03 23.01 22.32

## 2015-02-24 NOTE — Assessment & Plan Note (Signed)
Poorly controlled as pt cannot afford chronic meds needed

## 2015-02-24 NOTE — Assessment & Plan Note (Signed)
Again reminbded pt of the need to have her colonoscopy, still states has no one to transport her

## 2015-02-24 NOTE — Progress Notes (Signed)
Subjective:    Patient ID: Angela Duncan, female    DOB: [birth year], 64 y.o.   MRN: [id number]  HPI The PT is here for follow up and re-evaluation of chronic medical conditions, medication management and review of any available recent lab and radiology data. She was asked to see me re depression for management. Pt states that her main reason for feeling depressed is lack of money to get the medication she needs for her breathing, as she had tried to explain to her pulmonary Doc Also recently lost a fmily member who had been very ill for some time , but she is grieving in an appropriate way Preventive health is updated, specifically  Cancer screening and Immunization.   Questions or concerns regarding consultations or procedures which the PT has had in the interim are  addressed. The PT denies any adverse reactions to current medications since the last visit.      Review of Systems See HPI Denies recent fever or chills. Denies sinus pressure, nasal congestion, ear pain or sore throat. Denies chest congestion, productive cough has chronic  Wheezing and shortness of breath with minimal activity Denies chest pains, palpitations and leg swelling Denies abdominal pain, nausea, vomiting,diarrhea or constipation.   Denies dysuria, frequency, hesitancy or incontinence. Denies joint pain, swelling and limitation in mobility. Denies headaches, seizures, numbness, or tingling. Denies uncontrolled  depression, anxiety or insomnia. Denies skin break down or rash.        Objective:   Physical Exam BP 140/80 mmHg  Pulse 91  Resp 16  Ht 5\' 4"  (1.626 m)  Wt 138 lb (62.596 kg)  BMI 23.68 kg/m2  SpO2 98% Patient alert and oriented and in no cardiopulmonary distress.  HEENT: No facial asymmetry, EOMI,   oropharynx pink and moist.  Neck supple no JVD, no mass.  Chest: Clear to auscultation bilaterally.Decreased air entry throughout few wheezes  CVS: S1, S2 no murmurs, no S3.Regular  rate.  ABD: Soft non tender.   Ext: No edema  MS: Adequate ROM spine, shoulders, hips and knees.  Skin: Intact, no ulcerations or rash noted.  Psych: Good eye contact, normal affect. Memory intact not anxious or depressed appearing.  CNS: CN 2-12 intact, power,  normal throughout.no focal deficits noted.        Assessment & Plan:  Essential hypertension Not at goal. No med change, conmpliance is stressed DASH diet and commitment to daily physical activity for a minimum of 30 minutes discussed and encouraged, as a part of hypertension management. The importance of attaining a healthy weight is also discussed.  BP/Weight 01/14/2015 12/13/2014 08/08/2014 01/04/2014 11/17/2013 07/28/2013 6/31/4970  Systolic BP 263 785 885 [phone]  Diastolic BP 80 82 72 70 72 62 72  Wt. (Lbs) 138 136 134 137.12 132.08 132 128  BMI 23.68 23.33 22.99 23.91 23.03 23.01 22.32         Depression Controlled, no change in medication    Hyperlipidemia LDL goal <100 Hyperlipidemia:Low fat diet discussed and encouraged. Uncontrolled, medication adjustment made   Lipid Panel  Lab Results  Component Value Date   CHOL 211* 11/30/2014   HDL 43 11/30/2014   LDLCALC 119* 11/30/2014   TRIG 246* 11/30/2014   CHOLHDL 4.9 11/30/2014         Insomnia Sleep hygiene reviewed and written information offered also. Prescription sent for  medication needed.    COPD with asthma Poorly controlled as pt cannot afford chronic meds needed   Colon  cancer high risk Again reminbded pt of the need to have her colonoscopy, still states has no one to transport her

## 2015-02-24 NOTE — Assessment & Plan Note (Signed)
Controlled, no change in medication  

## 2015-02-24 NOTE — Assessment & Plan Note (Signed)
Sleep hygiene reviewed and written information offered also. Prescription sent for  medication needed.  

## 2015-02-24 NOTE — Assessment & Plan Note (Signed)
Hyperlipidemia:Low fat diet discussed and encouraged. Uncontrolled, medication adjustment made   Lipid Panel  Lab Results  Component Value Date   CHOL 211* 11/30/2014   HDL 43 11/30/2014   LDLCALC 119* 11/30/2014   TRIG 246* 11/30/2014   CHOLHDL 4.9 11/30/2014

## 2015-03-21 ENCOUNTER — Other Ambulatory Visit: Payer: Self-pay | Admitting: Family Medicine

## 2015-03-21 DIAGNOSIS — Z1231 Encounter for screening mammogram for malignant neoplasm of breast: Secondary | ICD-10-CM

## 2015-03-26 ENCOUNTER — Other Ambulatory Visit: Payer: Self-pay | Admitting: Family Medicine

## 2015-04-22 ENCOUNTER — Ambulatory Visit (HOSPITAL_COMMUNITY): Payer: BLUE CROSS/BLUE SHIELD

## 2015-04-30 ENCOUNTER — Other Ambulatory Visit: Payer: Self-pay | Admitting: Family Medicine

## 2015-05-09 ENCOUNTER — Ambulatory Visit: Payer: BLUE CROSS/BLUE SHIELD | Admitting: Family Medicine

## 2015-05-29 ENCOUNTER — Other Ambulatory Visit: Payer: Self-pay | Admitting: Family Medicine

## 2015-07-15 ENCOUNTER — Other Ambulatory Visit: Payer: Self-pay | Admitting: Family Medicine

## 2017-11-10 ENCOUNTER — Other Ambulatory Visit: Payer: Self-pay

## 2017-11-10 ENCOUNTER — Emergency Department (HOSPITAL_COMMUNITY): Payer: Medicare HMO

## 2017-11-10 ENCOUNTER — Inpatient Hospital Stay (HOSPITAL_COMMUNITY)
Admission: EM | Admit: 2017-11-10 | Discharge: 2017-11-16 | DRG: 640 | Disposition: A | Discharge status: Home Health | Payer: Medicare HMO | Attending: Internal Medicine | Admitting: Internal Medicine

## 2017-11-10 ENCOUNTER — Encounter (HOSPITAL_COMMUNITY): Payer: Self-pay

## 2017-11-10 DIAGNOSIS — R0902 Hypoxemia: Secondary | ICD-10-CM

## 2017-11-10 DIAGNOSIS — L89152 Pressure ulcer of sacral region, stage 2: Secondary | ICD-10-CM | POA: Diagnosis present

## 2017-11-10 DIAGNOSIS — Z9119 Patient's noncompliance with other medical treatment and regimen: Secondary | ICD-10-CM

## 2017-11-10 DIAGNOSIS — Z79899 Other long term (current) drug therapy: Secondary | ICD-10-CM

## 2017-11-10 DIAGNOSIS — D649 Anemia, unspecified: Secondary | ICD-10-CM | POA: Diagnosis present

## 2017-11-10 DIAGNOSIS — E871 Hypo-osmolality and hyponatremia: Secondary | ICD-10-CM | POA: Diagnosis present

## 2017-11-10 DIAGNOSIS — G9341 Metabolic encephalopathy: Secondary | ICD-10-CM | POA: Diagnosis present

## 2017-11-10 DIAGNOSIS — R4182 Altered mental status, unspecified: Secondary | ICD-10-CM

## 2017-11-10 DIAGNOSIS — F22 Delusional disorders: Secondary | ICD-10-CM | POA: Diagnosis present

## 2017-11-10 DIAGNOSIS — L8990 Pressure ulcer of unspecified site, unspecified stage: Secondary | ICD-10-CM

## 2017-11-10 DIAGNOSIS — F419 Anxiety disorder, unspecified: Secondary | ICD-10-CM | POA: Diagnosis present

## 2017-11-10 DIAGNOSIS — E876 Hypokalemia: Secondary | ICD-10-CM | POA: Diagnosis not present

## 2017-11-10 DIAGNOSIS — J45909 Unspecified asthma, uncomplicated: Secondary | ICD-10-CM | POA: Diagnosis present

## 2017-11-10 DIAGNOSIS — Z7951 Long term (current) use of inhaled steroids: Secondary | ICD-10-CM

## 2017-11-10 DIAGNOSIS — I959 Hypotension, unspecified: Secondary | ICD-10-CM | POA: Diagnosis present

## 2017-11-10 DIAGNOSIS — I10 Essential (primary) hypertension: Secondary | ICD-10-CM | POA: Diagnosis present

## 2017-11-10 DIAGNOSIS — Z982 Presence of cerebrospinal fluid drainage device: Secondary | ICD-10-CM | POA: Diagnosis not present

## 2017-11-10 DIAGNOSIS — Z7982 Long term (current) use of aspirin: Secondary | ICD-10-CM

## 2017-11-10 DIAGNOSIS — Z90710 Acquired absence of both cervix and uterus: Secondary | ICD-10-CM | POA: Diagnosis not present

## 2017-11-10 DIAGNOSIS — F329 Major depressive disorder, single episode, unspecified: Secondary | ICD-10-CM | POA: Diagnosis present

## 2017-11-10 DIAGNOSIS — T502X5A Adverse effect of carbonic-anhydrase inhibitors, benzothiadiazides and other diuretics, initial encounter: Secondary | ICD-10-CM | POA: Diagnosis present

## 2017-11-10 DIAGNOSIS — I428 Other cardiomyopathies: Secondary | ICD-10-CM | POA: Diagnosis present

## 2017-11-10 DIAGNOSIS — Z9049 Acquired absence of other specified parts of digestive tract: Secondary | ICD-10-CM | POA: Diagnosis not present

## 2017-11-10 LAB — COMPREHENSIVE METABOLIC PANEL
ALBUMIN: 2.7 g/dL — AB (ref 3.5–5.0)
ALK PHOS: 51 U/L (ref 38–126)
ALT: 22 U/L (ref 14–54)
ANION GAP: 12 (ref 5–15)
AST: 34 U/L (ref 15–41)
BILIRUBIN TOTAL: 1.7 mg/dL — AB (ref 0.3–1.2)
BUN: 13 mg/dL (ref 6–20)
CALCIUM: 7.9 mg/dL — AB (ref 8.9–10.3)
CO2: 24 mmol/L (ref 22–32)
Chloride: 75 mmol/L — ABNORMAL LOW (ref 101–111)
Creatinine, Ser: 0.54 mg/dL (ref 0.44–1.00)
GFR calc Af Amer: >60 mL/min (ref >60)
GFR calc non Af Amer: >60 mL/min (ref >60)
GLUCOSE: 93 mg/dL (ref 65–99)
POTASSIUM: 3.2 mmol/L — AB (ref 3.5–5.1)
SODIUM: 111 mmol/L — AB (ref 135–145)
TOTAL PROTEIN: 5 g/dL — AB (ref 6.5–8.1)

## 2017-11-10 LAB — BASIC METABOLIC PANEL
ANION GAP: 9 (ref 5–15)
ANION GAP: 10 (ref 5–15)
Anion gap: 10 (ref 5–15)
Anion gap: 11 (ref 5–15)
BUN: 11 mg/dL (ref 6–20)
BUN: 11 mg/dL (ref 6–20)
BUN: 11 mg/dL (ref 6–20)
BUN: 10 mg/dL (ref 6–20)
CALCIUM: 7.4 mg/dL — AB (ref 8.9–10.3)
CALCIUM: 7.2 mg/dL — AB (ref 8.9–10.3)
CO2: 24 mmol/L (ref 22–32)
CO2: 21 mmol/L — ABNORMAL LOW (ref 22–32)
CO2: 20 mmol/L — ABNORMAL LOW (ref 22–32)
CO2: 17 mmol/L — AB (ref 22–32)
CREATININE: 0.47 mg/dL (ref 0.44–1.00)
CREATININE: 0.52 mg/dL (ref 0.44–1.00)
CREATININE: 0.48 mg/dL (ref 0.44–1.00)
Calcium: 7.1 mg/dL — ABNORMAL LOW (ref 8.9–10.3)
Calcium: 7.2 mg/dL — ABNORMAL LOW (ref 8.9–10.3)
Chloride: 79 mmol/L — ABNORMAL LOW (ref 101–111)
Chloride: 84 mmol/L — ABNORMAL LOW (ref 101–111)
Chloride: 86 mmol/L — ABNORMAL LOW (ref 101–111)
Chloride: 90 mmol/L — ABNORMAL LOW (ref 101–111)
Creatinine, Ser: 0.59 mg/dL (ref 0.44–1.00)
GFR calc Af Amer: >60 mL/min (ref >60)
GFR calc Af Amer: >60 mL/min (ref >60)
GFR calc Af Amer: >60 mL/min (ref >60)
GFR calc Af Amer: >60 mL/min (ref >60)
GLUCOSE: 75 mg/dL (ref 65–99)
GLUCOSE: 70 mg/dL (ref 65–99)
GLUCOSE: 67 mg/dL (ref 65–99)
GLUCOSE: 64 mg/dL — AB (ref 65–99)
Potassium: 2.8 mmol/L — ABNORMAL LOW (ref 3.5–5.1)
Potassium: 3.6 mmol/L (ref 3.5–5.1)
Potassium: 3.2 mmol/L — ABNORMAL LOW (ref 3.5–5.1)
Potassium: 3.2 mmol/L — ABNORMAL LOW (ref 3.5–5.1)
Sodium: 112 mmol/L — CL (ref 135–145)
Sodium: 115 mmol/L — CL (ref 135–145)
Sodium: 116 mmol/L — CL (ref 135–145)
Sodium: 118 mmol/L — CL (ref 135–145)

## 2017-11-10 LAB — RAPID URINE DRUG SCREEN, HOSP PERFORMED
AMPHETAMINES: NOT DETECTED
BARBITURATES: NOT DETECTED
BENZODIAZEPINES: NOT DETECTED
Cocaine: NOT DETECTED
Opiates: POSITIVE — AB
Tetrahydrocannabinol: NOT DETECTED

## 2017-11-10 LAB — CBC WITH DIFFERENTIAL/PLATELET
BASOS ABS: 0 10*3/uL (ref 0.0–0.1)
BASOS PCT: 0 %
Eosinophils Absolute: 0 10*3/uL (ref 0.0–0.7)
Eosinophils Relative: 1 %
HEMATOCRIT: 31.3 % — AB (ref 36.0–46.0)
HEMOGLOBIN: 11.8 g/dL — AB (ref 12.0–15.0)
LYMPHS PCT: 6 %
Lymphs Abs: 0.5 10*3/uL — ABNORMAL LOW (ref 0.7–4.0)
MCH: 34.8 pg — ABNORMAL HIGH (ref 26.0–34.0)
MCHC: 37.7 g/dL — AB (ref 30.0–36.0)
MCV: 92.3 fL (ref 78.0–100.0)
Monocytes Absolute: 0.8 10*3/uL (ref 0.1–1.0)
Monocytes Relative: 11 %
NEUTROS ABS: 6 10*3/uL (ref 1.7–7.7)
NEUTROS PCT: 82 %
Platelets: 326 10*3/uL (ref 150–400)
RBC: 3.39 MIL/uL — AB (ref 3.87–5.11)
RDW: 12 % (ref 11.5–15.5)
WBC: 7.4 10*3/uL (ref 4.0–10.5)

## 2017-11-10 LAB — URINALYSIS, ROUTINE W REFLEX MICROSCOPIC
Bilirubin Urine: NEGATIVE
GLUCOSE, UA: 50 mg/dL — AB
KETONES UR: 5 mg/dL — AB
Leukocytes, UA: NEGATIVE
Nitrite: NEGATIVE
PROTEIN: NEGATIVE mg/dL
RBC / HPF: NONE SEEN RBC/hpf (ref 0–5)
SQUAMOUS EPITHELIAL / LPF: NONE SEEN
Specific Gravity, Urine: 1.011 (ref 1.005–1.030)
pH: 6 (ref 5.0–8.0)

## 2017-11-10 LAB — AMMONIA: AMMONIA: 15 umol/L (ref 9–35)

## 2017-11-10 LAB — I-STAT TROPONIN, ED: Troponin i, poc: 0.24 ng/mL (ref 0.00–0.08)

## 2017-11-10 LAB — I-STAT CG4 LACTIC ACID, ED
LACTIC ACID, VENOUS: 0.8 mmol/L (ref 0.5–1.9)
Lactic Acid, Venous: 0.75 mmol/L (ref 0.5–1.9)

## 2017-11-10 LAB — ETHANOL: Alcohol, Ethyl (B): <10 mg/dL (ref <10)

## 2017-11-10 LAB — TROPONIN I
TROPONIN I: 0.26 ng/mL — AB (ref <0.03)
TROPONIN I: 0.17 ng/mL — AB (ref <0.03)

## 2017-11-10 LAB — MRSA PCR SCREENING: MRSA by PCR: NEGATIVE

## 2017-11-10 LAB — ACETAMINOPHEN LEVEL

## 2017-11-10 LAB — SALICYLATE LEVEL

## 2017-11-10 LAB — SODIUM, URINE, RANDOM: Sodium, Ur: 34 mmol/L

## 2017-11-10 MED ORDER — SODIUM CHLORIDE 0.9 % IV SOLN
INTRAVENOUS | Status: AC
  Administered 2017-11-10 (×2): via INTRAVENOUS

## 2017-11-10 MED ORDER — SODIUM CHLORIDE 0.9 % IV BOLUS (SEPSIS)
1000.0000 mL | Freq: Once | INTRAVENOUS | Status: AC
  Administered 2017-11-10: 1000 mL via INTRAVENOUS

## 2017-11-10 MED ORDER — POTASSIUM CHLORIDE CRYS ER 20 MEQ PO TBCR
40.0000 meq | EXTENDED_RELEASE_TABLET | ORAL | Status: AC
  Administered 2017-11-10 (×2): 40 meq via ORAL
  Filled 2017-11-10 (×2): qty 2

## 2017-11-10 MED ORDER — SODIUM CHLORIDE 0.9 % IV SOLN: 250.0000 mL | INTRAVENOUS | Status: DC | PRN

## 2017-11-10 MED ORDER — POTASSIUM CHLORIDE 10 MEQ/100ML IV SOLN
10.0000 meq | INTRAVENOUS | Status: AC
  Administered 2017-11-10 (×4): 10 meq via INTRAVENOUS
  Filled 2017-11-10 (×3): qty 100

## 2017-11-10 MED ORDER — METOPROLOL TARTRATE 25 MG PO TABS
25.0000 mg | ORAL_TABLET | Freq: Two times a day (BID) | ORAL | Status: DC
  Administered 2017-11-10 – 2017-11-16 (×12): 25 mg via ORAL
  Filled 2017-11-10 (×12): qty 1

## 2017-11-10 MED ORDER — AMLODIPINE BESYLATE 10 MG PO TABS
10.0000 mg | ORAL_TABLET | Freq: Every day | ORAL | Status: DC
  Administered 2017-11-10 – 2017-11-16 (×7): 10 mg via ORAL
  Filled 2017-11-10 (×7): qty 1

## 2017-11-10 MED ORDER — HEPARIN SODIUM (PORCINE) 5000 UNIT/ML IJ SOLN
5000.0000 [IU] | Freq: Three times a day (TID) | INTRAMUSCULAR | Status: DC
  Administered 2017-11-10 – 2017-11-16 (×17): 5000 [IU] via SUBCUTANEOUS
  Filled 2017-11-10 (×18): qty 1

## 2017-11-10 MED ORDER — MIRTAZAPINE 15 MG PO TABS
7.5000 mg | ORAL_TABLET | Freq: Every day | ORAL | Status: DC
  Administered 2017-11-10 – 2017-11-15 (×6): 7.5 mg via ORAL
  Filled 2017-11-10 (×6): qty 1

## 2017-11-10 NOTE — ED Provider Notes (Addendum)
[facility] DEPT Provider Note   CSN: [id number] Arrival date & time: 11/10/17  4098     History   Chief Complaint Chief Complaint  Patient presents with  . Hallucinations    HPI Angela Duncan is a 67 y.o. female.  Patient with PMH of HTN, anxiety, and asthma presents to the ED with a chief complaint of altered mental status.  Per EMS, patient was recently diagnosed with delirium.  EMS reports that they were called out by family due to patient having visual hallucinations, agitation, and paranoia.  She was given 5 mg of Haldol by EMS.  No other history is able to be obtained at this time.  Level 5 caveat applies.   The history is provided by the patient. No language interpreter was used.    Past Medical History:  Diagnosis Date  . Anxiety    manged by Dr. Luan Pulling   . Asthma 1993   managed by Dr. Luan Pulling   . Depression    managed by Dr. Luan Pulling   . Hydrocephalus 2010  . Hypertension   . [place] spotted fever 2010  . 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 Depression 06/15/2013  . COPD with asthma ([place]) 06/15/2013  . Colon cancer high risk 05/31/2012  . Insomnia 04/04/2011  . 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 VITAMIN D DEFICIENCY 08/12/2010  . Hyperlipidemia LDL goal <100 05/06/2010  . Essential hypertension 02/25/2010    Past Surgical History:  Procedure Laterality Date  . cholecystectomy : stones    . partial hysterectomy - due to bleeding issues    . Shunt placement in head  2010    OB History    No data available       Home Medications    Prior to Admission medications   Medication Sig Start Date End Date Taking? Authorizing Provider  albuterol (PROVENTIL HFA;VENTOLIN HFA) 108 (90 BASE) MCG/ACT inhaler Inhale 2 puffs into the lungs every 6 (six) hours as needed for wheezing or shortness of breath.     [provider]  amLODipine (NORVASC) 10  MG tablet TAKE ONE TABLET BY MOUTH DAILY 04/30/15   Fayrene Helper, MD  aspirin (ASPIRIN LOW DOSE) 81 MG EC tablet Take 81 mg by mouth daily.      [provider]  budesonide-formoterol (SYMBICORT) 160-4.5 MCG/ACT inhaler Inhale 2 puffs into the lungs 2 (two) times daily.    [provider]  docusate sodium (COLACE) 100 MG capsule Take 200 mg by mouth 2 (two) times daily.      [provider]  hydrochlorothiazide (HYDRODIURIL) 25 MG tablet Take 1 tablet (25 mg total) by mouth daily. 01/01/14   Fayrene Helper, MD  lisinopril (PRINIVIL,ZESTRIL) 40 MG tablet TAKE ONE TABLET BY MOUTH EVERY DAY 04/30/15   Fayrene Helper, MD  lovastatin (MEVACOR) 40 MG tablet TAKE ONE TABLET BY MOUTH AT BEDTIME **D/C CRESTOR** 12/25/14   Fayrene Helper, MD  metoprolol tartrate (LOPRESSOR) 25 MG tablet Take 1 tablet (25 mg total) by mouth 2 (two) times daily. 01/14/15   Fayrene Helper, MD  mirtazapine (REMERON) 15 MG tablet TAKE 1/2 TABLET BY MOUTH EVERY DAY AT BEDTIME 03/26/15   Fayrene Helper, MD  nitroGLYCERIN (NITROSTAT) 0.4 MG SL tablet Place 0.4 mg under the tongue as directed.      [provider]    Family History Family History  Problem Relation Age  of Onset  . Diabetes Mother   . Hypertension Mother   . Thyroid cancer Mother   . Kidney failure Mother   . Hypertension Father   . Heart disease Brother        Stent in heart, also has blocklage in neck   . Throat cancer Sister   . Hypertension Sister   . Hyperlipidemia Sister   . Thyroid disease Sister     Social History Social History   Tobacco Use  . Smoking status: Never Smoker  Substance Use Topics  . Alcohol use: Yes  . Drug use: No     Allergies   Penicillins   Review of Systems Review of Systems  Unable to perform ROS: Mental status change     Physical Exam Updated Vital Signs BP (!) 82/46 (BP Location: Left Arm)   Pulse (!) 57   Temp (!) 97.4 F (36.3 C) (Axillary)    Resp 18   SpO2 96%   Physical Exam  Constitutional: She is oriented to person, place, and time. She appears well-developed and well-nourished.  HENT:  Head: Normocephalic and atraumatic.  edentulous  Eyes: Conjunctivae and EOM are normal. Pupils are equal, round, and reactive to light.  Neck: Normal range of motion. Neck supple.  Cardiovascular: Normal rate and regular rhythm. Exam reveals no gallop and no friction rub.  No murmur heard. Pulmonary/Chest: Effort normal and breath sounds normal. No respiratory distress. She has no wheezes. She has no rales. She exhibits no tenderness.  Abdominal: Soft. Bowel sounds are normal. She exhibits no distension and no mass. There is no tenderness. There is no rebound and no guarding.  Musculoskeletal: Normal range of motion. She exhibits no edema or tenderness.  Neurological: She is alert and oriented to person, place, and time.  Skin: Skin is warm and dry.  Psychiatric: She has a normal mood and affect. Her behavior is normal. Judgment and thought content normal.  Nursing note and vitals reviewed.    ED Treatments / Results  Labs (all labs ordered are listed, but only abnormal results are displayed) Labs Reviewed  URINALYSIS, ROUTINE W REFLEX MICROSCOPIC - Abnormal; Notable for the following components:      Result Value   Glucose, UA 50 (*)    Hgb urine dipstick SMALL (*)    Ketones, ur 5 (*)    Bacteria, UA RARE (*)    All other components within normal limits  CBC WITH DIFFERENTIAL/PLATELET - Abnormal; Notable for the following components:   RBC 3.39 (*)    Hemoglobin 11.8 (*)    HCT 31.3 (*)    MCH 34.8 (*)    MCHC 37.7 (*)    Lymphs Abs 0.5 (*)    All other components within normal limits  COMPREHENSIVE METABOLIC PANEL - Abnormal; Notable for the following components:   Sodium 111 (*)    Potassium 3.2 (*)    Chloride 75 (*)    Calcium 7.9 (*)    Total Protein 5.0 (*)    Albumin 2.7 (*)    Total Bilirubin 1.7 (*)    All  other components within normal limits  RAPID URINE DRUG SCREEN, HOSP PERFORMED - Abnormal; Notable for the following components:   Opiates POSITIVE (*)    All other components within normal limits  ACETAMINOPHEN LEVEL - Abnormal; Notable for the following components:   Acetaminophen (Tylenol), Serum <10 (*)    All other components within normal limits  TROPONIN I - Abnormal; Notable for the following  components:   Troponin I 0.26 (*)    All other components within normal limits  TROPONIN I - Abnormal; Notable for the following components:   Troponin I 0.17 (*)    All other components within normal limits  BASIC METABOLIC PANEL - Abnormal; Notable for the following components:   Sodium 112 (*)    Potassium 2.8 (*)    Chloride 79 (*)    Calcium 7.4 (*)    All other components within normal limits  BASIC METABOLIC PANEL - Abnormal; Notable for the following components:   Sodium 115 (*)    Chloride 84 (*)    CO2 21 (*)    Calcium 7.2 (*)    All other components within normal limits  BASIC METABOLIC PANEL - Abnormal; Notable for the following components:   Sodium 116 (*)    Potassium 3.2 (*)    Chloride 86 (*)    CO2 20 (*)    Calcium 7.1 (*)    All other components within normal limits  BASIC METABOLIC PANEL - Abnormal; Notable for the following components:   Sodium 118 (*)    Potassium 3.2 (*)    Chloride 90 (*)    CO2 17 (*)    Glucose, Bld 64 (*)    Calcium 7.2 (*)    All other components within normal limits  I-STAT TROPONIN, ED - Abnormal; Notable for the following components:   Troponin i, poc 0.24 (*)    All other components within normal limits  MRSA PCR SCREENING  SALICYLATE LEVEL  ETHANOL  AMMONIA  SODIUM, URINE, RANDOM  BASIC METABOLIC PANEL  BASIC METABOLIC PANEL  CBC  MAGNESIUM  PHOSPHORUS  CBG MONITORING, ED  I-STAT CG4 LACTIC ACID, ED  I-STAT CG4 LACTIC ACID, ED    EKG  EKG Interpretation  Date/Time:  Wednesday November 10 2017 04:33:51  EST Ventricular Rate:  65 PR Interval:    QRS Duration: 99 QT Interval:  484 QTC Calculation: 504 R Axis:   68 Text Interpretation:  Sinus rhythm Borderline short PR interval RSR' in V1 or V2, probably normal variant Minimal ST depression, inferior leads Prolonged QT interval Confirmed by Veryl Speak [phone]) on 11/10/2017 4:40:21 AM       Radiology Dg Chest 2 View  Result Date: 11/10/2017 CLINICAL DATA:  67 year old female with altered mental status. EXAM: CHEST  2 VIEW COMPARISON:  None. FINDINGS: Left IJ central venous line with tip over the spine just below the diaphragm. There is mild emphysematous changes of the lungs. No focal consolidation, pleural effusion, or pneumothorax. The cardiac silhouette is within normal limits. No acute osseous pathology. IMPRESSION: No active cardiopulmonary disease. Electronically Signed   By: Anner Crete M.D.   On: 11/10/2017 04:33   Ct Head Wo Contrast  Result Date: 11/10/2017 CLINICAL DATA:  67 y/o F; visual hallucinations, agitation, increasing paranoia, recent diagnosis of delirium. EXAM: CT HEAD WITHOUT CONTRAST TECHNIQUE: Contiguous axial images were obtained from the base of the skull through the vertex without intravenous contrast. COMPARISON:  12/26/2013 CT head FINDINGS: Brain: No evidence of acute infarction, hemorrhage, hydrocephalus, extra-axial collection or mass lesion/mass effect. Left parietal approach ventriculostomy catheter with tip and right lateral ventricle near foramen of Monro, stable. Stable ventricle size. Stable chronic infarct of corpus callosum. Interval progression of moderate chronic microvascular ischemic changes and mild parenchymal volume loss of the brain. Vascular: No hyperdense vessel or unexpected calcification. Skull: Normal. Negative for fracture or focal lesion. Sinuses/Orbits: Small fluid levels in  the maxillary sinuses. Otherwise negative. Other: None. IMPRESSION: 1. No acute intracranial abnormality  identified. 2. Progression of moderate chronic microvascular ischemic changes. Stable chronic infarct of corpus callosum. 3. Stable position of left parietal approach ventriculostomy catheter. Stable ventricle size. 4. Fluid levels in maxillary sinus may represent acute sinusitis. Electronically Signed   By: Kristine Garbe M.D.   On: 11/10/2017 04:52    Procedures Procedures (including critical care time) CRITICAL CARE Performed by: Montine Circle   Total critical care time: 46 minutes  Critical care time was exclusive of separately billable procedures and treating other patients.  Critical care was necessary to treat or prevent imminent or life-threatening deterioration.  Critical care was time spent personally by me on the following activities: development of treatment plan with patient and/or surrogate as well as nursing, discussions with consultants, evaluation of patient's response to treatment, examination of patient, obtaining history from patient or surrogate, ordering and performing treatments and interventions, ordering and review of laboratory studies, ordering and review of radiographic studies, pulse oximetry and re-evaluation of patient's condition.  Medications Ordered in ED Medications  sodium chloride 0.9 % bolus 1,000 mL (not administered)     Initial Impression / Assessment and Plan / ED Course  I have reviewed the triage vital signs and the nursing notes.  Pertinent labs & imaging results that were available during my care of the patient were reviewed by me and considered in my medical decision making (see chart for details).  Clinical Course as of Nov 10 2201  Wed Nov 10, 2017  1705 Sodium 116- critical result called, given to charge nurse.   [EH]    Clinical Course User Index [EH] Lorin Glass, PA-C    Patient with AMS.  Noted to by hypotensive.  Will give fluids.  Will check labs and imaging.  Will reassess.  Troponin is 0.24.  She has  been mildly elevated in the past.  Could be some demand ischemia, as she was a little hypotensive on arrival.  No ischemic EKG changes.  Will trend.  BMP is remarkable for sodium of 111.  Patient takes hydrodiuril and remeron.  Still altered.  No seizure activity.  I discussed the case with critical care Dr. Oletta Darter, who will have the critical care team see the patient.  States that this will likely take several hours to get to patient due to acuity of patients elsewhere.  Gives recommendations of NS 125 ml/hr continuously and rechecking BMP in 4 hours.    Hold hydrodiuril and remeron.    Final Clinical Impressions(s) / ED Diagnoses   Final diagnoses:  Altered mental status, unspecified altered mental status type  Hyponatremia    ED Discharge Orders    None         Montine Circle, PA-C 11/10/17 [id number]    Veryl Speak, MD 11/10/17 0629    Montine Circle, PA-C 11/10/17 2203    Veryl Speak, MD 11/10/17 2300

## 2017-11-10 NOTE — Progress Notes (Signed)
eLink Physician-Brief Progress Note Patient Name: Angela Duncan DOB: 02/01/1951 MRN: [id number]   Date of Service  11/10/2017  HPI/Events of Note  Patient on several antihypertensive medications admitted with hyponatremia with AMS.  Given NS with increase in Na to 118 tonight.  Now A&O.  C/O of insomnia  eICU Interventions  Restart norvasc and lopressor Consider PRN for hypertension Hold ACE and HCTZ Home Remeron ordered     Intervention Category Intermediate Interventions: Hypertension - evaluation and management  DETERDING,ELIZABETH 11/10/2017, 9:22 PM

## 2017-11-10 NOTE — ED Triage Notes (Addendum)
BIB EMS from Home, EMS reports pt responding to visual halluicnations, agitation, and increasing paranoia. EMS administered 5mg  of IV Haldol. Pt appears to be sleeping upon arrival, she is arrousable to physical stimulus. Pt recently dx'd w/ Delirium.   EMS Vitals BP 100/60 P 63 SPO2 99% on RA CBG 113 RR 16

## 2017-11-10 NOTE — ED Notes (Signed)
ED TO INPATIENT HANDOFF REPORT  Name/Age/Gender Angela Duncan 67 y.o. female  Code Status    Code Status Orders  (From admission, onward)        Start     Ordered   11/10/17 0634  Full code  Continuous     11/10/17 0634    Code Status History    Date Active Date Inactive Code Status Order ID Comments User Context   This patient has a current code status but no historical code status.      Home/SNF/Other Home  Chief Complaint Hallucination  Level of Care/Admitting Diagnosis ED Disposition    ED Disposition Condition [place] [facility] Area: [facility] [[id number]]  Level of Care: ICU [6]  Diagnosis: Hyponatremia [001749]  Admitting Physician: Kandice Hams [[id number]]  Attending Physician: PCCM, MD [phone]  Estimated length of stay: 3 - 4 days  Certification:: I certify this patient will need inpatient services for at least 2 midnights  PT Class (Do Not Modify): Inpatient [101]  PT Acc Code (Do Not Modify): Private [1]       Medical History Past Medical History:  Diagnosis Date  . Anxiety    manged by Dr. Luan Pulling   . Asthma 1993   managed by Dr. Luan Pulling   . Depression    managed by Dr. Luan Pulling   . Hydrocephalus 2010  . Hypertension   . [place] spotted fever 2010  . 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 Penicillins     Made her sleep all day    IV Location/Drains/Wounds Patient Lines/Drains/Airways Status   Active Line/Drains/Airways    None          Labs/Imaging Results for orders placed or performed during the hospital encounter of 11/10/17 (from the past 48 hour(s))  CBC with Differential/Platelet     Status: Abnormal   Collection Time: 11/10/17  3:49 AM  Result Value Ref Range   WBC 7.4 4.0 - 10.5 K/uL   RBC 3.39 (L) 3.87 - 5.11 MIL/uL   Hemoglobin 11.8 (L) 12.0 - 15.0 g/dL   HCT 31.3 (L) 36.0 - 46.0 %   MCV 92.3 78.0 - 100.0 fL   MCH 34.8 (H) 26.0  - 34.0 pg   MCHC 37.7 (H) 30.0 - 36.0 g/dL    Comment: RULED OUT INTERFERING SUBSTANCES   RDW 12.0 11.5 - 15.5 %   Platelets 326 150 - 400 K/uL   Neutrophils Relative % 82 %   Neutro Abs 6.0 1.7 - 7.7 K/uL   Lymphocytes Relative 6 %   Lymphs Abs 0.5 (L) 0.7 - 4.0 K/uL   Monocytes Relative 11 %   Monocytes Absolute 0.8 0.1 - 1.0 K/uL   Eosinophils Relative 1 %   Eosinophils Absolute 0.0 0.0 - 0.7 K/uL   Basophils Relative 0 %   Basophils Absolute 0.0 0.0 - 0.1 K/uL   Smear Review MORPHOLOGY UNREMARKABLE   Comprehensive metabolic panel     Status: Abnormal   Collection Time: 11/10/17  3:49 AM  Result Value Ref Range   Sodium 111 (LL) 135 - 145 mmol/L    Comment: REPEATED TO VERIFY CRITICAL RESULT CALLED TO, READ BACK BY AND VERIFIED WITH: OWALABI,A RN 1.16.19 @0501  ZANDO,C    Potassium 3.2 (L) 3.5 - 5.1 mmol/L   Chloride 75 (L) 101 - 111 mmol/L   CO2 24 22 - 32 mmol/L   Glucose, Bld 93 65 - 99  mg/dL   BUN 13 6 - 20 mg/dL   Creatinine, Ser 0.54 0.44 - 1.00 mg/dL   Calcium 7.9 (L) 8.9 - 10.3 mg/dL   Total Protein 5.0 (L) 6.5 - 8.1 g/dL   Albumin 2.7 (L) 3.5 - 5.0 g/dL   AST 34 15 - 41 U/L   ALT 22 14 - 54 U/L   Alkaline Phosphatase 51 38 - 126 U/L   Total Bilirubin 1.7 (H) 0.3 - 1.2 mg/dL   GFR calc non Af Amer >60 >60 mL/min   GFR calc Af Amer >60 >60 mL/min    Comment: (NOTE) The eGFR has been calculated using the CKD EPI equation. This calculation has not been validated in all clinical situations. eGFR's persistently <60 mL/min signify possible Chronic Kidney Disease.    Anion gap 12 5 - 15  Salicylate level     Status: None   Collection Time: 11/10/17  3:49 AM  Result Value Ref Range   Salicylate Lvl <8.6 2.8 - 30.0 mg/dL  Acetaminophen level     Status: Abnormal   Collection Time: 11/10/17  3:49 AM  Result Value Ref Range   Acetaminophen (Tylenol), Serum <10 (L) 10 - 30 ug/mL    Comment:        THERAPEUTIC CONCENTRATIONS VARY SIGNIFICANTLY. A RANGE OF  10-30 ug/mL MAY BE AN EFFECTIVE CONCENTRATION FOR MANY PATIENTS. HOWEVER, SOME ARE BEST TREATED AT CONCENTRATIONS OUTSIDE THIS RANGE. ACETAMINOPHEN CONCENTRATIONS >150 ug/mL AT 4 HOURS AFTER INGESTION AND >50 ug/mL AT 12 HOURS AFTER INGESTION ARE OFTEN ASSOCIATED WITH TOXIC REACTIONS.   Ethanol     Status: None   Collection Time: 11/10/17  3:49 AM  Result Value Ref Range   Alcohol, Ethyl (B) <10 <10 mg/dL    Comment:        LOWEST DETECTABLE LIMIT FOR SERUM ALCOHOL IS 10 mg/dL FOR MEDICAL PURPOSES ONLY   Ammonia     Status: None   Collection Time: 11/10/17  3:49 AM  Result Value Ref Range   Ammonia 15 9 - 35 umol/L  I-Stat Troponin, ED (not at [facility])     Status: Abnormal   Collection Time: 11/10/17  3:56 AM  Result Value Ref Range   Troponin i, poc 0.24 (HH) 0.00 - 0.08 ng/mL   Comment NOTIFIED PHYSICIAN    Comment 3            Comment: Due to the release kinetics of cTnI, a negative result within the first hours of the onset of symptoms does not rule out myocardial infarction with certainty. If myocardial infarction is still suspected, repeat the test at appropriate intervals.   I-Stat CG4 Lactic Acid, ED     Status: None   Collection Time: 11/10/17  3:58 AM  Result Value Ref Range   Lactic Acid, Venous 0.75 0.5 - 1.9 mmol/L  Urinalysis, Routine w reflex microscopic     Status: Abnormal   Collection Time: 11/10/17  4:38 AM  Result Value Ref Range   Color, Urine YELLOW YELLOW   APPearance CLEAR CLEAR   Specific Gravity, Urine 1.011 1.005 - 1.030   pH 6.0 5.0 - 8.0   Glucose, UA 50 (A) NEGATIVE mg/dL   Hgb urine dipstick SMALL (A) NEGATIVE   Bilirubin Urine NEGATIVE NEGATIVE   Ketones, ur 5 (A) NEGATIVE mg/dL   Protein, ur NEGATIVE NEGATIVE mg/dL   Nitrite NEGATIVE NEGATIVE   Leukocytes, UA NEGATIVE NEGATIVE   RBC / HPF NONE SEEN 0 - 5 RBC/hpf  WBC, UA 0-5 0 - 5 WBC/hpf   Bacteria, UA RARE (A) NONE SEEN   Squamous Epithelial / LPF NONE SEEN NONE SEEN   Mucus  PRESENT   Rapid urine drug screen (hospital performed)     Status: Abnormal   Collection Time: 11/10/17  4:38 AM  Result Value Ref Range   Opiates POSITIVE (A) NONE DETECTED   Cocaine NONE DETECTED NONE DETECTED   Benzodiazepines NONE DETECTED NONE DETECTED   Amphetamines NONE DETECTED NONE DETECTED   Tetrahydrocannabinol NONE DETECTED NONE DETECTED   Barbiturates NONE DETECTED NONE DETECTED    Comment: (NOTE) DRUG SCREEN FOR MEDICAL PURPOSES ONLY.  IF CONFIRMATION IS NEEDED FOR ANY PURPOSE, NOTIFY LAB WITHIN 5 DAYS. LOWEST DETECTABLE LIMITS FOR URINE DRUG SCREEN Drug Class                     Cutoff (ng/mL) Amphetamine and metabolites    1000 Barbiturate and metabolites    200 Benzodiazepine                 295 Tricyclics and metabolites     300 Opiates and metabolites        300 Cocaine and metabolites        300 THC                            50   Sodium, urine, random     Status: None   Collection Time: 11/10/17  4:38 AM  Result Value Ref Range   Sodium, Ur 34 mmol/L    Comment: Performed at [facility] [address], [place], [place] [address]  I-Stat CG4 Lactic Acid, ED     Status: None   Collection Time: 11/10/17  6:37 AM  Result Value Ref Range   Lactic Acid, Venous 0.80 0.5 - 1.9 mmol/L  Troponin I     Status: Abnormal   Collection Time: 11/10/17  8:11 AM  Result Value Ref Range   Troponin I 0.26 (HH) <0.03 ng/mL    Comment: CRITICAL RESULT CALLED TO, READ BACK BY AND VERIFIED WITH: P.DOWD RN [phone] [id number] A.QUIZON   Basic metabolic panel     Status: Abnormal   Collection Time: 11/10/17  8:11 AM  Result Value Ref Range   Sodium 112 (LL) 135 - 145 mmol/L    Comment: CRITICAL RESULT CALLED TO, READ BACK BY AND VERIFIED WITH: P.DOWD RN [phone] [id number] A.QUIZON    Potassium 2.8 (L) 3.5 - 5.1 mmol/L   Chloride 79 (L) 101 - 111 mmol/L   CO2 24 22 - 32 mmol/L   Glucose, Bld 75 65 - 99 mg/dL   BUN 11 6 - 20 mg/dL   Creatinine, Ser 0.47 0.44 - 1.00 mg/dL    Calcium 7.4 (L) 8.9 - 10.3 mg/dL   GFR calc non Af Amer >60 >60 mL/min   GFR calc Af Amer >60 >60 mL/min    Comment: (NOTE) The eGFR has been calculated using the CKD EPI equation. This calculation has not been validated in all clinical situations. eGFR's persistently <60 mL/min signify possible Chronic Kidney Disease.    Anion gap 9 5 - 15  Troponin I     Status: Abnormal   Collection Time: 11/10/17 12:56 PM  Result Value Ref Range   Troponin I 0.17 (HH) <0.03 ng/mL    Comment: CRITICAL VALUE NOTED.  VALUE IS CONSISTENT WITH PREVIOUSLY REPORTED AND CALLED VALUE.  Basic metabolic panel     Status: Abnormal   Collection Time: 11/10/17 12:56 PM  Result Value Ref Range   Sodium 115 (LL) 135 - 145 mmol/L    Comment: CRITICAL RESULT CALLED TO, READ BACK BY AND VERIFIED WITH: P.DOWD RN [id number] [id number] A.QUIZON    Potassium 3.6 3.5 - 5.1 mmol/L    Comment: DELTA CHECK NOTED NO VISIBLE HEMOLYSIS    Chloride 84 (L) 101 - 111 mmol/L   CO2 21 (L) 22 - 32 mmol/L   Glucose, Bld 70 65 - 99 mg/dL   BUN 11 6 - 20 mg/dL   Creatinine, Ser 0.52 0.44 - 1.00 mg/dL   Calcium 7.2 (L) 8.9 - 10.3 mg/dL   GFR calc non Af Amer >60 >60 mL/min   GFR calc Af Amer >60 >60 mL/min    Comment: (NOTE) The eGFR has been calculated using the CKD EPI equation. This calculation has not been validated in all clinical situations. eGFR's persistently <60 mL/min signify possible Chronic Kidney Disease.    Anion gap 10 5 - 15   Dg Chest 2 View  Result Date: 11/10/2017 CLINICAL DATA:  67 year old female with altered mental status. EXAM: CHEST  2 VIEW COMPARISON:  None. FINDINGS: Left IJ central venous line with tip over the spine just below the diaphragm. There is mild emphysematous changes of the lungs. No focal consolidation, pleural effusion, or pneumothorax. The cardiac silhouette is within normal limits. No acute osseous pathology. IMPRESSION: No active cardiopulmonary disease. Electronically Signed   By: Anner Crete M.D.   On: 11/10/2017 04:33   Ct Head Wo Contrast  Result Date: 11/10/2017 CLINICAL DATA:  67 y/o F; visual hallucinations, agitation, increasing paranoia, recent diagnosis of delirium. EXAM: CT HEAD WITHOUT CONTRAST TECHNIQUE: Contiguous axial images were obtained from the base of the skull through the vertex without intravenous contrast. COMPARISON:  12/26/2013 CT head FINDINGS: Brain: No evidence of acute infarction, hemorrhage, hydrocephalus, extra-axial collection or mass lesion/mass effect. Left parietal approach ventriculostomy catheter with tip and right lateral ventricle near foramen of Monro, stable. Stable ventricle size. Stable chronic infarct of corpus callosum. Interval progression of moderate chronic microvascular ischemic changes and mild parenchymal volume loss of the brain. Vascular: No hyperdense vessel or unexpected calcification. Skull: Normal. Negative for fracture or focal lesion. Sinuses/Orbits: Small fluid levels in the maxillary sinuses. Otherwise negative. Other: None. IMPRESSION: 1. No acute intracranial abnormality identified. 2. Progression of moderate chronic microvascular ischemic changes. Stable chronic infarct of corpus callosum. 3. Stable position of left parietal approach ventriculostomy catheter. Stable ventricle size. 4. Fluid levels in maxillary sinus may represent acute sinusitis. Electronically Signed   By: Kristine Garbe M.D.   On: 11/10/2017 04:52    Pending Labs Unresulted Labs (From admission, onward)   Start     Ordered   11/11/17 0500  CBC  Tomorrow morning,   R     11/10/17 0634   11/11/17 0500  Magnesium  Tomorrow morning,   R     11/10/17 0634   11/11/17 0500  Phosphorus  Tomorrow morning,   R     11/10/17 0634   11/10/17 0454  Basic metabolic panel  Now then every 4 hours,   R     11/10/17 0645      Vitals/Pain Today's Vitals   11/10/17 1245 11/10/17 1400 11/10/17 1430 11/10/17 1445  BP: 131/61 113/61 (!) 118/57 (!) 120/58   Pulse: 82 73 77 81  Resp: 20 (!) 22 (!) 24 (!)  24  Temp:      TempSrc:      SpO2: 95% 95% 94% 94%  Weight:      Height:        Isolation Precautions No active isolations  Medications Medications  0.9 %  sodium chloride infusion ( Intravenous New Bag/Given 11/10/17 1032)  heparin injection 5,000 Units (5,000 Units Subcutaneous Given 11/10/17 0825)  0.9 %  sodium chloride infusion (not administered)  sodium chloride 0.9 % bolus 1,000 mL (0 mLs Intravenous Stopped 11/10/17 0622)  potassium chloride 10 mEq in 100 mL IVPB (0 mEq Intravenous Stopped 11/10/17 1301)    Mobility walks

## 2017-11-10 NOTE — H&P (Addendum)
.. ..  Name: Angela Duncan MRN: [id number] DOB: Sep 04, 1951    ADMISSION DATE:  11/10/2017 CONSULTATION DATE:  11/10/2017  REFERRING MD :  Marlon Pel PA- EDP  CHIEF COMPLAINT:  Altered mental Status- Hallucinations  BRIEF PATIENT DESCRIPTION: 67 yr old female with PMHx significant for HTN, s/p ventriculostomy catheter, h/o oral ulcers,  Anxiety and Asthma presents with visual hallucinations, agitation and paranoia found to have Na+ 111  SIGNIFICANT EVENTS  Decreased responsiveness, AMS  STUDIES:  CTH w/o contrast UDS    HISTORY OF PRESENT ILLNESS:-year-old female with past medical history of hypertension, status post ventriculostomy catheter, history of oral ulcers, anxiety, and asthma presenting with delirium, visual hallucinations, agitation, and paranoia.  Per ER PA documentation patient received recent diagnosis of delirium as an outpatient EMS was called by the patient's family due to change in mental status she received 5 mg of Haldol in route to the ED. EKG shows QTC at 504. initially hypotensive on presentation.  Pt has a h/o non compliance due to difficulty affording her medicine. HPI obtained from chart and other care providers  PAST MEDICAL HISTORY :   has a past medical history of Anxiety, Asthma (1993), Depression, Hydrocephalus (2010), Hypertension, Rocky Mountain spotted fever (2010), and Stress-induced cardiomyopathy (6/11).  has a past surgical history that includes Shunt placement in head (2010); partial hysterectomy - due to bleeding issues; and cholecystectomy : stones. Prior to Admission medications   Medication Sig Start Date End Date Taking? Authorizing Provider  albuterol (PROVENTIL HFA;VENTOLIN HFA) 108 (90 BASE) MCG/ACT inhaler Inhale 2 puffs into the lungs every 6 (six) hours as needed for wheezing or shortness of breath.     [provider]  amLODipine (NORVASC) 10 MG tablet TAKE ONE TABLET BY MOUTH DAILY 04/30/15   Fayrene Helper, MD  aspirin  (ASPIRIN LOW DOSE) 81 MG EC tablet Take 81 mg by mouth daily.      [provider]  budesonide-formoterol (SYMBICORT) 160-4.5 MCG/ACT inhaler Inhale 2 puffs into the lungs 2 (two) times daily.    [provider]  docusate sodium (COLACE) 100 MG capsule Take 200 mg by mouth 2 (two) times daily.      [provider]  hydrochlorothiazide (HYDRODIURIL) 25 MG tablet Take 1 tablet (25 mg total) by mouth daily. 01/01/14   Fayrene Helper, MD  lisinopril (PRINIVIL,ZESTRIL) 40 MG tablet TAKE ONE TABLET BY MOUTH EVERY DAY 04/30/15   Fayrene Helper, MD  lovastatin (MEVACOR) 40 MG tablet TAKE ONE TABLET BY MOUTH AT BEDTIME *D/C CRESTOR* 12/25/14   Fayrene Helper, MD  metoprolol tartrate (LOPRESSOR) 25 MG tablet Take 1 tablet (25 mg total) by mouth 2 (two) times daily. 01/14/15   Fayrene Helper, MD  mirtazapine (REMERON) 15 MG tablet TAKE 1/2 TABLET BY MOUTH EVERY DAY AT BEDTIME 03/26/15   Fayrene Helper, MD  nitroGLYCERIN (NITROSTAT) 0.4 MG SL tablet Place 0.4 mg under the tongue as directed.      [provider]   Allergies  Allergen Reactions  . Penicillins     Made her sleep all day    FAMILY HISTORY:  family history includes Diabetes in her mother; Heart disease in her brother; Hyperlipidemia in her sister; Hypertension in her father, mother, and sister; Kidney failure in her mother; Throat cancer in her sister; Thyroid cancer in her mother; Thyroid disease in her sister. SOCIAL HISTORY:  reports that  has never smoked. She does not have any smokeless tobacco history on  file. She reports that she drinks alcohol. She reports that she does not use drugs.  REVIEW OF SYSTEMS:  Unable to obtain from patient dur to metabolic encephalopathy  Constitutional: Negative for fever, chills, weight loss, malaise/fatigue and diaphoresis.  HENT: Negative for hearing loss, ear pain, nosebleeds, congestion, sore throat, neck pain, tinnitus and ear discharge.   Eyes:  Negative for blurred vision, double vision, photophobia, pain, discharge and redness.  Respiratory: Negative for cough, hemoptysis, sputum production, shortness of breath, wheezing and stridor.   Cardiovascular: Negative for chest pain, palpitations, orthopnea, claudication, leg swelling and PND.  Gastrointestinal: Negative for heartburn, nausea, vomiting, abdominal pain, diarrhea, constipation, blood in stool and melena.  Genitourinary: Negative for dysuria, urgency, frequency, hematuria and flank pain.  Musculoskeletal: Negative for myalgias, back pain, joint pain and falls.  Skin: Negative for itching and rash.  Neurological: Negative for dizziness, tingling, tremors, sensory change, speech change, focal weakness, seizures, loss of consciousness, weakness and headaches.  Endo/Heme/Allergies: Negative for environmental allergies and polydipsia. Does not bruise/bleed easily.  SUBJECTIVE:   VITAL SIGNS: Temp:  [97 F (36.1 C)-97.4 F (36.3 C)] 97 F (36.1 C) (01/16 0438) Pulse Rate:  [57-60] 60 (01/16 0510) Resp:  [16-18] 16 (01/16 0510) BP: (82-108)/(46-60) 108/60 (01/16 0510) SpO2:  [95 %-96 %] 95 % (01/16 0510)  PHYSICAL EXAMINATION: General:  In NAD Neuro:  Not very expressive, does respond to verbal stimuli, makes eye contact PERRLA, EOM intact, slight tremor in right hand at rest.not following commands HEENT:  Normocephalic atraumatic Cardiovascular:  S1 and S2 appreciated Lungs:  Diminished at bases otherwise good air entry Abdomen:  Soft,NT, ND + BS Musculoskeletal:  No gross deformities Skin:  Grossly intact  Recent Labs  Lab 11/10/17 0349  NA 111*  K 3.2*  CL 75*  CO2 24  BUN 13  CREATININE 0.54  GLUCOSE 93   Recent Labs  Lab 11/10/17 0349  HGB 11.8*  HCT 31.3*  WBC 7.4  PLT 326   Dg Chest 2 View  Result Date: 11/10/2017 CLINICAL DATA:  66 year old female with altered mental status. EXAM: CHEST  2 VIEW COMPARISON:  None. FINDINGS: Left IJ central venous  line with tip over the spine just below the diaphragm. There is mild emphysematous changes of the lungs. No focal consolidation, pleural effusion, or pneumothorax. The cardiac silhouette is within normal limits. No acute osseous pathology. IMPRESSION: No active cardiopulmonary disease. Electronically Signed   By: Anner Crete M.D.   On: 11/10/2017 04:33   Ct Head Wo Contrast  Result Date: 11/10/2017 CLINICAL DATA:  67 y/o F; visual hallucinations, agitation, increasing paranoia, recent diagnosis of delirium. EXAM: CT HEAD WITHOUT CONTRAST TECHNIQUE: Contiguous axial images were obtained from the base of the skull through the vertex without intravenous contrast. COMPARISON:  12/26/2013 CT head FINDINGS: Brain: No evidence of acute infarction, hemorrhage, hydrocephalus, extra-axial collection or mass lesion/mass effect. Left parietal approach ventriculostomy catheter with tip and right lateral ventricle near foramen of Monro, stable. Stable ventricle size. Stable chronic infarct of corpus callosum. Interval progression of moderate chronic microvascular ischemic changes and mild parenchymal volume loss of the brain. Vascular: No hyperdense vessel or unexpected calcification. Skull: Normal. Negative for fracture or focal lesion. Sinuses/Orbits: Small fluid levels in the maxillary sinuses. Otherwise negative. Other: None. IMPRESSION: 1. No acute intracranial abnormality identified. 2. Progression of moderate chronic microvascular ischemic changes. Stable chronic infarct of corpus callosum. 3. Stable position of left parietal approach ventriculostomy catheter. Stable ventricle size. 4. Fluid levels  in maxillary sinus may represent acute sinusitis. Electronically Signed   By: Kristine Garbe M.D.   On: 11/10/2017 04:52    ASSESSMENT / PLAN: NEURO: Altered Mental Status secondary to Metabolic encephalopathy from Symptomatic Hyponatremia Na+ <120 Correction with Normal saline 339 ml/hr-> correct Na +  by 0.5 mEq/hr Correction not to exceed 6-8 mEq/hr. GCS 9-10  UDS positive for opiates  Reviewed results of UDS: positive for opiated Seizure precautions and Aspiration precautions CTH/MRI  CARDIAC: H/o HTN on the following medications: History of taking Hydrodiuril> stopped medication     PULMONARY: Maintain Sats 96% on RA in NAD Aspiration precautions HOB >30 deg   ID: LA<1 No signs of an active infection No need for empiric abx at this time If WBC elevates, or if pt spikes a fever  Then will consider antibiotics  Endocrine: TSH 2.605 No history of DM  GI: NPO  Heme: Hemoglobin at 11 Platelets at 326 If Hgb<7 transfuse PRBCs No signs of active bleeding No  h/o coagulopathy DVT PPx-> if not why is it not given  RENAL Symptomatic Hyponatremia Baseline Cr 0.5 Insert indwelling foley catheter to manage UOP Check urine lytes and osm Correction with Normal saline 339 ml/hr-> correct Na + by 0.5 mEq/hr Correction not to exceed 6-8 mEq within first 24hrs Lab Results  Component Value Date   CREATININE 0.54 11/10/2017   CREATININE 1.05 11/30/2014   CREATININE 1.04 07/23/2014      I, Dr Seward Carol have personally reviewed patient's available data, including medical history, events of note, physical examination and test results as part of my evaluation. I have discussed with other care providers such as pharmacist, RN, PA and E link The patient is critically ill with multiple organ systems failure and requires high complexity decision making for assessment and support, frequent evaluation and titration of therapies, application of advanced monitoring technologies and extensive interpretation of multiple databases. Critical Care Time devoted to patient care services described in this note is 45 Minutes. This time reflects time of care of this signee Dr Seward Carol. This critical care time does not reflect procedure time, or teaching time or supervisory time  but could involve care discussion time    DISPOSITION: ICU   CC TIME:45 MINS PROGNOSIS: GUARDED CODE STATUS: FULL FAMILY:   Signed Dr Seward Carol Pulmonary Critical Care Locums   11/10/2017, 6:04 AM

## 2017-11-10 NOTE — Progress Notes (Signed)
eLink Physician-Brief Progress Note Patient Name: Angela Duncan DOB: [birth year] MRN: [id number]   Date of Service  11/10/2017  HPI/Events of Note  Na up to 118.  Na infusion ordered earlier now complete.  Patient alert and talking.  Hypokalemia  eICU Interventions  Reduced NS infusion to 30 cc/hr since patient almost to goal Na of 119. Potassium replaced     Intervention Category Major Interventions: Electrolyte abnormality - evaluation and management  DETERDING,ELIZABETH 11/10/2017, 8:27 PM

## 2017-11-10 NOTE — ED Notes (Signed)
Angela Duncan [phone] and Angela Duncan [phone] would like to be updated on patient status changes.

## 2017-11-11 ENCOUNTER — Inpatient Hospital Stay (HOSPITAL_COMMUNITY): Payer: Medicare HMO

## 2017-11-11 LAB — CBC
HEMATOCRIT: 26.3 % — AB (ref 36.0–46.0)
HEMOGLOBIN: 9.7 g/dL — AB (ref 12.0–15.0)
MCH: 35.3 pg — ABNORMAL HIGH (ref 26.0–34.0)
MCHC: 36.9 g/dL — AB (ref 30.0–36.0)
MCV: 95.6 fL (ref 78.0–100.0)
Platelets: 337 10*3/uL (ref 150–400)
RBC: 2.75 MIL/uL — ABNORMAL LOW (ref 3.87–5.11)
RDW: 12.7 % (ref 11.5–15.5)
WBC: 4.5 10*3/uL (ref 4.0–10.5)

## 2017-11-11 LAB — BASIC METABOLIC PANEL
ANION GAP: 10 (ref 5–15)
ANION GAP: 7 (ref 5–15)
Anion gap: 8 (ref 5–15)
Anion gap: 11 (ref 5–15)
BUN: 10 mg/dL (ref 6–20)
BUN: 10 mg/dL (ref 6–20)
BUN: 8 mg/dL (ref 6–20)
BUN: 9 mg/dL (ref 6–20)
CALCIUM: 7.5 mg/dL — AB (ref 8.9–10.3)
CALCIUM: 7.7 mg/dL — AB (ref 8.9–10.3)
CHLORIDE: 92 mmol/L — AB (ref 101–111)
CHLORIDE: 100 mmol/L — AB (ref 101–111)
CHLORIDE: 99 mmol/L — AB (ref 101–111)
CO2: 18 mmol/L — ABNORMAL LOW (ref 22–32)
CO2: 19 mmol/L — AB (ref 22–32)
CO2: 21 mmol/L — ABNORMAL LOW (ref 22–32)
CO2: 24 mmol/L (ref 22–32)
CREATININE: 0.51 mg/dL (ref 0.44–1.00)
CREATININE: 0.54 mg/dL (ref 0.44–1.00)
CREATININE: 0.53 mg/dL (ref 0.44–1.00)
Calcium: 8.5 mg/dL — ABNORMAL LOW (ref 8.9–10.3)
Calcium: 8.1 mg/dL — ABNORMAL LOW (ref 8.9–10.3)
Chloride: 96 mmol/L — ABNORMAL LOW (ref 101–111)
Creatinine, Ser: 0.53 mg/dL (ref 0.44–1.00)
GFR calc non Af Amer: >60 mL/min (ref >60)
GFR calc non Af Amer: >60 mL/min (ref >60)
GFR calc non Af Amer: >60 mL/min (ref >60)
GFR calc non Af Amer: >60 mL/min (ref >60)
Glucose, Bld: 147 mg/dL — ABNORMAL HIGH (ref 65–99)
Glucose, Bld: 65 mg/dL (ref 65–99)
Glucose, Bld: 92 mg/dL (ref 65–99)
Glucose, Bld: 97 mg/dL (ref 65–99)
Potassium: 3.6 mmol/L (ref 3.5–5.1)
Potassium: 4 mmol/L (ref 3.5–5.1)
Potassium: 4.2 mmol/L (ref 3.5–5.1)
Potassium: 4.4 mmol/L (ref 3.5–5.1)
SODIUM: 120 mmol/L — AB (ref 135–145)
SODIUM: 124 mmol/L — AB (ref 135–145)
Sodium: 132 mmol/L — ABNORMAL LOW (ref 135–145)
Sodium: 129 mmol/L — ABNORMAL LOW (ref 135–145)

## 2017-11-11 LAB — GLUCOSE, CAPILLARY
GLUCOSE-CAPILLARY: 71 mg/dL (ref 65–99)
Glucose-Capillary: 124 mg/dL — ABNORMAL HIGH (ref 65–99)

## 2017-11-11 LAB — MAGNESIUM: Magnesium: 1.5 mg/dL — ABNORMAL LOW (ref 1.7–2.4)

## 2017-11-11 LAB — PHOSPHORUS: PHOSPHORUS: 2.3 mg/dL — AB (ref 2.5–4.6)

## 2017-11-11 MED ORDER — SODIUM CHLORIDE 0.45 % IV SOLN
INTRAVENOUS | Status: DC
Start: 2017-11-11 — End: 2017-11-12
  Administered 2017-11-11: 11:00:00 via INTRAVENOUS

## 2017-11-11 MED ORDER — IPRATROPIUM-ALBUTEROL 0.5-2.5 (3) MG/3ML IN SOLN
3.0000 mL | Freq: Four times a day (QID) | RESPIRATORY_TRACT | Status: DC
  Administered 2017-11-11 – 2017-11-12 (×4): 3 mL via RESPIRATORY_TRACT
  Filled 2017-11-11 (×4): qty 3

## 2017-11-11 MED ORDER — ALBUTEROL SULFATE (2.5 MG/3ML) 0.083% IN NEBU: 2.5000 mg | INHALATION_SOLUTION | Freq: Four times a day (QID) | RESPIRATORY_TRACT | Status: DC | PRN

## 2017-11-11 MED ORDER — POTASSIUM PHOSPHATES 15 MMOLE/5ML IV SOLN
20.0000 meq | Freq: Once | INTRAVENOUS | Status: AC
  Administered 2017-11-11: 20 meq via INTRAVENOUS
  Filled 2017-11-11: qty 4.55

## 2017-11-11 MED ORDER — MAGNESIUM SULFATE 2 GM/50ML IV SOLN
2.0000 g | Freq: Once | INTRAVENOUS | Status: AC
  Administered 2017-11-11: 2 g via INTRAVENOUS
  Filled 2017-11-11: qty 50

## 2017-11-11 NOTE — Progress Notes (Signed)
PULMONARY / CRITICAL CARE MEDICINE   Name: Angela Duncan MRN: [id number] DOB: [birth year]    ADMISSION DATE:  11/10/2017 CONSULTATION DATE: 11/10/2017  REFERRING MD:  Marlon Pel PA  CHIEF COMPLAINT:  Altered Mental Status - Hallucinations  BRIEF SUMMARY: 67 year old female who presented to the ED on 1/16 with AMS and hallucinations. Per report, there were recent concerns for delirium. PMHx is significant for HTN on multiple prescriptions including HCTZ, anxiety and depression, hydrocephalus, and asthma.   She was found to be agitated and paranoid in the ED with a BP of 82/46. Initial labs showed hyponatremia of 111, hypokalemia of 3.2 and chloride of 75 and calcium of 7.9. Initial troponin was elevated at 0.24, hgb/hct of 11.8 and 31.3 respectively. UDS was positive for opiates and urine osmolality was low at 34. The patient reportedly lives alone.    PCCM was consulted and patient was admitted.     SUBJECTIVE:  Pt reports feeling better.  Na increased to 124 this am.  No acute events overnight.  Progressively improved mental status.   VITAL SIGNS: BP (!) 142/117 (BP Location: Right Arm)   Pulse 94   Temp 98.1 F (36.7 C) (Axillary)   Resp (!) 21   Ht 5\' 2"  (1.575 m)   Wt 129 lb 3 oz (58.6 kg)   SpO2 90%   BMI 23.63 kg/m   HEMODYNAMICS:    VENTILATOR SETTINGS:    INTAKE / OUTPUT: I/O last 3 completed shifts: In: 1440.5 [P.O.:340; I.V.:100.5; IV Piggyback:1000] Out: 1400 [Urine:1400]  PHYSICAL EXAMINATION: General: chronically ill appearing female, sleeping in bed in no acute distress, appears older than stated age Neuro: Alert and oriented, follows commands, VP shunt palpable on L posterior scalp HEENT: Normocephalic, atraumatic, edentulous  Cardiovascular: RRR without murmurs, 1+ edema present in bilateral LE Lungs: normal respiratory effort, without accessory muscle use, faint wheeze bilaterally Abdomen: Non distended, soft Skin: warm, dry  LABS:  BMET Recent  Labs  Lab 11/10/17 1928 11/10/17 2343 11/11/17 0344  NA 118* 120* 124*  K 3.2* 3.6 4.0  CL 90* 92* 96*  CO2 17* 18* 21*  BUN 10 10 10   CREATININE 0.59 0.51 0.53  GLUCOSE 64* 65 97    Electrolytes Recent Labs  Lab 11/10/17 1928 11/10/17 2343 11/11/17 0344  CALCIUM 7.2* 7.5* 7.7*  MG  --   --  1.5*  PHOS  --   --  2.3*    CBC Recent Labs  Lab 11/10/17 0349 11/11/17 0440  WBC 7.4 4.5  HGB 11.8* 9.7*  HCT 31.3* 26.3*  PLT 326 337    Coag's No results for input(s): APTT, INR in the last 168 hours.  Sepsis Markers Recent Labs  Lab 11/10/17 0358 11/10/17 0637  LATICACIDVEN 0.75 0.80    ABG No results for input(s): PHART, PCO2ART, PO2ART in the last 168 hours.  Liver Enzymes Recent Labs  Lab 11/10/17 0349  AST 34  ALT 22  ALKPHOS 51  BILITOT 1.7*  ALBUMIN 2.7*    Cardiac Enzymes Recent Labs  Lab 11/10/17 0811 11/10/17 1256  TROPONINI 0.26* 0.17*    Glucose Recent Labs  Lab 11/11/17 0048  GLUCAP 71    Imaging No results found.   STUDIES:  U. Na 1/16 >> 34  CXR 1/16 >> Mild emphysematous changes to the lungs CT Head 1/16 >> moderate, chronic microvascular ischemic changes, acute sinusitis EKG 1/16 >> prolonged QT, minimal ST depression in the inferior leads  CULTURES: MRSA PCR 1/16 >>  ANTIBIOTICS: none  SIGNIFICANT EVENTS: 1/16  Admitted to ICU  LINES/TUBES:   DISCUSSION: 67 year old female who presented to the ED in an altered state with hallucinations and agitation. She is reportedly on multiple antihypertensive medications including HCTZ and was found to be severely hyponatremic. Based on her history of medications, overall benign medical history and other low electrolytes, patient has a working diagnosis of symptomatic hyponatremia due to HCTZ. She has improved and is stable.  ASSESSMENT / PLAN:  RENAL A:   Symptomatic Hyponatremia - improved Hypotonic Hyponatremia P:   Trend BMP Q8 x4  Change IVF to 1/2 NS @ 30  cc/hr Replace electrolytes as needed  CARDIOVASCULAR A:  Hypertension P:  Trend BP, currently stable  Continue amlodipine and metoprolol Continue to hold ACE-I and HCTZ  Likely she should not go back on HCTZ  PULMONARY A: Asthma Remote Tobacco Abuse P:   Duoneb Q6 with PRN albuterol  Hold home Breo / Incruse   HEMATOLOGIC A:   Anemia P:  Trend CBC   NEUROLOGIC A: Acute Metabolic Encephalopathy - in setting of hyponatremia  P: Correct Na slowly Neuro checks Seizure precautions  Supportive care   PSYCHIATRY A:  Depression Anxiety P: Continue mirtazipine  FAMILY  - Updates: None at bedside.  Patient updated on plan of care  Kathleene Hazel PA-S   To SDU, [place] for am 1/18.  Noe Gens, NP-C [facility] Pgr: [phone] or if no answer [phone] 11/11/2017, 2:15 PM

## 2017-11-12 DIAGNOSIS — L8990 Pressure ulcer of unspecified site, unspecified stage: Secondary | ICD-10-CM

## 2017-11-12 LAB — CBC
HCT: 33.1 % — ABNORMAL LOW (ref 36.0–46.0)
Hemoglobin: 12 g/dL (ref 12.0–15.0)
MCH: 35.2 pg — AB (ref 26.0–34.0)
MCHC: 36.3 g/dL — ABNORMAL HIGH (ref 30.0–36.0)
MCV: 97.1 fL (ref 78.0–100.0)
PLATELETS: 530 10*3/uL — AB (ref 150–400)
RBC: 3.41 MIL/uL — ABNORMAL LOW (ref 3.87–5.11)
RDW: 13.1 % (ref 11.5–15.5)
WBC: 10.6 10*3/uL — ABNORMAL HIGH (ref 4.0–10.5)

## 2017-11-12 LAB — BASIC METABOLIC PANEL
Anion gap: 7 (ref 5–15)
BUN: 9 mg/dL (ref 6–20)
CO2: 23 mmol/L (ref 22–32)
Calcium: 8.2 mg/dL — ABNORMAL LOW (ref 8.9–10.3)
Chloride: 100 mmol/L — ABNORMAL LOW (ref 101–111)
Creatinine, Ser: 0.47 mg/dL (ref 0.44–1.00)
GFR calc Af Amer: >60 mL/min (ref >60)
GLUCOSE: 145 mg/dL — AB (ref 65–99)
Potassium: 4.2 mmol/L (ref 3.5–5.1)
SODIUM: 130 mmol/L — AB (ref 135–145)

## 2017-11-12 LAB — GLUCOSE, CAPILLARY: GLUCOSE-CAPILLARY: 163 mg/dL — AB (ref 65–99)

## 2017-11-12 MED ORDER — SODIUM CHLORIDE 0.9 % IV SOLN
INTRAVENOUS | Status: DC
  Administered 2017-11-12 – 2017-11-15 (×3): via INTRAVENOUS

## 2017-11-12 MED ORDER — IPRATROPIUM-ALBUTEROL 0.5-2.5 (3) MG/3ML IN SOLN: 3.0000 mL | Freq: Four times a day (QID) | RESPIRATORY_TRACT | Status: DC | PRN

## 2017-11-12 NOTE — Progress Notes (Signed)
PROGRESS NOTE    Angela Duncan  YNW:[id number] DOB: [birth year] DOA: 11/10/2017 PCP: Fayrene Helper, MD  Brief Narrative 67-year-old female admitted with severe hyponatremia on 11/10/2017.  Patient was admitted to the ICU under P CCM transfer to [facility] service 11/12/2017.  Review of the records show that patient's family called 911 due to change in mental status.  She received a dose of Haldol en route ED.  Patient lives at home with this with her family.  Patient in addition to that has a history of hypertension, history of VP shunt for hydrocephalus, asthma, anxiety, delirium, agitation, and paranoia and visual hallucinations.  Patient has not followed up with a neurosurgeon as she does not have a way to get to that appointment or has no one to take her.  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  The shunt was placed in 2010. Assessment & Plan:   Active Problems:   Hyponatremia   Pressure injury of skin  1] severe symptomatic hyponatremia with sodium of 111 upon admission-today her sodium has come up to 130.  Because of the hyponatremia is thought to be secondary to hydrochlorothiazide.  I have added hydrochlorothiazide as an allergic list.  2] hypertension continue metoprolol, amlodipine.  3] mild protein calorie malnutrition with an albumin of 2.7 nutrition consult..  4] multiple electrolyte abnormalities including hypomagnesemia and hypophosphatemia question related to nutritional intake.  Recheck levels tomorrow.  5] severe deconditioning will get PT OT evaluation.    DVT prophylaxis: Subcu heparin Code Status: Full code Family Communication: No family available Disposition Plan: TBD Consultants:   P CCM  Procedures: None Antimicrobials:  None Subjective: Feels better , answer of all the questions appropriately.   Objective:.  In no acute distress. Vitals:     11/12/17 0700 11/12/17 0800 11/12/17 0937 11/12/17 1046  BP: (!) 161/61 (!) 176/72  (!) 143/53  Pulse: 83 85  95  Resp: (!) 27 20    Temp:  98.4 F (36.9 C)    TempSrc:  Oral    SpO2: 95% 94% 94%   Weight:      Height:        Intake/Output Summary (Last 24 hours) at 11/12/2017 1226 Last data filed at 11/12/2017 0800 Gross per 24 hour  Intake 1440 ml  Output -  Net 1440 ml   Filed Weights   11/10/17 1856 11/11/17 0450 11/12/17 0500  Weight: 58.6 kg (129 lb 3 oz) 58.6 kg (129 lb 3 oz) 60 kg (132 lb 4.4 oz)    Examination:  General exam: Appears calm and comfortable  Respiratory system: Clear to auscultation. Respiratory effort normal. Cardiovascular system: S1 & S2 heard, RRR. No JVD, murmurs, rubs, gallops or clicks. No pedal edema. Gastrointestinal system: Abdomen is nondistended, soft and nontender. No organomegaly or masses felt. Normal bowel sounds heard. Central nervous system: Alert and oriented. No focal neurological deficits. Extremities: Symmetric 5 x 5 power. Skin: No rashes, lesions or ulcers Psychiatry: Judgement and insight appear normal. Mood & affect appropriate.     Data Reviewed: I have personally reviewed following labs and imaging studies  CBC: Recent Labs  Lab 11/10/17 0349 11/11/17 0440 11/12/17 0315  WBC 7.4 4.5 10.6*  NEUTROABS 6.0  --   --   HGB 11.8* 9.7* 12.0  HCT 31.3* 26.3* 33.1*  MCV 92.3 95.6 97.1  PLT 326 337 657*   Basic Metabolic  Panel: Recent Labs  Lab 11/10/17 2343 11/11/17 0344 11/11/17 1441 11/11/17 2204 11/12/17 0604  NA 120* 124* 132* 129* 130*  K 3.6 4.0 4.4 4.2 4.2  CL 92* 96* 100* 99* 100*  CO2 18* 21* 24 19* 23  GLUCOSE 65 97 92 147* 145*  BUN 10 10 9 8 9   CREATININE 0.51 0.53 0.54 0.53 0.47  CALCIUM 7.5* 7.7* 8.5* 8.1* 8.2*  MG  --  1.5*  --   --   --   PHOS  --  2.3*  --   --   --    GFR: Estimated Creatinine Clearance: 54.7 mL/min (by C-G formula based on SCr of 0.47 mg/dL). Liver Function  Tests: Recent Labs  Lab 11/10/17 0349  AST 34  ALT 22  ALKPHOS 51  BILITOT 1.7*  PROT 5.0*  ALBUMIN 2.7*   No results for input(s): LIPASE, AMYLASE in the last 168 hours. Recent Labs  Lab 11/10/17 0349  AMMONIA 15   Coagulation Profile: No results for input(s): INR, PROTIME in the last 168 hours. Cardiac Enzymes: Recent Labs  Lab 11/10/17 0811 11/10/17 1256  TROPONINI 0.26* 0.17*   BNP (last 3 results) No results for input(s): PROBNP in the last 8760 hours. HbA1C: No results for input(s): HGBA1C in the last 72 hours. CBG: Recent Labs  Lab 11/11/17 0048 11/11/17 1953 11/12/17 0236  GLUCAP 71 124* 163*   Lipid Profile: No results for input(s): CHOL, HDL, LDLCALC, TRIG, CHOLHDL, LDLDIRECT in the last 72 hours. Thyroid Function Tests: No results for input(s): TSH, T4TOTAL, FREET4, T3FREE, THYROIDAB in the last 72 hours. Anemia Panel: No results for input(s): VITAMINB12, FOLATE, FERRITIN, TIBC, IRON, RETICCTPCT in the last 72 hours. Sepsis Labs: Recent Labs  Lab 11/10/17 0358 11/10/17 2992  LATICACIDVEN 0.75 0.80    Recent Results (from the past 240 hour(s))  MRSA PCR Screening     Status: None   Collection Time: 11/10/17  6:53 PM  Result Value Ref Range Status   MRSA by PCR NEGATIVE NEGATIVE Final    Comment:        The GeneXpert MRSA Assay (FDA approved for NASAL specimens only), is one component of a comprehensive MRSA colonization surveillance program. It is not intended to diagnose MRSA infection nor to guide or monitor treatment for MRSA infections.          Radiology Studies: Dg Chest Port 1 View  Result Date: 11/11/2017 CLINICAL DATA:  Respiratory failure EXAM: PORTABLE CHEST 1 VIEW COMPARISON:  11/10/2017 FINDINGS: Heart size and vascularity normal. Lungs remain clear without infiltrate or effusion. Shunt tubing overlies the left chest. IMPRESSION: No active disease. Electronically Signed   By: Franchot Gallo M.D.   On: 11/11/2017 09:09         Scheduled Meds: . amLODipine  10 mg Oral Daily  . heparin  5,000 Units Subcutaneous Q8H  . ipratropium-albuterol  3 mL Nebulization Q6H  . metoprolol tartrate  25 mg Oral BID  . mirtazapine  7.5 mg Oral QHS   Continuous Infusions: . sodium chloride 30 mL/hr at 11/12/17 0800     LOS: 2 days       Georgette Shell, MD [facility]  If 7PM-7AM, please contact night-coverage [email or web address] Password [facility] 11/12/2017, 12:26 PM

## 2017-11-12 NOTE — Progress Notes (Signed)
Initial Nutrition Assessment  DOCUMENTATION CODES:   Not applicable  INTERVENTION:  - Continue to encourage PO intakes. - RD will continue to monitor for nutrition-related needs.   NUTRITION DIAGNOSIS:   Inadequate oral intake related to acute illness, lethargy/confusion as evidenced by meal completion < 50%.  GOAL:   Patient will meet greater than or equal to 90% of their needs  MONITOR:   PO intake, Weight trends, Labs, I & O's  REASON FOR ASSESSMENT:   Consult Assessment of nutrition requirement/status  ASSESSMENT:   67 year old female who presented to the ED on 1/16 with AMS and hallucinations. Per report, there were recent concerns for delirium. PMHx is significant for HTN on multiple prescriptions including HCTZ, anxiety and depression, hydrocephalus, and asthma.  BMI indicates normal weight. Pt was confused/AMS on admission but now a/o x4. Diet was advanced yesterday at 4:45 PM but pt denies having anything PO yesterday. She consumed 50% of breakfast this AM (265 kcal, 12 grams of protein) without issue. Suspect appetite and intakes will improve now that pt is a/o x4. No abdominal pain or nausea. No problems chewing or swallowing. Unable to obtain information about PO intakes PTA.   Consult received d/t mild protein-calorie malnutrition with albumin of 2.7. When a patient presents with low albumin, it is likely skewed due to the acute inflammatory response. Unless it is suspected that patient had poor PO intake or malnutrition prior to admission, then RD should not be consulted solely for low albumin. Note that low albumin is no longer used to diagnose malnutrition; [place] uses the new malnutrition guidelines published by the American Society for Parenteral and Enteral Nutrition (A.S.P.E.N.) and the Academy of Nutrition and Dietetics (AND).    No muscle and no fat wasting noted at this time. Per chart review, current weight consistent with weight obtained via scale as  far back as 11/17/13.  Medications reviewed; 2 g IV Mg sulfate x1 run today, 20 mEq IV KPhos x1 run yesterday.  Labs reviewed; CBG: 163 mg/dL today, Na: 130 mmol/L, Cl: 100 mmol/L, Ca: 8.2 mg/dL.  IVF: NS @ 50 mL/hr.    NUTRITION - FOCUSED PHYSICAL EXAM:  Completed/assessed; no fat or muscle wasting noted at this time.  Diet Order:  DIET SOFT Room service appropriate? Yes; Fluid consistency: Thin  EDUCATION NEEDS:   No education needs have been identified at this time  Skin:  Skin Assessment: Skin Integrity Issues: Skin Integrity Issues:: Stage II, Other (Comment) Stage II: sacrum Other: L thigh wound  Last BM:  1/16  Height:   Ht Readings from Last 1 Encounters:  11/10/17 5\' 2"  (1.575 m)    Weight:   Wt Readings from Last 1 Encounters:  11/12/17 132 lb 4.4 oz (60 kg)    Ideal Body Weight:  50 kg  BMI:  Body mass index is 24.19 kg/m.  Estimated Nutritional Needs:   Kcal:  1500-1800 (25-30 kcal/kg)  Protein:  66-78 grams (1.1-1.3 grams/kg)  Fluid:  1.2-1.5 L/day      Jarome Matin, MS, RD, LDN, [facility] Inpatient Clinical Dietitian Pager # [phone] After hours/weekend pager # [phone]

## 2017-11-13 LAB — CBC
HCT: 30.8 % — ABNORMAL LOW (ref 36.0–46.0)
Hemoglobin: 10.8 g/dL — ABNORMAL LOW (ref 12.0–15.0)
MCH: 35 pg — ABNORMAL HIGH (ref 26.0–34.0)
MCHC: 35.1 g/dL (ref 30.0–36.0)
MCV: 99.7 fL (ref 78.0–100.0)
Platelets: 420 10*3/uL — ABNORMAL HIGH (ref 150–400)
RBC: 3.09 MIL/uL — ABNORMAL LOW (ref 3.87–5.11)
RDW: 13.5 % (ref 11.5–15.5)
WBC: 8 10*3/uL (ref 4.0–10.5)

## 2017-11-13 LAB — BASIC METABOLIC PANEL
Anion gap: <3 — ABNORMAL LOW (ref 5–15)
BUN: 8 mg/dL (ref 6–20)
CHLORIDE: 103 mmol/L (ref 101–111)
CO2: 26 mmol/L (ref 22–32)
Calcium: 8.1 mg/dL — ABNORMAL LOW (ref 8.9–10.3)
Creatinine, Ser: 0.51 mg/dL (ref 0.44–1.00)
GFR calc Af Amer: >60 mL/min (ref >60)
GFR calc non Af Amer: >60 mL/min (ref >60)
GLUCOSE: 96 mg/dL (ref 65–99)
POTASSIUM: 4.2 mmol/L (ref 3.5–5.1)
Sodium: 131 mmol/L — ABNORMAL LOW (ref 135–145)

## 2017-11-13 LAB — MAGNESIUM: Magnesium: 1.9 mg/dL (ref 1.7–2.4)

## 2017-11-13 LAB — PHOSPHORUS: Phosphorus: 1.7 mg/dL — ABNORMAL LOW (ref 2.5–4.6)

## 2017-11-13 MED ORDER — SODIUM PHOSPHATES 45 MMOLE/15ML IV SOLN
15.0000 mmol | Freq: Once | INTRAVENOUS | Status: AC
  Administered 2017-11-13: 15 mmol via INTRAVENOUS
  Filled 2017-11-13: qty 5

## 2017-11-13 MED ORDER — MAGNESIUM SULFATE 2 GM/50ML IV SOLN
2.0000 g | Freq: Once | INTRAVENOUS | Status: AC
  Administered 2017-11-13: 2 g via INTRAVENOUS
  Filled 2017-11-13: qty 50

## 2017-11-13 NOTE — Progress Notes (Addendum)
PROGRESS NOTE    Angela Duncan  YQM:[id number] DOB: 1951/07/30 DOA: 11/10/2017 PCP: Fayrene Helper, MD    Brief Narrative:81 67-year-old female admitted with severe hyponatremia on 11/10/2017.  Patient was admitted to the ICU under P CCM transfer to [facility] service 11/12/2017.  Review of the records show that patient's family called 911 due to change in mental status.  She received a dose of Haldol en route ED.  Patient lives at home with this with her family.  Patient in addition to that has a history of hypertension, history of VP shunt for hydrocephalus, asthma, anxiety, delirium, agitation, and paranoia and visual hallucinations.  Patient has not followed up with a neurosurgeon as she does not have a way to get to that appointment or has no one to take her.  She also has a past medical history significant for [facility] spotted fever in 2010 stressed induced cardiomyopathy in 2011 and hydrocephalus in 2010.  The shunt was placed in 2010  11/13/2017-patient resting.. Assessment & Plan:   Active Problems:   Hyponatremia   Pressure injury of skin  1] severe symptomatic hyponatremia with sodium of 111 upon admission-today her sodium has come up to 131.  Because of the hyponatremia is thought to be secondary to hydrochlorothiazide.  I have added hydrochlorothiazide as an allergic list.transfer patient to med surg.  2] hypertension continue metoprolol, amlodipine.  3] mild protein calorie malnutrition with an albumin of 2.7 nutrition consult..  4] multiple electrolyte abnormalities including hypomagnesemia and hypophosphatemia question related to nutritional intake.  Replete phos. 5] severe deconditioning will get PT OT evaluation. 6] stage 2 sacral pressure ulcer present on admit.   Patient's overall prognosis is poor considering her severe malnutrition and sacral pressure ulcers.   DVT prophylaxis:scd Code Status: full Family Communication none Disposition Plan:  tbd  Consultants:none  Procedures: None Antimicrobials: None Subjective: Denies any specific complaints no nausea vomiting or diarrhea.  Objective: Vitals:   11/13/17 0730 11/13/17 0800 11/13/17 0900 11/13/17 1105  BP:  (!) 141/65 (!) 139/56   Pulse:  70 90   Resp:  16 18   Temp: 98.5 F (36.9 C)   98.3 F (36.8 C)  TempSrc: Oral   Oral  SpO2:  100% 99%   Weight:      Height:        Intake/Output Summary (Last 24 hours) at 11/13/2017 1251 Last data filed at 11/13/2017 0900 Gross per 24 hour  Intake 381.67 ml  Output 750 ml  Net -368.33 ml   Filed Weights   11/11/17 0450 11/12/17 0500 11/13/17 0449  Weight: 58.6 kg (129 lb 3 oz) 60 kg (132 lb 4.4 oz) 58.6 kg (129 lb 3 oz)    Examination:  General exam: Appears calm and comfortable  Respiratory system: Clear to auscultation. Respiratory effort normal. Cardiovascular system: S1 & S2 heard, RRR. No JVD, murmurs, rubs, gallops or clicks. No pedal edema. Gastrointestinal system: Abdomen is nondistended, soft and nontender. No organomegaly or masses felt. Normal bowel sounds heard. Central nervous system: Alert and oriented. No focal neurological deficits. Extremities: Symmetric 5 x 5 power. Skin: No rashes, lesions or ulcers Psychiatry: Judgement and insight appear normal. Mood & affect appropriate.     Data Reviewed: I have personally reviewed following labs and imaging studies  CBC: Recent Labs  Lab 11/10/17 0349 11/11/17 0440 11/12/17 0315 11/13/17 0312  WBC 7.4 4.5 10.6* 8.0  NEUTROABS 6.0  --   --   --   HGB  11.8* 9.7* 12.0 10.8*  HCT 31.3* 26.3* 33.1* 30.8*  MCV 92.3 95.6 97.1 99.7  PLT 326 337 530* 784*   Basic Metabolic Panel: Recent Labs  Lab 11/11/17 0344 11/11/17 1441 11/11/17 2204 11/12/17 0604 11/13/17 0312  NA 124* 132* 129* 130* 131*  K 4.0 4.4 4.2 4.2 4.2  CL 96* 100* 99* 100* 103  CO2 21* 24 19* 23 26  GLUCOSE 97 92 147* 145* 96  BUN 10 9 8 9 8   CREATININE 0.53 0.54 0.53 0.47 0.51   CALCIUM 7.7* 8.5* 8.1* 8.2* 8.1*  MG 1.5*  --   --   --  1.9  PHOS 2.3*  --   --   --  1.7*   GFR: Estimated Creatinine Clearance: 54.7 mL/min (by C-G formula based on SCr of 0.51 mg/dL). Liver Function Tests: Recent Labs  Lab 11/10/17 0349  AST 34  ALT 22  ALKPHOS 51  BILITOT 1.7*  PROT 5.0*  ALBUMIN 2.7*   No results for input(s): LIPASE, AMYLASE in the last 168 hours. Recent Labs  Lab 11/10/17 0349  AMMONIA 15   Coagulation Profile: No results for input(s): INR, PROTIME in the last 168 hours. Cardiac Enzymes: Recent Labs  Lab 11/10/17 0811 11/10/17 1256  TROPONINI 0.26* 0.17*   BNP (last 3 results) No results for input(s): PROBNP in the last 8760 hours. HbA1C: No results for input(s): HGBA1C in the last 72 hours. CBG: Recent Labs  Lab 11/11/17 0048 11/11/17 1953 11/12/17 0236  GLUCAP 71 124* 163*   Lipid Profile: No results for input(s): CHOL, HDL, LDLCALC, TRIG, CHOLHDL, LDLDIRECT in the last 72 hours. Thyroid Function Tests: No results for input(s): TSH, T4TOTAL, FREET4, T3FREE, THYROIDAB in the last 72 hours. Anemia Panel: No results for input(s): VITAMINB12, FOLATE, FERRITIN, TIBC, IRON, RETICCTPCT in the last 72 hours. Sepsis Labs: Recent Labs  Lab 11/10/17 0358 11/10/17 6962  LATICACIDVEN 0.75 0.80    Recent Results (from the past 240 hour(s))  MRSA PCR Screening     Status: None   Collection Time: 11/10/17  6:53 PM  Result Value Ref Range Status   MRSA by PCR NEGATIVE NEGATIVE Final    Comment:        The GeneXpert MRSA Assay (FDA approved for NASAL specimens only), is one component of a comprehensive MRSA colonization surveillance program. It is not intended to diagnose MRSA infection nor to guide or monitor treatment for MRSA infections.          Radiology Studies: No results found.      Scheduled Meds: . amLODipine  10 mg Oral Daily  . heparin  5,000 Units Subcutaneous Q8H  . metoprolol tartrate  25 mg Oral BID   . mirtazapine  7.5 mg Oral QHS   Continuous Infusions: . sodium chloride 50 mL/hr at 11/13/17 0933     LOS: 3 days      Georgette Shell, MD [facility]   If 7PM-7AM, please contact night-coverage [email or web address] Password [facility] 11/13/2017, 12:51 PM

## 2017-11-14 LAB — BASIC METABOLIC PANEL
Anion gap: 3 — ABNORMAL LOW (ref 5–15)
BUN: 6 mg/dL (ref 6–20)
CO2: 27 mmol/L (ref 22–32)
Calcium: 7.8 mg/dL — ABNORMAL LOW (ref 8.9–10.3)
Chloride: 103 mmol/L (ref 101–111)
Creatinine, Ser: 0.49 mg/dL (ref 0.44–1.00)
GFR calc Af Amer: >60 mL/min (ref >60)
GFR calc non Af Amer: >60 mL/min (ref >60)
GLUCOSE: 92 mg/dL (ref 65–99)
POTASSIUM: 4 mmol/L (ref 3.5–5.1)
Sodium: 133 mmol/L — ABNORMAL LOW (ref 135–145)

## 2017-11-14 NOTE — Progress Notes (Signed)
PROGRESS NOTE    Angela Duncan  WUJ:[id number] DOB: 05-Mar-1951 DOA: 11/10/2017 PCP: Fayrene Helper, MD   Brief Narrative:67 67-year-old female admitted with severe hyponatremia on 11/10/2017. Patient was admitted to the ICU under P CCM transfer to [facility] service 11/12/2017. Review of the records show that patient's family called 911 due to change in mental status. She received a dose of Haldol en route ED. Patient lives at home with this with her family. Patient in addition to that has a history of hypertension, history of VP shunt for hydrocephalus, asthma, anxiety, delirium, agitation, and paranoia and visual hallucinations. Patient has not followed up with a neurosurgeon as she does not have a way to get to that appointment or has no one to take her. 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 The shunt was placed in 2010  11/13/2017-patient resting..   Assessment & Plan:   Active Problems:   Hyponatremia   Pressure injury of skin   1]severe symptomatic hyponatremia with sodium of 111 upon admission-today her sodium has come up to 131. Because of the hyponatremia is thought to be secondary to hydrochlorothiazide. I have added hydrochlorothiazide as an allergic list.transfer patient to med surg.  DC IV fluids today sodium has increased to 133.  2]hypertension continue metoprolol, amlodipine.  3]mild protein calorie malnutrition with an albumin of 2.7 nutrition consult..  4]multiple electrolyte abnormalities including hypomagnesemia and hypophosphatemia question related to nutritional intake. Replete phos. 5]severe deconditioning will get PT OT evaluation. 6] stage 2 sacral pressure ulcer present on admit.   Patient's overall prognosis is poor considering her severe malnutrition and sacral pressure ulcers.      DVT prophylaxis: SCD Code Status:  Full code Family Communication no family available Disposition Plan: Hope to discharge her early this week to sniff will obtain PT OT evaluation. Consultants: None none Procedures: None Antimicrobials: None  Subjective: No specific complaints   Objective: Vitals:   11/13/17 1500 11/13/17 1530 11/13/17 2022 11/14/17 0606  BP: (!) 125/52 139/62 (!) 124/55 131/69  Pulse: 76 85 85 86  Resp: 19 18 16 15   Temp:  98.3 F (36.8 C) 98.7 F (37.1 C) 98.3 F (36.8 C)  TempSrc:  Oral Oral Oral  SpO2: 99% 97% 96% 99%  Weight:    60.1 kg (132 lb 7.9 oz)  Height:        Intake/Output Summary (Last 24 hours) at 11/14/2017 0917 Last data filed at 11/14/2017 9562 Gross per 24 hour  Intake 1080 ml  Output 375 ml  Net 705 ml   Filed Weights   11/12/17 0500 11/13/17 0449 11/14/17 0606  Weight: 60 kg (132 lb 4.4 oz) 58.6 kg (129 lb 3 oz) 60.1 kg (132 lb 7.9 oz)    Examination:  General exam: Appears calm and comfortable  Respiratory system: Clear to auscultation. Respiratory effort normal. Cardiovascular system: S1 & S2 heard, RRR. No JVD, murmurs, rubs, gallops or clicks. No pedal edema. Gastrointestinal system: Abdomen is nondistended, soft and nontender. No organomegaly or masses felt. Normal bowel sounds heard. Central nervous system: Alert and oriented. No focal neurological deficits. Extremities: Symmetric 5 x 5 power. Skin: No rashes, lesions or ulcers Psychiatry: Judgement and insight appear normal. Mood & affect appropriate.     Data Reviewed: I have personally reviewed following labs and imaging studies  CBC: Recent Labs  Lab 11/10/17 0349 11/11/17 0440 11/12/17 0315 11/13/17 1308  WBC 7.4 4.5 10.6* 8.0  NEUTROABS 6.0  --   --   --   HGB 11.8* 9.7* 12.0 10.8*  HCT 31.3* 26.3* 33.1* 30.8*  MCV 92.3 95.6 97.1 99.7  PLT 326 337 530* 161*   Basic Metabolic Panel: Recent Labs  Lab 11/11/17 0344 11/11/17 1441 11/11/17 2204 11/12/17 0604 11/13/17 0312  11/14/17 0622  NA 124* 132* 129* 130* 131* 133*  K 4.0 4.4 4.2 4.2 4.2 4.0  CL 96* 100* 99* 100* 103 103  CO2 21* 24 19* 23 26 27   GLUCOSE 97 92 147* 145* 96 92  BUN 10 9 8 9 8 6   CREATININE 0.53 0.54 0.53 0.47 0.51 0.49  CALCIUM 7.7* 8.5* 8.1* 8.2* 8.1* 7.8*  MG 1.5*  --   --   --  1.9  --   PHOS 2.3*  --   --   --  1.7*  --    GFR: Estimated Creatinine Clearance: 54.7 mL/min (by C-G formula based on SCr of 0.49 mg/dL). Liver Function Tests: Recent Labs  Lab 11/10/17 0349  AST 34  ALT 22  ALKPHOS 51  BILITOT 1.7*  PROT 5.0*  ALBUMIN 2.7*   No results for input(s): LIPASE, AMYLASE in the last 168 hours. Recent Labs  Lab 11/10/17 0349  AMMONIA 15   Coagulation Profile: No results for input(s): INR, PROTIME in the last 168 hours. Cardiac Enzymes: Recent Labs  Lab 11/10/17 0811 11/10/17 1256  TROPONINI 0.26* 0.17*   BNP (last 3 results) No results for input(s): PROBNP in the last 8760 hours. HbA1C: No results for input(s): HGBA1C in the last 72 hours. CBG: Recent Labs  Lab 11/11/17 0048 11/11/17 1953 11/12/17 0236  GLUCAP 71 124* 163*   Lipid Profile: No results for input(s): CHOL, HDL, LDLCALC, TRIG, CHOLHDL, LDLDIRECT in the last 72 hours. Thyroid Function Tests: No results for input(s): TSH, T4TOTAL, FREET4, T3FREE, THYROIDAB in the last 72 hours. Anemia Panel: No results for input(s): VITAMINB12, FOLATE, FERRITIN, TIBC, IRON, RETICCTPCT in the last 72 hours. Sepsis Labs: Recent Labs  Lab 11/10/17 0358 11/10/17 0960  LATICACIDVEN 0.75 0.80    Recent Results (from the past 240 hour(s))  MRSA PCR Screening     Status: None   Collection Time: 11/10/17  6:53 PM  Result Value Ref Range Status   MRSA by PCR NEGATIVE NEGATIVE Final    Comment:        The GeneXpert MRSA Assay (FDA approved for NASAL specimens only), is one component of a comprehensive MRSA colonization surveillance program. It is not intended to diagnose MRSA infection nor to  guide or monitor treatment for MRSA infections.          Radiology Studies: No results found.      Scheduled Meds: . amLODipine  10 mg Oral Daily  . heparin  5,000 Units Subcutaneous Q8H  . metoprolol tartrate  25 mg Oral BID  . mirtazapine  7.5 mg Oral QHS   Continuous Infusions: . sodium chloride 50 mL/hr at 11/13/17 0933     LOS: 4 days      Georgette Shell, MD [facility] If 7PM-7AM, please contact night-coverage [email or web address] Password [facility] 11/14/2017, 9:17 AM

## 2017-11-15 ENCOUNTER — Inpatient Hospital Stay (HOSPITAL_COMMUNITY): Payer: Medicare HMO

## 2017-11-15 LAB — CBC
HCT: 36.9 % (ref 36.0–46.0)
Hemoglobin: 13.1 g/dL (ref 12.0–15.0)
MCH: 35.1 pg — AB (ref 26.0–34.0)
MCHC: 35.5 g/dL (ref 30.0–36.0)
MCV: 98.9 fL (ref 78.0–100.0)
PLATELETS: 321 10*3/uL (ref 150–400)
RBC: 3.73 MIL/uL — ABNORMAL LOW (ref 3.87–5.11)
RDW: 13.4 % (ref 11.5–15.5)
WBC: 5.8 10*3/uL (ref 4.0–10.5)

## 2017-11-15 LAB — BASIC METABOLIC PANEL
Anion gap: 7 (ref 5–15)
BUN: 9 mg/dL (ref 6–20)
CHLORIDE: 99 mmol/L — AB (ref 101–111)
CO2: 24 mmol/L (ref 22–32)
CREATININE: 0.52 mg/dL (ref 0.44–1.00)
Calcium: 8.5 mg/dL — ABNORMAL LOW (ref 8.9–10.3)
GFR calc Af Amer: >60 mL/min (ref >60)
GFR calc non Af Amer: >60 mL/min (ref >60)
Glucose, Bld: 98 mg/dL (ref 65–99)
Potassium: 4.3 mmol/L (ref 3.5–5.1)
SODIUM: 130 mmol/L — AB (ref 135–145)

## 2017-11-15 MED ORDER — POTASSIUM CHLORIDE CRYS ER 20 MEQ PO TBCR
20.0000 meq | EXTENDED_RELEASE_TABLET | Freq: Every day | ORAL | Status: DC
  Administered 2017-11-15 – 2017-11-16 (×2): 20 meq via ORAL
  Filled 2017-11-15 (×2): qty 1

## 2017-11-15 MED ORDER — FUROSEMIDE 10 MG/ML IJ SOLN
20.0000 mg | Freq: Every day | INTRAMUSCULAR | Status: DC
  Administered 2017-11-15 – 2017-11-16 (×2): 20 mg via INTRAVENOUS
  Filled 2017-11-15 (×2): qty 2

## 2017-11-15 NOTE — Evaluation (Signed)
Occupational Therapy Evaluation Patient Details Name: Angela Duncan MRN: [id number] DOB: 1951/02/15 Today's Date: 11/15/2017    History of Present Illness Patient is a 67 y/o female admitted due to [place].  PMH positive for HTN, [facility] Spotted Fever 2010, VP shunt for hydrocephalus 2010. stress induced cardiomyopathy, asthma, anxiety, delerium, hallucinations and paranoia,    Clinical Impression   Pt was admitted for the above.  Until recently, she lived on her own and drove.  She was independent with all adls.  Pt currently needs up to max A for LB ADLS and fatiques quickly. Will follow in acute with min A level goals. Recommend continued post acute OT    Follow Up Recommendations  Home health OT;Supervision/Assistance - 24 hour(would benefit from SNF; family wants to take her home)    Equipment Recommendations       Recommendations for Other Services       Precautions / Restrictions Precautions Precautions: Fall Precaution Comments: oxygen dependent Restrictions Weight Bearing Restrictions: No      Mobility Bed Mobility Overal bed mobility: Modified Independent             General bed mobility comments: performed by PT  Transfers Overall transfer level: Needs assistance Equipment used: 1 person hand held assist Transfers: Sit to/from [facility] Sit to Stand: Min assist Stand pivot transfers: Min assist       General transfer comment: performed by PT    Balance Overall balance assessment: Needs assistance   Sitting balance-Leahy Scale: Good     Standing balance support: Single extremity supported;Bilateral upper extremity supported Standing balance-Leahy Scale: Poor Standing balance comment: needs support for balance up on her feet                           ADL either performed or assessed with clinical judgement   ADL Overall ADL's : Needs assistance/impaired Eating/Feeding: Set up;Bed level   Grooming: Set  up;Sitting   Upper Body Bathing: Set up;Sitting   Lower Body Bathing: Maximal assistance;Sit to/from stand   Upper Body Dressing : Minimal assistance;Sitting   Lower Body Dressing: Total assistance;Sit to/from stand                 General ADL Comments: pt had just gotten up to chair with PT.  Performed LB adl due to being wet.  Pt with LE edema and WOB increased.  Sats 99% on 2 liters, dyspnea 2-3/4     Vision         Perception     Praxis      Pertinent Vitals/Pain Pain Assessment: No/denies pain     Hand Dominance     Extremity/Trunk Assessment Upper Extremity Assessment Upper Extremity Assessment: Generalized weakness   Lower Extremity Assessment by PT Lower Extremity Assessment: Generalized weakness;LLE deficits/detail;RLE deficits/detail RLE Deficits / Details: pitting edema and shiny skin texture bilateral LE's LLE Deficits / Details: pitting edema and shiny skin texture bilateral LE's       Communication Communication Communication: No difficulties   Cognition Arousal/Alertness: Awake/alert Behavior During Therapy: Anxious Overall Cognitive Status: Impaired/Different from baseline Area of Impairment: Orientation;Memory;Attention;Safety/judgement                 Orientation Level: Disoriented to;Time;Place Current Attention Level: Sustained Memory: Decreased short-term memory;Decreased recall of precautions   Safety/Judgement: Decreased awareness of safety;Decreased awareness of deficits     General Comments: intermittently confused, but has had abnormal electorlytes family  states was better when they replaced, but had to stop fluids due to swelling and now she is confused again (but was hypoxic @ 82% initially due to O2 off at wall.).  Pt very aware of noise and yelling for someone to come in; person was next door    General Comments  Spoke to family the first time I checked on pt:  She was eating at that time    Exercises      Shoulder Instructions      Home Living Family/patient expects to be discharged to:: Private residence Living Arrangements: Alone                               Additional Comments: family states pt will return home with them after hospitalization      Prior Functioning/Environment Level of Independence: Independent        Comments: normally independent        OT Problem List: Decreased strength;Decreased activity tolerance;Impaired balance (sitting and/or standing);Decreased cognition;Decreased safety awareness;Increased edema;Cardiopulmonary status limiting activity      OT Treatment/Interventions: Self-care/ADL training;DME and/or AE instruction;Cognitive remediation/compensation;Patient/family education;Balance training;Energy conservation    OT Goals(Current goals can be found in the care plan section) Acute Rehab OT Goals Patient Stated Goal: To get better OT Goal Formulation: Patient unable to participate in goal setting Time For Goal Achievement: 11/29/17 Potential to Achieve Goals: Good ADL Goals Pt Will Perform Grooming: with min guard assist;standing Pt Will Transfer to Toilet: with min guard assist;ambulating;bedside commode Pt Will Perform Toileting - Clothing Manipulation and hygiene: with min guard assist;sit to/from stand Additional ADL Goal #1: Pt will perform UB adls with set up and LB adls with min A, sit to stand with 3 rest breaks for energy conservation Additional ADL Goal #2: Pt will maintain selective attention to adls in min distracting environment  OT Frequency: Min 2X/week   Barriers to D/C:            Co-evaluation              AM-PAC PT "6 Clicks" Daily Activity     Outcome Measure Help from another person eating meals?: A Little Help from another person taking care of personal grooming?: A Little Help from another person toileting, which includes using toliet, bedpan, or urinal?: A Lot Help from another person bathing  (including washing, rinsing, drying)?: A Lot Help from another person to put on and taking off regular upper body clothing?: A Little Help from another person to put on and taking off regular lower body clothing?: A Lot 6 Click Score: 15   End of Session    Activity Tolerance: Patient limited by fatigue Patient left: in chair;with call bell/phone within reach;with chair alarm set  OT Visit Diagnosis: Unsteadiness on feet (R26.81);Muscle weakness (generalized) (M62.81)                Time: 1610-9604 OT Time Calculation (min): 21 min Charges:  OT General Charges $OT Visit: 1 Visit OT Evaluation $OT Eval Moderate Complexity: 1 Mod G-Codes:     [place], OTR/L [phone] 11/15/2017  Tersa Fotopoulos 11/15/2017, 3:20 PM

## 2017-11-15 NOTE — Evaluation (Signed)
Physical Therapy Evaluation Patient Details Name: Angela Duncan MRN: [id number] DOB: 1951-10-18 Today's Date: 11/15/2017   History of Present Illness  Patient is a 67 y/o female admitted due to Angela Duncan.  PMH positive for HTN, [facility] Spotted Fever 2010, VP shunt for hydrocephalus 2010. stress induced cardiomyopathy, asthma, anxiety, delerium, hallucinations and paranoia,   Clinical Impression  Patient presents with decreased independence with mobility due to weakness, decreased cognition, decreased cardiopulmonary endurance and will benefit from skilled PT in the acute setting to allow return home following SNF level rehab stay.      Follow Up Recommendations SNF;Supervision/Assistance - 24 hour    Equipment Recommendations  Other (comment)(TBA)    Recommendations for Other Services       Precautions / Restrictions Precautions Precautions: Fall Precaution Comments: oxygen dependent      Mobility  Bed Mobility Overal bed mobility: Modified Independent             General bed mobility comments: with HOB elevated  Transfers Overall transfer level: Needs assistance Equipment used: 1 person hand held assist Transfers: Sit to/from [facility] Sit to Stand: Min assist Stand pivot transfers: Min assist       General transfer comment: only agreeable to OOB to chair due to SOB  Ambulation/Gait                Stairs            Wheelchair Mobility    Modified Rankin (Stroke Patients Only)       Balance Overall balance assessment: Needs assistance   Sitting balance-Leahy Scale: Good     Standing balance support: Single extremity supported;Bilateral upper extremity supported Standing balance-Leahy Scale: Poor Standing balance comment: needs support for balance up on her feet                             Pertinent Vitals/Pain Pain Assessment: No/denies pain    Home Living Family/patient expects to be discharged  to:: Skilled nursing facility Living Arrangements: Alone               Additional Comments: lives alone in [place], family has been taking care of her since started having difficulty a few weeks ago    Prior Function Level of Independence: Independent         Comments: normally independent     Hand Dominance        Extremity/Trunk Assessment   Upper Extremity Assessment Upper Extremity Assessment: Defer to OT evaluation    Lower Extremity Assessment Lower Extremity Assessment: Generalized weakness;LLE deficits/detail;RLE deficits/detail RLE Deficits / Details: pitting edema and shiny skin texture bilateral LE's LLE Deficits / Details: pitting edema and shiny skin texture bilateral LE's       Communication   Communication: No difficulties  Cognition Arousal/Alertness: Awake/alert Behavior During Therapy: Anxious Overall Cognitive Status: Impaired/Different from baseline Area of Impairment: Orientation;Memory;Attention;Safety/judgement                 Orientation Level: Disoriented to;Time;Place Current Attention Level: Sustained Memory: Decreased short-term memory;Decreased recall of precautions   Safety/Judgement: Decreased awareness of safety;Decreased awareness of deficits     General Comments: intermittently confused, but has had abnormal electorlytes family states was better when they replaced, but had to stop fluids due to swelling and now she is confused again (but was hypoxic @ 82% initially due to O2 off at wall.)      General Comments  General comments (skin integrity, edema, etc.): family in room initially and assisted with history; pt incontinent and bed soiled despite purewick.  Assist with OT for hygiene and bed change    Exercises     Assessment/Plan    PT Assessment Patient needs continued PT services  PT Problem List Decreased strength;Decreased mobility;Decreased safety awareness;Decreased activity tolerance;Decreased  balance;Decreased knowledge of use of DME;Decreased cognition;Decreased knowledge of precautions       PT Treatment Interventions DME instruction;Gait training;Therapeutic activities;Therapeutic exercise;Cognitive remediation;Patient/family education;Balance training;Functional mobility training    PT Goals (Current goals can be found in the Care Plan section)  Acute Rehab PT Goals Patient Stated Goal: To get better PT Goal Formulation: With patient/family Potential to Achieve Goals: Good    Frequency Min 3X/week   Barriers to discharge        Co-evaluation               AM-PAC PT "6 Clicks" Daily Activity  Outcome Measure Difficulty turning over in bed (including adjusting bedclothes, sheets and blankets)?: None Difficulty moving from lying on back to sitting on the side of the bed? : None Difficulty sitting down on and standing up from a chair with arms (e.g., wheelchair, bedside commode, etc,.)?: Unable Help needed moving to and from a bed to chair (including a wheelchair)?: A Little Help needed walking in hospital room?: A Lot Help needed climbing 3-5 steps with a railing? : Total 6 Click Score: 15    End of Session Equipment Utilized During Treatment: Gait belt;Oxygen Activity Tolerance: Patient limited by fatigue Patient left: in chair;with chair alarm set;with call bell/phone within reach   PT Visit Diagnosis: Muscle weakness (generalized) (M62.81);Other abnormalities of gait and mobility (R26.89)    Time: 1400-1425 PT Time Calculation (min) (ACUTE ONLY): 25 min   Charges:   PT Evaluation $PT Eval Moderate Complexity: 1 Mod     PT G CodesMagda Kiel, [place] [phone] 11/15/2017   Reginia Naas 11/15/2017, 3:04 PM

## 2017-11-15 NOTE — Progress Notes (Signed)
PROGRESS NOTE    Angela Duncan  BWI:[id number] DOB: 1951/06/15 DOA: 11/10/2017 PCP: Fayrene Helper, MD  Brief Narrative:67 year old female admitted with severe hyponatremia on 11/10/2017. Patient was admitted to the ICU under P CCM transfer to [facility] service 11/12/2017. Review of the records show that patient's family called 911 due to change in mental status. She received a dose of Haldol en route ED. Patient lives at home with this with her family. Patient in addition to that has a history of hypertension, history of VP shunt for hydrocephalus, asthma, anxiety, delirium, agitation, and paranoia and visual hallucinations. Patient has not followed up with a neurosurgeon as she does not have a way to get to that appointment or has no one to take her. She also has a past medical history significant for [facility] spotted fever in 2010 stressed induced cardiomyopathy in 2011 and hydrocephalus in 2010. The shunt was placed in 2010  11/13/2017-patient resting..  1/21-resting in bed.appears tachypneic.lasix one dose given.stat chest xray-no acute changes.  Assessment & Plan:   Active Problems:   Hyponatremia   Pressure injury of skin  1]severe symptomatic hyponatremia with sodium of 111 upon admission-today her sodium has come up to 131.Because of the hyponatremia is thought to be secondary to hydrochlorothiazide. I have added hydrochlorothiazide as an allergic list.transfer patient to med surg.  DC IV fluids today sodium has increased to 130.  2]hypertension continue metoprolol, amlodipine.  3]mild protein calorie malnutrition with an albumin of 2.7 nutrition consult..  4]multiple electrolyte abnormalities including hypomagnesemia and hypophosphatemia question related to nutritional intake. Replete phos. 5]severe deconditioning will get PT OT evaluation. 6] stage 2 sacral pressure ulcer present on admit.   Patient's overall prognosis is poor considering her severe  malnutrition and sacral pressure ulcers.      DVT prophylaxis: scd Code Status:full Family Communication none Disposition Plan:plan dc tomorrow pending pt Consultants: none Procedures: none Antimicrobials: none Subjective:appears anxious.   Objective: Vitals:   11/14/17 0606 11/14/17 1353 11/14/17 2144 11/15/17 0512  BP: 131/69 (!) 131/56 140/67 (!) 146/67  Pulse: 86 86 91 83  Resp: 15 16 17 18   Temp: 98.3 F (36.8 C) 98.3 F (36.8 C) 99 F (37.2 C) 98.6 F (37 C)  TempSrc: Oral Oral Oral Oral  SpO2: 99% 98% 98% 97%  Weight: 60.1 kg (132 lb 7.9 oz)   67.6 kg (149 lb 0.5 oz)  Height:        Intake/Output Summary (Last 24 hours) at 11/15/2017 1403 Last data filed at 11/15/2017 0543 Gross per 24 hour  Intake 1660 ml  Output 200 ml  Net 1460 ml   Filed Weights   11/13/17 0449 11/14/17 0606 11/15/17 0512  Weight: 58.6 kg (129 lb 3 oz) 60.1 kg (132 lb 7.9 oz) 67.6 kg (149 lb 0.5 oz)    Examination:  General exam: Appears anxious Respiratory system: Clear to auscultation. Respiratory effort normal. Cardiovascular system: S1 & S2 heard, RRR. No JVD, murmurs, rubs, gallops or clicks. No pedal edema. Gastrointestinal system: Abdomen is nondistended, soft and nontender. No organomegaly or masses felt. Normal bowel sounds heard. Central nervous system: Alert and oriented. No focal neurological deficits. Extremities: Symmetric 5 x 5 power. Skin: No rashes, lesions or ulcers    Data Reviewed: I have personally reviewed following labs and imaging studies  CBC: Recent Labs  Lab 11/10/17 0349 11/11/17 0440 11/12/17 0315 11/13/17 0312 11/15/17 0806  WBC 7.4 4.5 10.6* 8.0 5.8  NEUTROABS 6.0  --   --   --   --  HGB 11.8* 9.7* 12.0 10.8* 13.1  HCT 31.3* 26.3* 33.1* 30.8* 36.9  MCV 92.3 95.6 97.1 99.7 98.9  PLT 326 337 530* 420* 401   Basic Metabolic Panel: Recent Labs  Lab 11/11/17 0344  11/11/17 2204 11/12/17 0604 11/13/17 0312 11/14/17 0622  11/15/17 0806  NA 124*   < > 129* 130* 131* 133* 130*  K 4.0   < > 4.2 4.2 4.2 4.0 4.3  CL 96*   < > 99* 100* 103 103 99*  CO2 21*   < > 19* 23 26 27 24   GLUCOSE 97   < > 147* 145* 96 92 98  BUN 10   < > 8 9 8 6 9   CREATININE 0.53   < > 0.53 0.47 0.51 0.49 0.52  CALCIUM 7.7*   < > 8.1* 8.2* 8.1* 7.8* 8.5*  MG 1.5*  --   --   --  1.9  --   --   PHOS 2.3*  --   --   --  1.7*  --   --    < > = values in this interval not displayed.   GFR: Estimated Creatinine Clearance: 62.4 mL/min (by C-G formula based on SCr of 0.52 mg/dL). Liver Function Tests: Recent Labs  Lab 11/10/17 0349  AST 34  ALT 22  ALKPHOS 51  BILITOT 1.7*  PROT 5.0*  ALBUMIN 2.7*   No results for input(s): LIPASE, AMYLASE in the last 168 hours. Recent Labs  Lab 11/10/17 0349  AMMONIA 15   Coagulation Profile: No results for input(s): INR, PROTIME in the last 168 hours. Cardiac Enzymes: Recent Labs  Lab 11/10/17 0811 11/10/17 1256  TROPONINI 0.26* 0.17*   BNP (last 3 results) No results for input(s): PROBNP in the last 8760 hours. HbA1C: No results for input(s): HGBA1C in the last 72 hours. CBG: Recent Labs  Lab 11/11/17 0048 11/11/17 1953 11/12/17 0236  GLUCAP 71 124* 163*   Lipid Profile: No results for input(s): CHOL, HDL, LDLCALC, TRIG, CHOLHDL, LDLDIRECT in the last 72 hours. Thyroid Function Tests: No results for input(s): TSH, T4TOTAL, FREET4, T3FREE, THYROIDAB in the last 72 hours. Anemia Panel: No results for input(s): VITAMINB12, FOLATE, FERRITIN, TIBC, IRON, RETICCTPCT in the last 72 hours. Sepsis Labs: Recent Labs  Lab 11/10/17 0358 11/10/17 0272  LATICACIDVEN 0.75 0.80    Recent Results (from the past 240 hour(s))  MRSA PCR Screening     Status: None   Collection Time: 11/10/17  6:53 PM  Result Value Ref Range Status   MRSA by PCR NEGATIVE NEGATIVE Final    Comment:        The GeneXpert MRSA Assay (FDA approved for NASAL specimens only), is one component of  a comprehensive MRSA colonization surveillance program. It is not intended to diagnose MRSA infection nor to guide or monitor treatment for MRSA infections.          Radiology Studies: Dg Chest 1 View  Result Date: 11/15/2017 CLINICAL DATA:  Hypoxia EXAM: CHEST 1 VIEW COMPARISON:  11/11/2017 FINDINGS: Cardiac shadow is stable. Aortic calcifications are again seen. Left-sided shunt catheter is again identified and stable. The lungs are well aerated bilaterally. No acute bony abnormality is seen. IMPRESSION: No acute abnormality noted. Electronically Signed   By: Inez Catalina M.D.   On: 11/15/2017 08:02        Scheduled Meds: . amLODipine  10 mg Oral Daily  . furosemide  20 mg Intravenous Daily  . heparin  5,000 Units  Subcutaneous Q8H  . metoprolol tartrate  25 mg Oral BID  . mirtazapine  7.5 mg Oral QHS  . potassium chloride  20 mEq Oral Daily   Continuous Infusions:   LOS: 5 days      Georgette Shell, MD [facility]  If 7PM-7AM, please contact night-coverage [email or web address] Password [facility] 11/15/2017, 2:03 PM

## 2017-11-15 NOTE — Clinical Social Work Note (Signed)
Clinical Social Work Assessment  Patient Details  Name: Angela Duncan MRN: [id number] Date of Birth: 1951-09-26  Date of referral:  11/15/17               Reason for consult:  Facility Placement                Permission sought to share information with:  Case Manager, Customer service manager, Family Supports Permission granted to share information::  Yes, Verbal Permission Granted  Name::     Optician, dispensing::     Relationship::  sister  Contact Information:     Housing/Transportation Living arrangements for the past 2 months:  [place] of Information:  Patient, Siblings Patient Interpreter Needed:  None Criminal Activity/Legal Involvement Pertinent to Current Situation/Hospitalization:  No - Comment as needed Significant Relationships:  Other Family Members, Siblings Lives with:  Self Do you feel safe going back to the place where you live?  Yes Need for family participation in patient care:  Yes (Comment)  Care giving concerns:  Patient lives alone in [place]. Family in [place].    Social Worker assessment / plan:  LCSW consulted for SNF placement.   Patient admitted for altered mental status.  LCSW met with patient at bedside. Patients family was present.   Patient and family reports that patient plans to go to her brothers house in [place] with home health. LCSW notified RNCM of patients plan.   Patient reports that she lives alone in [place]. At baseline patient reports that she is independent in her ADLs. Patient states she cooks, cleans and is responsible for all her personal hygiene. Patient reports that she still drives. Patient states that she ambulates without assistance.   PLAN: Patient will go home with home health.   Employment status:  Retired Nurse, adult PT Recommendations:  [place], [place] / Referral to community resources:  [place]  Patient/Family's Response to care:  Patient and family had no concern at the time of assessment. Patient thankful for LCSW services.   Patient/Family's Understanding of and Emotional Response to Diagnosis, Current Treatment, and Prognosis:  Patient and family understanding of current diagnosis. Patient prefers to go home with home health vs. SNF.   Emotional Assessment Appearance:  Appears older than stated age Attitude/Demeanor/Rapport:    Affect (typically observed):  Calm, Pleasant Orientation:  Oriented to Self, Oriented to Place, Oriented to  Time, Oriented to Situation Alcohol / Substance use:  Not Applicable Psych involvement (Current and /or in the community):  No (Comment)  Discharge Needs  Concerns to be addressed:  No discharge needs identified Readmission within the last 30 days:  No Current discharge risk:  Lives alone Barriers to Discharge:  Continued Medical Work up   [employer], LCSW 11/15/2017, 3:34 PM

## 2017-11-16 MED ORDER — PRAVASTATIN SODIUM 10 MG PO TABS: 10.0000 mg | ORAL_TABLET | Freq: Every day | ORAL | 0 refills | Status: DC

## 2017-11-16 MED ORDER — POTASSIUM CHLORIDE ER 10 MEQ PO TBCR: 10.0000 meq | EXTENDED_RELEASE_TABLET | Freq: Every day | ORAL | 0 refills | Status: AC

## 2017-11-16 MED ORDER — LOVASTATIN 40 MG PO TABS: 40.0000 mg | ORAL_TABLET | Freq: Every day | ORAL | 4 refills | Status: DC

## 2017-11-16 MED ORDER — PRAVASTATIN SODIUM 20 MG PO TABS: 10.0000 mg | ORAL_TABLET | Freq: Every day | ORAL | Status: DC

## 2017-11-16 MED ORDER — FUROSEMIDE 20 MG PO TABS: 20.0000 mg | ORAL_TABLET | Freq: Every day | ORAL | 11 refills | Status: DC

## 2017-11-16 MED ORDER — LISINOPRIL 5 MG PO TABS: 5.0000 mg | ORAL_TABLET | Freq: Every day | ORAL | 11 refills | Status: AC

## 2017-11-16 NOTE — Care Management Important Message (Signed)
Important Message  Patient Details  Name: Angela Duncan MRN: [id number] Date of Birth: Dec 23, 1950   Medicare Important Message Given:  Yes    Kerin Salen 11/16/2017, 11:09 AMImportant Message  Patient Details  Name: Angela Duncan MRN: [id number] Date of Birth: 1951/02/14   Medicare Important Message Given:  Yes    Kerin Salen 11/16/2017, 11:09 AM

## 2017-11-16 NOTE — Progress Notes (Signed)
Pt discharged from the unit via wheelchair. Discharge instructions were reviewed with patient and family member. Phone number provided with [facility]. No questions or concerns at this time.  Shaquella Stamant W Gopal Malter, RN

## 2017-11-16 NOTE — Discharge Summary (Signed)
Physician Discharge Summary  Angela Duncan PYK:[id number] DOB: April 15, 1951 DOA: 11/10/2017  PCP: Fayrene Helper, MD  Admit date: 11/10/2017 Discharge date: 11/16/2017  Admitted From: home 1. Disposition: home 2.   Follow up with PCP in 1-2 weeks 3. Please obtain BMP/CBC in one week   Home Health:yes Equipment/Devicesnone Discharge Condition:stable CODE STATUS:full Diet recommendation cardiac Brief/Interim Summary::67 year old female admitted with severe hyponatremia on 11/10/2017. Patient was admitted to the ICU under P CCM transfer to [facility] service 11/12/2017. Review of the records show that patient's family called 911 due to change in mental status. She received a dose of Haldol en route ED. Patient lives at home with this with her family. Patient in addition to that has a history of hypertension, history of VP shunt for hydrocephalus, asthma, anxiety, delirium, agitation, and paranoia and visual hallucinations. Patient has not followed up with a neurosurgeon as she does not have a way to get to that appointment or has no one to take her. She also has a past medical history significant for [facility] spotted fever in 2010 stressed induced cardiomyopathy in 2011 and hydrocephalus in 2010. The shunt was placed in 2010  11/13/2017-patient resting..  1/21-resting in bed.appears tachypneic.lasix one dose given.stat chest xray-no acute changes.    Discharge Diagnoses:  Active Problems:   Hyponatremia   Pressure injury of skin  1]severe symptomatic hyponatremia with sodium of 111 upon admission-today her sodium has come up to 131.Because of the hyponatremia is thought to be secondary to hydrochlorothiazide. I have added hydrochlorothiazide as an allergic list.will discharge on lasix 20 mg daily.  2]hypertension continue metoprolol, amlodipine.  3] stage 2 sacral pressure ulcer present on admit.     Discharge Instructions follow up[ with pcp   Allergies as  of 11/16/2017      Reactions   Hydrochlorothiazide    CAUSES LIFE THREATENING HYPONATREMIA NA 111   Penicillins    Made her sleep all day Has patient had a PCN reaction causing immediate rash, facial/tongue/throat swelling, SOB or lightheadedness with hypotension YES Has patient had a PCN reaction causing severe rash involving mucus membranes or skin necrosis: No Has patient had a PCN reaction that required hospitalization: No Has patient had a PCN reaction occurring within the last 10 years: No If all of the above answers are "NO", then may proceed with Cephalosporin use.      Medication List    STOP taking these medications   hydrochlorothiazide 25 MG tablet Commonly known as:  HYDRODIURIL   levofloxacin 750 MG tablet Commonly known as:  LEVAQUIN   mirtazapine 15 MG tablet Commonly known as:  REMERON     TAKE these medications   amLODipine 10 MG tablet Commonly known as:  NORVASC TAKE ONE TABLET BY MOUTH DAILY   ASPIRIN LOW DOSE 81 MG EC tablet Generic drug:  aspirin Take 81 mg by mouth daily.   BREO ELLIPTA 100-25 MCG/INH Aepb Generic drug:  fluticasone furoate-vilanterol Inhale 1 puff into the lungs daily.   furosemide 20 MG tablet Commonly known as:  LASIX Take 1 tablet (20 mg total) by mouth daily.   INCRUSE ELLIPTA 62.5 MCG/INH Aepb Generic drug:  umeclidinium bromide Inhale 1 puff into the lungs daily.   lisinopril 5 MG tablet Commonly known as:  PRINIVIL,ZESTRIL Take 1 tablet (5 mg total) by mouth daily. What changed:    medication strength  how much to take   lovastatin 40 MG tablet Commonly known as:  MEVACOR Take 1 tablet (40 mg  total) by mouth at bedtime. What changed:  See the new instructions.   metoprolol tartrate 25 MG tablet Commonly known as:  LOPRESSOR Take 1 tablet (25 mg total) by mouth 2 (two) times daily.   potassium chloride 10 MEQ tablet Commonly known as:  K-DUR Take 1 tablet (10 mEq total) by mouth daily.      Follow-up  Information    Fayrene Helper, MD Follow up.   Specialty:  Family Medicine Contact information: [address] [place] [address] [phone]          Allergies  Allergen Reactions  . Hydrochlorothiazide     CAUSES LIFE THREATENING HYPONATREMIA NA 111  . Penicillins     Made her sleep all day Has patient had a PCN reaction causing immediate rash, facial/tongue/throat swelling, SOB or lightheadedness with hypotension YES Has patient had a PCN reaction causing severe rash involving mucus membranes or skin necrosis: No Has patient had a PCN reaction that required hospitalization: No Has patient had a PCN reaction occurring within the last 10 years: No If all of the above answers are "NO", then may proceed with Cephalosporin use.     Consultations:  none   Procedures/Studies: Dg Chest 1 View  Result Date: 11/15/2017 CLINICAL DATA:  Hypoxia EXAM: CHEST 1 VIEW COMPARISON:  11/11/2017 FINDINGS: Cardiac shadow is stable. Aortic calcifications are again seen. Left-sided shunt catheter is again identified and stable. The lungs are well aerated bilaterally. No acute bony abnormality is seen. IMPRESSION: No acute abnormality noted. Electronically Signed   By: Inez Catalina M.D.   On: 11/15/2017 08:02   Dg Chest 2 View  Result Date: 11/10/2017 CLINICAL DATA:  67 year old female with altered mental status. EXAM: CHEST  2 VIEW COMPARISON:  None. FINDINGS: Left IJ central venous line with tip over the spine just below the diaphragm. There is mild emphysematous changes of the lungs. No focal consolidation, pleural effusion, or pneumothorax. The cardiac silhouette is within normal limits. No acute osseous pathology. IMPRESSION: No active cardiopulmonary disease. Electronically Signed   By: Anner Crete M.D.   On: 11/10/2017 04:33   Ct Head Wo Contrast  Result Date: 11/10/2017 CLINICAL DATA:  67 y/o F; visual hallucinations, agitation, increasing paranoia, recent  diagnosis of delirium. EXAM: CT HEAD WITHOUT CONTRAST TECHNIQUE: Contiguous axial images were obtained from the base of the skull through the vertex without intravenous contrast. COMPARISON:  12/26/2013 CT head FINDINGS: Brain: No evidence of acute infarction, hemorrhage, hydrocephalus, extra-axial collection or mass lesion/mass effect. Left parietal approach ventriculostomy catheter with tip and right lateral ventricle near foramen of Monro, stable. Stable ventricle size. Stable chronic infarct of corpus callosum. Interval progression of moderate chronic microvascular ischemic changes and mild parenchymal volume loss of the brain. Vascular: No hyperdense vessel or unexpected calcification. Skull: Normal. Negative for fracture or focal lesion. Sinuses/Orbits: Small fluid levels in the maxillary sinuses. Otherwise negative. Other: None. IMPRESSION: 1. No acute intracranial abnormality identified. 2. Progression of moderate chronic microvascular ischemic changes. Stable chronic infarct of corpus callosum. 3. Stable position of left parietal approach ventriculostomy catheter. Stable ventricle size. 4. Fluid levels in maxillary sinus may represent acute sinusitis. Electronically Signed   By: Kristine Garbe M.D.   On: 11/10/2017 04:52   Dg Chest Port 1 View  Result Date: 11/11/2017 CLINICAL DATA:  Respiratory failure EXAM: PORTABLE CHEST 1 VIEW COMPARISON:  11/10/2017 FINDINGS: Heart size and vascularity normal. Lungs remain clear without infiltrate or effusion. Shunt tubing overlies the  left chest. IMPRESSION: No active disease. Electronically Signed   By: Franchot Gallo M.D.   On: 11/11/2017 09:09   (Echo, Carotid, EGD, Colonoscopy, ERCP)    Subjective:   Discharge Exam: Vitals:   11/15/17 2050 11/16/17 0628  BP: (!) 146/57 (!) 119/58  Pulse: 97 87  Resp: (!) 22   Temp: 99 F (37.2 C) 99.1 F (37.3 C)  SpO2: 94% 95%   Vitals:   11/15/17 0512 11/15/17 1449 11/15/17 2050 11/16/17 0628   BP: (!) 146/67 (!) 144/56 (!) 146/57 (!) 119/58  Pulse: 83 94 97 87  Resp: 18 20 (!) 22   Temp: 98.6 F (37 C) 98.1 F (36.7 C) 99 F (37.2 C) 99.1 F (37.3 C)  TempSrc: Oral Oral Oral Oral  SpO2: 97% 94% 94% 95%  Weight: 149 lb 0.5 oz (67.6 kg)     Height:        General: Pt is alert, awake, not in acute distress Cardiovascular: RRR, S1/S2 +, no rubs, no gallops Respiratory: CTA bilaterally, no wheezing, no rhonchi Abdominal: Soft, NT, ND, bowel sounds + Extremities: no edema, no cyanosis    The results of significant diagnostics from this hospitalization (including imaging, microbiology, ancillary and laboratory) are listed below for reference.     Microbiology: Recent Results (from the past 240 hour(s))  MRSA PCR Screening     Status: None   Collection Time: 11/10/17  6:53 PM  Result Value Ref Range Status   MRSA by PCR NEGATIVE NEGATIVE Final    Comment:        The GeneXpert MRSA Assay (FDA approved for NASAL specimens only), is one component of a comprehensive MRSA colonization surveillance program. It is not intended to diagnose MRSA infection nor to guide or monitor treatment for MRSA infections.      Labs: BNP (last 3 results) No results for input(s): BNP in the last 8760 hours. Basic Metabolic Panel: Recent Labs  Lab 11/11/17 0344  11/11/17 2204 11/12/17 0604 11/13/17 0312 11/14/17 0622 11/15/17 0806  NA 124*   < > 129* 130* 131* 133* 130*  K 4.0   < > 4.2 4.2 4.2 4.0 4.3  CL 96*   < > 99* 100* 103 103 99*  CO2 21*   < > 19* 23 26 27 24   GLUCOSE 97   < > 147* 145* 96 92 98  BUN 10   < > 8 9 8 6 9   CREATININE 0.53   < > 0.53 0.47 0.51 0.49 0.52  CALCIUM 7.7*   < > 8.1* 8.2* 8.1* 7.8* 8.5*  MG 1.5*  --   --   --  1.9  --   --   PHOS 2.3*  --   --   --  1.7*  --   --    < > = values in this interval not displayed.   Liver Function Tests: Recent Labs  Lab 11/10/17 0349  AST 34  ALT 22  ALKPHOS 51  BILITOT 1.7*  PROT 5.0*  ALBUMIN  2.7*   No results for input(s): LIPASE, AMYLASE in the last 168 hours. Recent Labs  Lab 11/10/17 0349  AMMONIA 15   CBC: Recent Labs  Lab 11/10/17 0349 11/11/17 0440 11/12/17 0315 11/13/17 0312 11/15/17 0806  WBC 7.4 4.5 10.6* 8.0 5.8  NEUTROABS 6.0  --   --   --   --   HGB 11.8* 9.7* 12.0 10.8* 13.1  HCT 31.3* 26.3* 33.1* 30.8* 36.9  MCV  92.3 95.6 97.1 99.7 98.9  PLT 326 337 530* 420* 321   Cardiac Enzymes: Recent Labs  Lab 11/10/17 0811 11/10/17 1256  TROPONINI 0.26* 0.17*   BNP: Invalid input(s): POCBNP CBG: Recent Labs  Lab 11/11/17 0048 11/11/17 1953 11/12/17 0236  GLUCAP 71 124* 163*   D-Dimer No results for input(s): DDIMER in the last 72 hours. Hgb A1c No results for input(s): HGBA1C in the last 72 hours. Lipid Profile No results for input(s): CHOL, HDL, LDLCALC, TRIG, CHOLHDL, LDLDIRECT in the last 72 hours. Thyroid function studies No results for input(s): TSH, T4TOTAL, T3FREE, THYROIDAB in the last 72 hours.  Invalid input(s): FREET3 Anemia work up No results for input(s): VITAMINB12, FOLATE, FERRITIN, TIBC, IRON, RETICCTPCT in the last 72 hours. Urinalysis    Component Value Date/Time   COLORURINE YELLOW 11/10/2017 0438   APPEARANCEUR CLEAR 11/10/2017 0438   LABSPEC 1.011 11/10/2017 0438   PHURINE 6.0 11/10/2017 0438   GLUCOSEU 50 (A) 11/10/2017 0438   HGBUR SMALL (A) 11/10/2017 0438   BILIRUBINUR NEGATIVE 11/10/2017 0438   BILIRUBINUR neg 07/28/2013 0939   KETONESUR 5 (A) 11/10/2017 0438   PROTEINUR NEGATIVE 11/10/2017 0438   UROBILINOGEN 0.2 07/28/2013 0939   UROBILINOGEN 0.2 02/20/2011 1118   NITRITE NEGATIVE 11/10/2017 0438   LEUKOCYTESUR NEGATIVE 11/10/2017 0438   Sepsis Labs Invalid input(s): PROCALCITONIN,  WBC,  LACTICIDVEN Microbiology Recent Results (from the past 240 hour(s))  MRSA PCR Screening     Status: None   Collection Time: 11/10/17  6:53 PM  Result Value Ref Range Status   MRSA by PCR NEGATIVE NEGATIVE Final     Comment:        The GeneXpert MRSA Assay (FDA approved for NASAL specimens only), is one component of a comprehensive MRSA colonization surveillance program. It is not intended to diagnose MRSA infection nor to guide or monitor treatment for MRSA infections.      Time coordinating discharge: Over 30 minutes  SIGNED:    MD  [facility] 11/16/2017, 2:07 PM Pager   If 7PM-7AM, please contact night-coverage [email or web address] Password [email or web address]

## 2017-11-16 NOTE — Discharge Summary (Signed)
Physician Discharge Summary  Angela Duncan GLO:[id number] DOB: 1951-09-07 DOA: 11/10/2017  PCP: Fayrene Helper, MD  Admit date: 11/10/2017 Discharge date: 11/16/2017  Admitted From: home 1. Disposition: home 2.   Follow up with PCP in 1-2 weeks 3. Please obtain BMP/CBC in one week   Home Health:yes Equipment/Devicesnone Discharge Condition:stable CODE STATUS:full Diet recommendation cardiac Brief/Interim Summary::67 year old female admitted with severe hyponatremia on 11/10/2017. Patient was admitted to the ICU under P CCM transfer to [facility] service 11/12/2017. Review of the records show that patient's family called 911 due to change in mental status. She received a dose of Haldol en route ED. Patient lives at home with this with her family. Patient in addition to that has a history of hypertension, history of VP shunt for hydrocephalus, asthma, anxiety, delirium, agitation, and paranoia and visual hallucinations. Patient has not followed up with a neurosurgeon as she does not have a way to get to that appointment or has no one to take her. She also has a past medical history significant for [facility] spotted fever in 2010 stressed induced cardiomyopathy in 2011 and hydrocephalus in 2010. The shunt was placed in 2010  11/13/2017-patient resting..  1/21-resting in bed.appears tachypneic.lasix one dose given.stat chest xray-no acute changes.    Discharge Diagnoses:  Active Problems:   Hyponatremia   Pressure injury of skin  1]severe symptomatic hyponatremia with sodium of 111 upon admission-today her sodium has come up to 131.Because of the hyponatremia is thought to be secondary to hydrochlorothiazide. I have added hydrochlorothiazide as an allergic list.will discharge on lasix 20 mg daily.  2]hypertension continue metoprolol, amlodipine.  3] stage 2 sacral pressure ulcer present on admit.     Discharge Instructions follow up[ with pcp   Allergies as  of 11/16/2017      Reactions   Hydrochlorothiazide    CAUSES LIFE THREATENING HYPONATREMIA NA 111   Penicillins    Made her sleep all day Has patient had a PCN reaction causing immediate rash, facial/tongue/throat swelling, SOB or lightheadedness with hypotension YES Has patient had a PCN reaction causing severe rash involving mucus membranes or skin necrosis: No Has patient had a PCN reaction that required hospitalization: No Has patient had a PCN reaction occurring within the last 10 years: No If all of the above answers are "NO", then may proceed with Cephalosporin use.      Medication List    STOP taking these medications   hydrochlorothiazide 25 MG tablet Commonly known as:  HYDRODIURIL   levofloxacin 750 MG tablet Commonly known as:  LEVAQUIN   mirtazapine 15 MG tablet Commonly known as:  REMERON     TAKE these medications   amLODipine 10 MG tablet Commonly known as:  NORVASC TAKE ONE TABLET BY MOUTH DAILY   ASPIRIN LOW DOSE 81 MG EC tablet Generic drug:  aspirin Take 81 mg by mouth daily.   BREO ELLIPTA 100-25 MCG/INH Aepb Generic drug:  fluticasone furoate-vilanterol Inhale 1 puff into the lungs daily.   furosemide 20 MG tablet Commonly known as:  LASIX Take 1 tablet (20 mg total) by mouth daily.   INCRUSE ELLIPTA 62.5 MCG/INH Aepb Generic drug:  umeclidinium bromide Inhale 1 puff into the lungs daily.   lisinopril 5 MG tablet Commonly known as:  PRINIVIL,ZESTRIL Take 1 tablet (5 mg total) by mouth daily. What changed:    medication strength  how much to take   lovastatin 40 MG tablet Commonly known as:  MEVACOR Take 1 tablet (40 mg  total) by mouth at bedtime. What changed:  See the new instructions.   metoprolol tartrate 25 MG tablet Commonly known as:  LOPRESSOR Take 1 tablet (25 mg total) by mouth 2 (two) times daily.   potassium chloride 10 MEQ tablet Commonly known as:  K-DUR Take 1 tablet (10 mEq total) by mouth daily.      Follow-up  Information    Fayrene Helper, MD Follow up.   Specialty:  Family Medicine Contact information: [address]  [place] [address] [phone]          Allergies  Allergen Reactions  . Hydrochlorothiazide     CAUSES LIFE THREATENING HYPONATREMIA NA 111  . Penicillins     Made her sleep all day Has patient had a PCN reaction causing immediate rash, facial/tongue/throat swelling, SOB or lightheadedness with hypotension YES Has patient had a PCN reaction causing severe rash involving mucus membranes or skin necrosis: No Has patient had a PCN reaction that required hospitalization: No Has patient had a PCN reaction occurring within the last 10 years: No If all of the above answers are "NO", then may proceed with Cephalosporin use.     Consultations:  none   Procedures/Studies: Dg Chest 1 View  Result Date: 11/15/2017 CLINICAL DATA:  Hypoxia EXAM: CHEST 1 VIEW COMPARISON:  11/11/2017 FINDINGS: Cardiac shadow is stable. Aortic calcifications are again seen. Left-sided shunt catheter is again identified and stable. The lungs are well aerated bilaterally. No acute bony abnormality is seen. IMPRESSION: No acute abnormality noted. Electronically Signed   By: Inez Catalina M.D.   On: 11/15/2017 08:02   Dg Chest 2 View  Result Date: 11/10/2017 CLINICAL DATA:  66 year old female with altered mental status. EXAM: CHEST  2 VIEW COMPARISON:  None. FINDINGS: Left IJ central venous line with tip over the spine just below the diaphragm. There is mild emphysematous changes of the lungs. No focal consolidation, pleural effusion, or pneumothorax. The cardiac silhouette is within normal limits. No acute osseous pathology. IMPRESSION: No active cardiopulmonary disease. Electronically Signed   By: Anner Crete M.D.   On: 11/10/2017 04:33   Ct Head Wo Contrast  Result Date: 11/10/2017 CLINICAL DATA:  67 y/o F; visual hallucinations, agitation, increasing paranoia, recent  diagnosis of delirium. EXAM: CT HEAD WITHOUT CONTRAST TECHNIQUE: Contiguous axial images were obtained from the base of the skull through the vertex without intravenous contrast. COMPARISON:  12/26/2013 CT head FINDINGS: Brain: No evidence of acute infarction, hemorrhage, hydrocephalus, extra-axial collection or mass lesion/mass effect. Left parietal approach ventriculostomy catheter with tip and right lateral ventricle near foramen of Monro, stable. Stable ventricle size. Stable chronic infarct of corpus callosum. Interval progression of moderate chronic microvascular ischemic changes and mild parenchymal volume loss of the brain. Vascular: No hyperdense vessel or unexpected calcification. Skull: Normal. Negative for fracture or focal lesion. Sinuses/Orbits: Small fluid levels in the maxillary sinuses. Otherwise negative. Other: None. IMPRESSION: 1. No acute intracranial abnormality identified. 2. Progression of moderate chronic microvascular ischemic changes. Stable chronic infarct of corpus callosum. 3. Stable position of left parietal approach ventriculostomy catheter. Stable ventricle size. 4. Fluid levels in maxillary sinus may represent acute sinusitis. Electronically Signed   By: Kristine Garbe M.D.   On: 11/10/2017 04:52   Dg Chest Port 1 View  Result Date: 11/11/2017 CLINICAL DATA:  Respiratory failure EXAM: PORTABLE CHEST 1 VIEW COMPARISON:  11/10/2017 FINDINGS: Heart size and vascularity normal. Lungs remain clear without infiltrate or effusion. Shunt tubing overlies the  left chest. IMPRESSION: No active disease. Electronically Signed   By: Franchot Gallo M.D.   On: 11/11/2017 09:09   (Echo, Carotid, EGD, Colonoscopy, ERCP)    Subjective:   Discharge Exam: Vitals:   11/15/17 2050 11/16/17 0628  BP: (!) 146/57 (!) 119/58  Pulse: 97 87  Resp: (!) 22   Temp: 99 F (37.2 C) 99.1 F (37.3 C)  SpO2: 94% 95%   Vitals:   11/15/17 0512 11/15/17 1449 11/15/17 2050 11/16/17 0628   BP: (!) 146/67 (!) 144/56 (!) 146/57 (!) 119/58  Pulse: 83 94 97 87  Resp: 18 20 (!) 22   Temp: 98.6 F (37 C) 98.1 F (36.7 C) 99 F (37.2 C) 99.1 F (37.3 C)  TempSrc: Oral Oral Oral Oral  SpO2: 97% 94% 94% 95%  Weight: 67.6 kg (149 lb 0.5 oz)     Height:        General: Pt is alert, awake, not in acute distress Cardiovascular: RRR, S1/S2 +, no rubs, no gallops Respiratory: CTA bilaterally, no wheezing, no rhonchi Abdominal: Soft, NT, ND, bowel sounds + Extremities: no edema, no cyanosis    The results of significant diagnostics from this hospitalization (including imaging, microbiology, ancillary and laboratory) are listed below for reference.     Microbiology: Recent Results (from the past 240 hour(s))  MRSA PCR Screening     Status: None   Collection Time: 11/10/17  6:53 PM  Result Value Ref Range Status   MRSA by PCR NEGATIVE NEGATIVE Final    Comment:        The GeneXpert MRSA Assay (FDA approved for NASAL specimens only), is one component of a comprehensive MRSA colonization surveillance program. It is not intended to diagnose MRSA infection nor to guide or monitor treatment for MRSA infections.      Labs: BNP (last 3 results) No results for input(s): BNP in the last 8760 hours. Basic Metabolic Panel: Recent Labs  Lab 11/11/17 0344  11/11/17 2204 11/12/17 0604 11/13/17 0312 11/14/17 0622 11/15/17 0806  NA 124*   < > 129* 130* 131* 133* 130*  K 4.0   < > 4.2 4.2 4.2 4.0 4.3  CL 96*   < > 99* 100* 103 103 99*  CO2 21*   < > 19* 23 26 27 24   GLUCOSE 97   < > 147* 145* 96 92 98  BUN 10   < > 8 9 8 6 9   CREATININE 0.53   < > 0.53 0.47 0.51 0.49 0.52  CALCIUM 7.7*   < > 8.1* 8.2* 8.1* 7.8* 8.5*  MG 1.5*  --   --   --  1.9  --   --   PHOS 2.3*  --   --   --  1.7*  --   --    < > = values in this interval not displayed.   Liver Function Tests: Recent Labs  Lab 11/10/17 0349  AST 34  ALT 22  ALKPHOS 51  BILITOT 1.7*  PROT 5.0*  ALBUMIN  2.7*   No results for input(s): LIPASE, AMYLASE in the last 168 hours. Recent Labs  Lab 11/10/17 0349  AMMONIA 15   CBC: Recent Labs  Lab 11/10/17 0349 11/11/17 0440 11/12/17 0315 11/13/17 0312 11/15/17 0806  WBC 7.4 4.5 10.6* 8.0 5.8  NEUTROABS 6.0  --   --   --   --   HGB 11.8* 9.7* 12.0 10.8* 13.1  HCT 31.3* 26.3* 33.1* 30.8* 36.9  MCV  92.3 95.6 97.1 99.7 98.9  PLT 326 337 530* 420* 321   Cardiac Enzymes: Recent Labs  Lab 11/10/17 0811 11/10/17 1256  TROPONINI 0.26* 0.17*   BNP: Invalid input(s): POCBNP CBG: Recent Labs  Lab 11/11/17 0048 11/11/17 1953 11/12/17 0236  GLUCAP 71 124* 163*   D-Dimer No results for input(s): DDIMER in the last 72 hours. Hgb A1c No results for input(s): HGBA1C in the last 72 hours. Lipid Profile No results for input(s): CHOL, HDL, LDLCALC, TRIG, CHOLHDL, LDLDIRECT in the last 72 hours. Thyroid function studies No results for input(s): TSH, T4TOTAL, T3FREE, THYROIDAB in the last 72 hours.  Invalid input(s): FREET3 Anemia work up No results for input(s): VITAMINB12, FOLATE, FERRITIN, TIBC, IRON, RETICCTPCT in the last 72 hours. Urinalysis    Component Value Date/Time   COLORURINE YELLOW 11/10/2017 0438   APPEARANCEUR CLEAR 11/10/2017 0438   LABSPEC 1.011 11/10/2017 0438   PHURINE 6.0 11/10/2017 0438   GLUCOSEU 50 (A) 11/10/2017 0438   HGBUR SMALL (A) 11/10/2017 0438   BILIRUBINUR NEGATIVE 11/10/2017 0438   BILIRUBINUR neg 07/28/2013 0939   KETONESUR 5 (A) 11/10/2017 0438   PROTEINUR NEGATIVE 11/10/2017 0438   UROBILINOGEN 0.2 07/28/2013 0939   UROBILINOGEN 0.2 02/20/2011 1118   NITRITE NEGATIVE 11/10/2017 0438   LEUKOCYTESUR NEGATIVE 11/10/2017 0438   Sepsis Labs Invalid input(s): PROCALCITONIN,  WBC,  LACTICIDVEN Microbiology Recent Results (from the past 240 hour(s))  MRSA PCR Screening     Status: None   Collection Time: 11/10/17  6:53 PM  Result Value Ref Range Status   MRSA by PCR NEGATIVE NEGATIVE Final     Comment:        The GeneXpert MRSA Assay (FDA approved for NASAL specimens only), is one component of a comprehensive MRSA colonization surveillance program. It is not intended to diagnose MRSA infection nor to guide or monitor treatment for MRSA infections.      Time coordinating discharge: Over 30 minutes  SIGNED:   Georgette Shell, MD  [facility] 11/16/2017, 8:47 AM Pager   If 7PM-7AM, please contact night-coverage [email or web address] Password [email or web address]

## 2017-11-17 ENCOUNTER — Telehealth: Payer: Self-pay | Admitting: Family Medicine

## 2017-11-17 NOTE — Telephone Encounter (Signed)
-----   Message from Eual Fines, LPN sent at 4/35/3912  2:10 PM EST ----- Regarding: FW: pls do transditional call, aim for 1 to 2 week max f/u please   ----- Message ----- From: Fayrene Helper, MD Sent: 11/16/2017   2:50 PM To: Nelva Bush Hudy, LPN Subject: pls do transditional call, aim for 1 to 2 we#

## 2017-11-17 NOTE — Telephone Encounter (Signed)
Called patient for transitional. No answer, left message. Patient was d/c 1/22, call must be completed by 1/24

## 2017-11-18 NOTE — Telephone Encounter (Signed)
Called patient regarding message below. No answer, left generic message for patient to return call.   

## 2017-11-24 ENCOUNTER — Inpatient Hospital Stay (HOSPITAL_COMMUNITY)
Admission: EM | Admit: 2017-11-24 | Discharge: 2017-12-06 | DRG: 207 | Disposition: A | Discharge status: Home Health | Payer: Medicare HMO | Attending: Internal Medicine | Admitting: Internal Medicine

## 2017-11-24 ENCOUNTER — Emergency Department (HOSPITAL_COMMUNITY): Payer: Medicare HMO

## 2017-11-24 ENCOUNTER — Other Ambulatory Visit: Payer: Self-pay

## 2017-11-24 ENCOUNTER — Encounter (HOSPITAL_COMMUNITY): Payer: Self-pay

## 2017-11-24 DIAGNOSIS — E861 Hypovolemia: Secondary | ICD-10-CM | POA: Diagnosis not present

## 2017-11-24 DIAGNOSIS — J9621 Acute and chronic respiratory failure with hypoxia: Secondary | ICD-10-CM | POA: Diagnosis not present

## 2017-11-24 DIAGNOSIS — R4182 Altered mental status, unspecified: Secondary | ICD-10-CM

## 2017-11-24 DIAGNOSIS — R400 Somnolence: Secondary | ICD-10-CM | POA: Diagnosis not present

## 2017-11-24 DIAGNOSIS — Z9911 Dependence on respirator [ventilator] status: Secondary | ICD-10-CM | POA: Diagnosis not present

## 2017-11-24 DIAGNOSIS — Z90710 Acquired absence of both cervix and uterus: Secondary | ICD-10-CM | POA: Diagnosis not present

## 2017-11-24 DIAGNOSIS — R739 Hyperglycemia, unspecified: Secondary | ICD-10-CM | POA: Diagnosis present

## 2017-11-24 DIAGNOSIS — J9602 Acute respiratory failure with hypercapnia: Secondary | ICD-10-CM

## 2017-11-24 DIAGNOSIS — M7989 Other specified soft tissue disorders: Secondary | ICD-10-CM | POA: Diagnosis present

## 2017-11-24 DIAGNOSIS — Z978 Presence of other specified devices: Secondary | ICD-10-CM

## 2017-11-24 DIAGNOSIS — Z7951 Long term (current) use of inhaled steroids: Secondary | ICD-10-CM

## 2017-11-24 DIAGNOSIS — I361 Nonrheumatic tricuspid (valve) insufficiency: Secondary | ICD-10-CM | POA: Diagnosis not present

## 2017-11-24 DIAGNOSIS — R0602 Shortness of breath: Secondary | ICD-10-CM | POA: Diagnosis present

## 2017-11-24 DIAGNOSIS — E874 Mixed disorder of acid-base balance: Secondary | ICD-10-CM | POA: Diagnosis present

## 2017-11-24 DIAGNOSIS — Z0189 Encounter for other specified special examinations: Secondary | ICD-10-CM

## 2017-11-24 DIAGNOSIS — J449 Chronic obstructive pulmonary disease, unspecified: Secondary | ICD-10-CM | POA: Diagnosis present

## 2017-11-24 DIAGNOSIS — B974 Respiratory syncytial virus as the cause of diseases classified elsewhere: Secondary | ICD-10-CM | POA: Diagnosis present

## 2017-11-24 DIAGNOSIS — I5033 Acute on chronic diastolic (congestive) heart failure: Secondary | ICD-10-CM | POA: Diagnosis present

## 2017-11-24 DIAGNOSIS — L8990 Pressure ulcer of unspecified site, unspecified stage: Secondary | ICD-10-CM | POA: Diagnosis present

## 2017-11-24 DIAGNOSIS — I10 Essential (primary) hypertension: Secondary | ICD-10-CM | POA: Diagnosis not present

## 2017-11-24 DIAGNOSIS — F419 Anxiety disorder, unspecified: Secondary | ICD-10-CM | POA: Diagnosis present

## 2017-11-24 DIAGNOSIS — Z9049 Acquired absence of other specified parts of digestive tract: Secondary | ICD-10-CM | POA: Diagnosis not present

## 2017-11-24 DIAGNOSIS — E785 Hyperlipidemia, unspecified: Secondary | ICD-10-CM | POA: Diagnosis present

## 2017-11-24 DIAGNOSIS — I110 Hypertensive heart disease with heart failure: Secondary | ICD-10-CM | POA: Diagnosis present

## 2017-11-24 DIAGNOSIS — G9341 Metabolic encephalopathy: Secondary | ICD-10-CM | POA: Diagnosis present

## 2017-11-24 DIAGNOSIS — Z982 Presence of cerebrospinal fluid drainage device: Secondary | ICD-10-CM | POA: Diagnosis not present

## 2017-11-24 DIAGNOSIS — Z452 Encounter for adjustment and management of vascular access device: Secondary | ICD-10-CM | POA: Diagnosis not present

## 2017-11-24 DIAGNOSIS — Z888 Allergy status to other drugs, medicaments and biological substances status: Secondary | ICD-10-CM

## 2017-11-24 DIAGNOSIS — J989 Respiratory disorder, unspecified: Secondary | ICD-10-CM | POA: Diagnosis not present

## 2017-11-24 DIAGNOSIS — Z66 Do not resuscitate: Secondary | ICD-10-CM | POA: Diagnosis present

## 2017-11-24 DIAGNOSIS — R54 Age-related physical debility: Secondary | ICD-10-CM | POA: Diagnosis present

## 2017-11-24 DIAGNOSIS — J9600 Acute respiratory failure, unspecified whether with hypoxia or hypercapnia: Secondary | ICD-10-CM | POA: Diagnosis present

## 2017-11-24 DIAGNOSIS — J441 Chronic obstructive pulmonary disease with (acute) exacerbation: Secondary | ICD-10-CM | POA: Diagnosis present

## 2017-11-24 DIAGNOSIS — D539 Nutritional anemia, unspecified: Secondary | ICD-10-CM | POA: Diagnosis present

## 2017-11-24 DIAGNOSIS — Z87891 Personal history of nicotine dependence: Secondary | ICD-10-CM | POA: Diagnosis not present

## 2017-11-24 DIAGNOSIS — Z880 Allergy status to penicillin: Secondary | ICD-10-CM

## 2017-11-24 DIAGNOSIS — L89152 Pressure ulcer of sacral region, stage 2: Secondary | ICD-10-CM | POA: Diagnosis present

## 2017-11-24 DIAGNOSIS — J9622 Acute and chronic respiratory failure with hypercapnia: Secondary | ICD-10-CM | POA: Diagnosis present

## 2017-11-24 DIAGNOSIS — J9601 Acute respiratory failure with hypoxia: Secondary | ICD-10-CM | POA: Diagnosis not present

## 2017-11-24 DIAGNOSIS — Z79899 Other long term (current) drug therapy: Secondary | ICD-10-CM

## 2017-11-24 DIAGNOSIS — E876 Hypokalemia: Secondary | ICD-10-CM | POA: Diagnosis present

## 2017-11-24 DIAGNOSIS — Z7982 Long term (current) use of aspirin: Secondary | ICD-10-CM

## 2017-11-24 DIAGNOSIS — R64 Cachexia: Secondary | ICD-10-CM | POA: Diagnosis present

## 2017-11-24 DIAGNOSIS — R579 Shock, unspecified: Secondary | ICD-10-CM | POA: Diagnosis not present

## 2017-11-24 LAB — COMPREHENSIVE METABOLIC PANEL
ALT: 21 U/L (ref 14–54)
ANION GAP: 10 (ref 5–15)
AST: 38 U/L (ref 15–41)
Albumin: 3.3 g/dL — ABNORMAL LOW (ref 3.5–5.0)
Alkaline Phosphatase: 74 U/L (ref 38–126)
BUN: 13 mg/dL (ref 6–20)
CHLORIDE: 97 mmol/L — AB (ref 101–111)
CO2: 33 mmol/L — ABNORMAL HIGH (ref 22–32)
Calcium: 9 mg/dL (ref 8.9–10.3)
Creatinine, Ser: 0.58 mg/dL (ref 0.44–1.00)
Glucose, Bld: 179 mg/dL — ABNORMAL HIGH (ref 65–99)
POTASSIUM: 3 mmol/L — AB (ref 3.5–5.1)
Sodium: 140 mmol/L (ref 135–145)
TOTAL PROTEIN: 6.5 g/dL (ref 6.5–8.1)
Total Bilirubin: 0.7 mg/dL (ref 0.3–1.2)

## 2017-11-24 LAB — BLOOD GAS, ARTERIAL
ACID-BASE EXCESS: 8.7 mmol/L — AB (ref 0.0–2.0)
Acid-Base Excess: 7.3 mmol/L — ABNORMAL HIGH (ref 0.0–2.0)
BICARBONATE: 35.8 mmol/L — AB (ref 20.0–28.0)
Bicarbonate: 35.4 mmol/L — ABNORMAL HIGH (ref 20.0–28.0)
DELIVERY SYSTEMS: POSITIVE
DRAWN BY: 441261
Drawn by: 441261
Expiratory PAP: 5
FIO2: 40
Inspiratory PAP: 12
O2 CONTENT: 6 L/min
O2 SAT: 97.8 %
O2 Saturation: 96.7 %
PATIENT TEMPERATURE: 98.6
PCO2 ART: 74.3 mmHg — AB (ref 32.0–48.0)
PCO2 ART: 66.8 mmHg — AB (ref 32.0–48.0)
PH ART: 7.299 — AB (ref 7.350–7.450)
PH ART: 7.348 — AB (ref 7.350–7.450)
PO2 ART: 119 mmHg — AB (ref 83.0–108.0)
PO2 ART: 94.6 mmHg (ref 83.0–108.0)
Patient temperature: 98.6
RATE: 10 resp/min

## 2017-11-24 LAB — I-STAT TROPONIN, ED: Troponin i, poc: 0.01 ng/mL (ref 0.00–0.08)

## 2017-11-24 LAB — PROTIME-INR
INR: 1
Prothrombin Time: 13.1 seconds (ref 11.4–15.2)

## 2017-11-24 LAB — I-STAT CG4 LACTIC ACID, ED
LACTIC ACID, VENOUS: 2.22 mmol/L — AB (ref 0.5–1.9)
Lactic Acid, Venous: 1.61 mmol/L (ref 0.5–1.9)

## 2017-11-24 LAB — CBC WITH DIFFERENTIAL/PLATELET
BASOS ABS: 0 10*3/uL (ref 0.0–0.1)
BASOS PCT: 0 %
Eosinophils Absolute: 0 10*3/uL (ref 0.0–0.7)
Eosinophils Relative: 0 %
HCT: 34.2 % — ABNORMAL LOW (ref 36.0–46.0)
Hemoglobin: 11.4 g/dL — ABNORMAL LOW (ref 12.0–15.0)
LYMPHS PCT: 10 %
Lymphs Abs: 0.7 10*3/uL (ref 0.7–4.0)
MCH: 34.9 pg — ABNORMAL HIGH (ref 26.0–34.0)
MCHC: 33.3 g/dL (ref 30.0–36.0)
MCV: 104.6 fL — AB (ref 78.0–100.0)
Monocytes Absolute: 0.4 10*3/uL (ref 0.1–1.0)
Monocytes Relative: 5 %
NEUTROS ABS: 6.4 10*3/uL (ref 1.7–7.7)
Neutrophils Relative %: 85 %
PLATELETS: 417 10*3/uL — AB (ref 150–400)
RBC: 3.27 MIL/uL — AB (ref 3.87–5.11)
RDW: 13.7 % (ref 11.5–15.5)
WBC: 7.4 10*3/uL (ref 4.0–10.5)

## 2017-11-24 LAB — PROCALCITONIN: PROCALCITONIN: 0.3 ng/mL

## 2017-11-24 LAB — TROPONIN I: Troponin I: <0.03 ng/mL (ref <0.03)

## 2017-11-24 LAB — BRAIN NATRIURETIC PEPTIDE: B NATRIURETIC PEPTIDE 5: 380.2 pg/mL — AB (ref 0.0–100.0)

## 2017-11-24 MED ORDER — SODIUM CHLORIDE 0.9 % IV BOLUS (SEPSIS)
500.0000 mL | Freq: Once | INTRAVENOUS | Status: AC
  Administered 2017-11-24: 500 mL via INTRAVENOUS

## 2017-11-24 MED ORDER — POTASSIUM CHLORIDE 10 MEQ/100ML IV SOLN
10.0000 meq | Freq: Once | INTRAVENOUS | Status: AC
  Administered 2017-11-24: 10 meq via INTRAVENOUS
  Filled 2017-11-24: qty 100

## 2017-11-24 MED ORDER — METHYLPREDNISOLONE SODIUM SUCC 125 MG IJ SOLR
80.0000 mg | Freq: Four times a day (QID) | INTRAMUSCULAR | Status: DC
  Administered 2017-11-25 – 2017-11-27 (×6): 80 mg via INTRAVENOUS
  Filled 2017-11-24 (×6): qty 2

## 2017-11-24 MED ORDER — POTASSIUM CHLORIDE ER 10 MEQ PO TBCR: 10.0000 meq | EXTENDED_RELEASE_TABLET | Freq: Every day | ORAL | Status: DC

## 2017-11-24 MED ORDER — LORAZEPAM 2 MG/ML IJ SOLN
0.5000 mg | INTRAMUSCULAR | Status: DC | PRN
  Administered 2017-11-25 (×2): 0.5 mg via INTRAVENOUS
  Filled 2017-11-24 (×3): qty 1

## 2017-11-24 MED ORDER — ALBUTEROL SULFATE (2.5 MG/3ML) 0.083% IN NEBU: 2.5000 mg | INHALATION_SOLUTION | RESPIRATORY_TRACT | Status: DC | PRN

## 2017-11-24 MED ORDER — ACETAMINOPHEN 650 MG RE SUPP: 650.0000 mg | Freq: Four times a day (QID) | RECTAL | Status: DC | PRN

## 2017-11-24 MED ORDER — METOPROLOL TARTRATE 25 MG PO TABS
25.0000 mg | ORAL_TABLET | Freq: Two times a day (BID) | ORAL | Status: DC
  Filled 2017-11-24: qty 1

## 2017-11-24 MED ORDER — IPRATROPIUM BROMIDE 0.02 % IN SOLN
0.5000 mg | Freq: Four times a day (QID) | RESPIRATORY_TRACT | Status: DC
  Administered 2017-11-25 (×2): 0.5 mg via RESPIRATORY_TRACT
  Filled 2017-11-24 (×2): qty 2.5

## 2017-11-24 MED ORDER — IPRATROPIUM-ALBUTEROL 0.5-2.5 (3) MG/3ML IN SOLN
3.0000 mL | Freq: Once | RESPIRATORY_TRACT | Status: AC
  Administered 2017-11-24: 3 mL via RESPIRATORY_TRACT
  Filled 2017-11-24: qty 3

## 2017-11-24 MED ORDER — POTASSIUM CHLORIDE CRYS ER 10 MEQ PO TBCR
10.0000 meq | EXTENDED_RELEASE_TABLET | Freq: Every day | ORAL | Status: DC
  Filled 2017-11-24: qty 1

## 2017-11-24 MED ORDER — ACETAMINOPHEN 325 MG PO TABS: 650.0000 mg | ORAL_TABLET | Freq: Four times a day (QID) | ORAL | Status: DC | PRN

## 2017-11-24 MED ORDER — ALBUTEROL SULFATE (2.5 MG/3ML) 0.083% IN NEBU
2.5000 mg | INHALATION_SOLUTION | RESPIRATORY_TRACT | Status: DC | PRN
  Filled 2017-11-24: qty 3

## 2017-11-24 MED ORDER — SODIUM CHLORIDE 0.9 % IV SOLN
250.0000 mL | INTRAVENOUS | Status: DC | PRN
  Administered 2017-11-25: 250 mL via INTRAVENOUS

## 2017-11-24 MED ORDER — AMLODIPINE BESYLATE 10 MG PO TABS: 10.0000 mg | ORAL_TABLET | Freq: Every day | ORAL | Status: DC

## 2017-11-24 MED ORDER — ARFORMOTEROL TARTRATE 15 MCG/2ML IN NEBU
15.0000 ug | INHALATION_SOLUTION | Freq: Two times a day (BID) | RESPIRATORY_TRACT | Status: DC
  Administered 2017-11-24 – 2017-11-25 (×2): 15 ug via RESPIRATORY_TRACT
  Filled 2017-11-24 (×2): qty 2

## 2017-11-24 MED ORDER — SODIUM CHLORIDE 0.9% FLUSH
3.0000 mL | Freq: Two times a day (BID) | INTRAVENOUS | Status: DC
  Administered 2017-11-25: 3 mL via INTRAVENOUS

## 2017-11-24 MED ORDER — DEXTROSE 5 % IV SOLN
500.0000 mg | Freq: Every day | INTRAVENOUS | Status: DC
  Administered 2017-11-25 – 2017-11-26 (×3): 500 mg via INTRAVENOUS
  Filled 2017-11-24 (×3): qty 500

## 2017-11-24 MED ORDER — LISINOPRIL 2.5 MG PO TABS: 5.0000 mg | ORAL_TABLET | Freq: Every day | ORAL | Status: DC

## 2017-11-24 MED ORDER — ONDANSETRON HCL 4 MG PO TABS: 4.0000 mg | ORAL_TABLET | Freq: Four times a day (QID) | ORAL | Status: DC | PRN

## 2017-11-24 MED ORDER — ONDANSETRON HCL 4 MG/2ML IJ SOLN: 4.0000 mg | Freq: Four times a day (QID) | INTRAMUSCULAR | Status: DC | PRN

## 2017-11-24 MED ORDER — LORAZEPAM 2 MG/ML IJ SOLN
0.5000 mg | Freq: Once | INTRAMUSCULAR | Status: AC
  Administered 2017-11-24: 0.5 mg via INTRAVENOUS
  Filled 2017-11-24: qty 1

## 2017-11-24 MED ORDER — PRAVASTATIN SODIUM 20 MG PO TABS: 40.0000 mg | ORAL_TABLET | Freq: Every day | ORAL | Status: DC

## 2017-11-24 MED ORDER — ENOXAPARIN SODIUM 40 MG/0.4ML ~~LOC~~ SOLN
40.0000 mg | Freq: Every day | SUBCUTANEOUS | Status: DC
  Administered 2017-11-25 – 2017-12-06 (×13): 40 mg via SUBCUTANEOUS
  Filled 2017-11-24 (×13): qty 0.4

## 2017-11-24 MED ORDER — SODIUM CHLORIDE 0.9% FLUSH: 3.0000 mL | INTRAVENOUS | Status: DC | PRN

## 2017-11-24 MED ORDER — ASPIRIN EC 81 MG PO TBEC
81.0000 mg | DELAYED_RELEASE_TABLET | Freq: Every day | ORAL | Status: DC
  Filled 2017-11-24: qty 1

## 2017-11-24 MED ORDER — IPRATROPIUM-ALBUTEROL 0.5-2.5 (3) MG/3ML IN SOLN
3.0000 mL | Freq: Four times a day (QID) | RESPIRATORY_TRACT | Status: DC
  Administered 2017-11-24: 3 mL via RESPIRATORY_TRACT
  Filled 2017-11-24: qty 3

## 2017-11-24 MED ORDER — BUDESONIDE 0.5 MG/2ML IN SUSP
0.5000 mg | Freq: Two times a day (BID) | RESPIRATORY_TRACT | Status: DC
  Administered 2017-11-24 – 2017-12-06 (×24): 0.5 mg via RESPIRATORY_TRACT
  Filled 2017-11-24 (×25): qty 2

## 2017-11-24 NOTE — H&P (Signed)
History and Physical   Angela Duncan:[id number] DOB: 1951/05/15 DOA: 11/24/2017  Referring MD/NP/PA: Francia Greaves, MD, [place] PCP: Patient, No Pcp Per Outpatient Specialists: Pulmonology, [place]  Patient coming from: Home  Chief Complaint: Dyspnea  HPI: Angela Duncan is a 67 y.o. female with a history of asthma, COPD, HTN, hydrocephalus s/p VP shunt 2010, anxiety, stage 2 sacral pressure ulcer, and recent admission for hyponatremia thought to be due to HCTZ causing AMS discharged on 1/22 who presented to the ED by EMS for dyspnea. She grew gradually increasingly short of breath at rest with associated nonproductive cough, mid chest pain worse with cough, but no fever. She began to grow more confused as well. EMS administered oxygen, solumedrol 125mg  and a breathing treatment en route with mild improvement, though she continued to have wheezing and appeared to be in respiratory distress. ABG showed respiratory acidosis 7.299/74.3/119 on supplemental O2. Further nebulizer treatments given, pt placed on BiPAP. CXR showed emphysema without infiltrate or cardiac process. Sodium notably normalized, K 3.0, Cr normal, glucose 179. Lactic acid 2.22. Cultures drawn though no antibiotics given as infection not suspected. BNP elevated to 380, ECG showed NSR with artifact, precordial T wave flattening new from prior, and initial troponin 0.01. WBC normal at 7.4, hgb near baseline at 11.4. Ativan was given to accommodate BiPAP. Hospitalists consulted for admission. Repeat ABG requested which improved to 7.348/66.8/94.6.  Review of Systems: Denies fevers, chills, palpitations, HA, vision changes, abd pain, N/V/D, discharge from wounds, and per HPI. All others reviewed and are negative.   Past Medical History:  Diagnosis Date  . Anxiety    manged by Dr. Luan Pulling   . Asthma 1993   managed by Dr. Luan Pulling   . Depression    managed by Dr. Luan Pulling   . Hydrocephalus 2010  . Hypertension   . [place]  spotted fever 2010  . Stress-induced cardiomyopathy 6/11   Past Surgical History:  Procedure Laterality Date  . ABDOMINAL HYSTERECTOMY    . BRAIN SURGERY    . CARDIAC CATHETERIZATION    . CHOLECYSTECTOMY    . cholecystectomy : stones    . partial hysterectomy - due to bleeding issues    . 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 TONSILLECTOMY     - Remote smoker, >40 years ago. No recent EtOH or illicit drugs. Lives with brother, formerly lived in [place], [place]. Does not have PCP in the area.   reports that  has never smoked. she has never used smokeless tobacco. She reports that she drinks alcohol. She reports that she does not use drugs. Allergies  Allergen Reactions  . Hydrochlorothiazide     CAUSES LIFE THREATENING HYPONATREMIA NA 111  . Penicillins     Made her sleep all day Has patient had a PCN reaction causing immediate rash, facial/tongue/throat swelling, SOB or lightheadedness with hypotension YES Has patient had a PCN reaction causing severe rash involving mucus membranes or skin necrosis: No Has patient had a PCN reaction that required hospitalization: No Has patient had a PCN reaction occurring within the last 10 years: No If all of the above answers are "NO", then may proceed with Cephalosporin use.    Family History  Problem Relation Age of Onset  . Diabetes Mother   . Hypertension Mother   . Thyroid cancer Mother   . Kidney failure Mother   . Hypertension Father   . Heart disease Brother        Stent in heart, also  has blocklage in neck   . Throat cancer Sister   . Hypertension Sister   . Hyperlipidemia Sister   . Thyroid disease Sister    - Family history otherwise reviewed and not pertinent.  Prior to Admission medications   Medication Sig Start Date End Date Taking? Authorizing Provider  amLODipine (NORVASC) 10 MG tablet TAKE ONE TABLET BY MOUTH DAILY 04/30/15  Yes Fayrene Helper, MD  aspirin (ASPIRIN LOW DOSE) 81 MG EC tablet Take 81 mg by mouth daily.      Yes [provider]  fluticasone furoate-vilanterol (BREO ELLIPTA) 100-25 MCG/INH AEPB Inhale 1 puff into the lungs daily.   Yes [provider]  furosemide (LASIX) 20 MG tablet Take 1 tablet (20 mg total) by mouth daily. 11/16/17 11/16/18 Yes Georgette Shell, MD  lisinopril (PRINIVIL,ZESTRIL) 5 MG tablet Take 1 tablet (5 mg total) by mouth daily. 11/16/17 11/16/18 Yes Georgette Shell, MD  metoprolol tartrate (LOPRESSOR) 25 MG tablet Take 1 tablet (25 mg total) by mouth 2 (two) times daily. 01/14/15  Yes Fayrene Helper, MD  potassium chloride (K-DUR) 10 MEQ tablet Take 1 tablet (10 mEq total) by mouth daily. 11/16/17  Yes Georgette Shell, MD  pravastatin (PRAVACHOL) 10 MG tablet TAKE 1 TABLETBY MOUTH DAILY AT 6 PM. 11/16/17  Yes [provider]  umeclidinium bromide (INCRUSE ELLIPTA) 62.5 MCG/INH AEPB Inhale 1 puff into the lungs daily.   Yes [provider]  lovastatin (MEVACOR) 40 MG tablet Take 1 tablet (40 mg total) by mouth at bedtime. Patient not taking: Reported on 11/24/2017 11/16/17   Georgette Shell, MD    Physical Exam: Vitals:   11/24/17 1717 11/24/17 1747 11/24/17 1830 11/24/17 1845  BP: (!) 136/118 103/64 132/60   Pulse: 88 92 93 87  Resp: (!) 29 (!) 24 (!) 29 (!) 25  SpO2: 97% 98% 97% 97%   Constitutional: 67 y.o. female drowsy, fighting BiPAP but following commands.  Eyes: Lids and conjunctivae normal, PERRL ENMT: Mucous membranes are moist. Posterior pharynx clear of any exudate or lesions. Poor dentition.  Neck: Normal, supple, no masses, no thyromegaly Respiratory: Labored tachypnea, tripod positioning on BiPAP, very diminished throughout. Cardiovascular: Regular rate and rhythm, no murmurs, rubs, or gallops. No carotid bruits. no JVD. 1+ LE edema. 2+ pedal pulses. Abdomen: Normoactive bowel sounds. No tenderness, non-distended, and no masses palpated. No hepatosplenomegaly. GU: No indwelling  catheter Musculoskeletal: No clubbing / cyanosis. No joint deformity upper and lower extremities. Good ROM, no contractures. Normal muscle tone.  Skin: Warm, dry. Shallow pressure ulcer with serous drainage on sacrum and very shallow wound on left lower lateral leg with bandaid covering, no discharge or odor.  Neurologic: Intermittent ptosis indicating she's drowsy, but redirectable, follows commands and has no focal neurological deficits.  Psychiatric: UTD  Labs on Admission: I have personally reviewed following labs and imaging studies  CBC: Recent Labs  Lab 11/24/17 1609  WBC 7.4  NEUTROABS 6.4  HGB 11.4*  HCT 34.2*  MCV 104.6*  PLT 557*   Basic Metabolic Panel: Recent Labs  Lab 11/24/17 1609  NA 140  K 3.0*  CL 97*  CO2 33*  GLUCOSE 179*  BUN 13  CREATININE 0.58  CALCIUM 9.0   GFR: Estimated Creatinine Clearance: 62.4 mL/min (by C-G formula based on SCr of 0.58 mg/dL). Liver Function Tests: Recent Labs  Lab 11/24/17 1609  AST 38  ALT 21  ALKPHOS 74  BILITOT 0.7  PROT 6.5  ALBUMIN  3.3*   No results for input(s): LIPASE, AMYLASE in the last 168 hours. No results for input(s): AMMONIA in the last 168 hours. Coagulation Profile: Recent Labs  Lab 11/24/17 1609  INR 1.00   Cardiac Enzymes: No results for input(s): CKTOTAL, CKMB, CKMBINDEX, TROPONINI in the last 168 hours. BNP (last 3 results) No results for input(s): PROBNP in the last 8760 hours. HbA1C: No results for input(s): HGBA1C in the last 72 hours. CBG: No results for input(s): GLUCAP in the last 168 hours. Lipid Profile: No results for input(s): CHOL, HDL, LDLCALC, TRIG, CHOLHDL, LDLDIRECT in the last 72 hours. Thyroid Function Tests: No results for input(s): TSH, T4TOTAL, FREET4, T3FREE, THYROIDAB in the last 72 hours. Anemia Panel: No results for input(s): VITAMINB12, FOLATE, FERRITIN, TIBC, IRON, RETICCTPCT in the last 72 hours. Urine analysis:    Component Value Date/Time   COLORURINE  YELLOW 11/10/2017 [place] 11/10/2017 0438   LABSPEC 1.011 11/10/2017 0438   PHURINE 6.0 11/10/2017 0438   GLUCOSEU 50 (A) 11/10/2017 0438   HGBUR SMALL (A) 11/10/2017 0438   BILIRUBINUR NEGATIVE 11/10/2017 0438   BILIRUBINUR neg 07/28/2013 0939   KETONESUR 5 (A) 11/10/2017 0438   PROTEINUR NEGATIVE 11/10/2017 0438   UROBILINOGEN 0.2 07/28/2013 0939   UROBILINOGEN 0.2 02/20/2011 1118   NITRITE NEGATIVE 11/10/2017 0438   LEUKOCYTESUR NEGATIVE 11/10/2017 0438    No results found for this or any previous visit (from the past 240 hour(s)).   Radiological Exams on Admission: Dg Chest Port 1 View  Result Date: 11/24/2017 CLINICAL DATA:  Shortness of breath. EXAM: PORTABLE CHEST 1 VIEW COMPARISON:  11/15/2017 and 11/10/2017 and 11/20/2013 FINDINGS: The lungs are hyperinflated with flattening of the diaphragm consistent with emphysema. No acute infiltrates or effusions. Heart size is normal. Aortic atherosclerosis. Pulmonary vascularity is within normal limits. IMPRESSION: 1. Emphysema. 2. Aortic atherosclerosis. 3. No acute abnormalities. Electronically Signed   By: Lorriane Shire M.D.   On: 11/24/2017 16:08    EKG: Independently reviewed. NSR with normal axis, precordial T wave inversions new from prior, QTc wnl.   Assessment/Plan Active Problems:   Hyperlipidemia LDL goal <100   Essential hypertension   COPD with asthma (HCC)   Altered mental status   Pressure injury of skin   Acute respiratory failure (HCC)   Acute hypoxic and hypercarbic respiratory failure: Due to COPD exacerbation. Lactic acid elevation due to respiratory distress.  - Continue BiPAP, ABG improved modestly, though pt appears to be getting more encephalopathic per her brother and very tight on my exam; I will ask PCCM to evaluate, confirmed full code.  - Treat COPD as below.   COPD exacerbation: Followed by Dr. Luan Pulling.  - Continue IV steroids - Bronchodilators scheduled and prn - Follow blood  cultures.   Chest pain: Likely due to COPD. ECG without ST segment deviation, though precordial leads with flattened T waves. Reviewed ECG w/Dr. Domenic Polite who feels this is a very nonspecific finding, recommended cycling enzymes and no further evaluation if they remain reassuring. Initial troponin negative. History of stress-induced cardiomyopathy.  - Cycle cardiac enzymes  Edema and elevated BNP: Though no crackles on exam, cardiomegaly or pulmonary edema on CXR.  - Check echocardiogram  Acute encephalopathy: Suspect due to respiratory distress. Previous admission had AMS due to hyponatremia, though this has resolved.  - Monitor, neuro checks - Treat w/BiPAP  Stage 2 sacral pressure ulcer: Does not appear infected at admission.  - WOC consulted - Offload as able  Hypokalemia:  - Replace and recheck in AM  Macrocytic anemia:  - Check anemia panel. Hgb near baseline.   History of hyponatremia: Resolved, thiazide was added to allergies list as this was presumed culprit  History of hydrocephalus s/p VP shunt: No neurological deficits.   DVT prophylaxis: Lovenox  Code Status: Full, confirmed on admission  Family Communication: Brother at bedside Disposition Plan: Admit to SDU Consults called: PCCM  Admission status: Inpatient    Vance Gather, MD [facility] [email or web address] Password [facility] 11/24/2017, 7:35 PM

## 2017-11-24 NOTE — Progress Notes (Addendum)
CSW met with pt's brother and pt's brother's request.  Pt's brother is Sullivan Lone at ph: [phone].  Pt is currently residing with pt's brother Gwyndolyn Saxon at:  [address] [place], [place].  Pt's brother states pt is sleeping in a child's bed due to pt's need for a hospital bed and pt is sleeping in pt's brother's grandson's bed.  Pt's brother is requesting assistance with a hospital bed, a shower chair, a wheelchair and a walker due to pt's medical needs in the home.  CSW consulted with ED CM who relayed pt's EDP's statement that pt is being admitted.  CM stated CM would place a CM handoff.  This Probation officer will leave a handoff for social work, as well and request the EDP place a CM consult for needed medical equipment.  CSW spoke with pt's brother who stated pt has "[place]".  CSW provided pt's brother with contact info for [employer] and [employer] so pt's brother can contact the appropriate agency to determine who can assist the pt in "switching" her Medicaid from [place] to [place].  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  Please reconsult if future social work needs arise.  CSW signing off, as social work intervention is no longer needed.  Alphonse Guild. Lemoine Goyne, LCSW, LCAS, CSI Clinical Social Worker Ph: [phone]

## 2017-11-24 NOTE — H&P (Signed)
.. ..  Name: Angela Duncan MRN: [id number] DOB: 1951/03/27    ADMISSION DATE:  11/24/2017 CONSULTATION DATE: 11/24/2017  REFERRING MD : [facility] hospitalist- GRUNZ MD  CHIEF COMPLAINT: Shortness of breath  BRIEF PATIENT DESCRIPTION: 67 year old female with a past medical history of asthma, COPD, hypertension, status post VP shunt in 2010 for hydrocephalus, anxiety, stage II sacral pressure ulcer presents with dyspnea found to be in acute exacerbation of COPD guarded on noninvasive positive pressure ventilation.  P CCM consulted Due to concern of mental status/somnolence while on BiPAP.   SIGNIFICANT EVENTS  Dyspnea requiring BiPAP  STUDIES:  Chest x-ray: Typical features of emphysema including: Hyperinflated lung fields, thin mediastinum, flattening of diaphragms.  There are no focal consolidations, and no signs of fluid overload.  ABG: Initial 7.29/74.3/119 improved to 7.34/60 6/94 Lactic acid 2.22   HISTORY OF PRESENT ILLNESS:  (Most of the history was acquired from the EMR, and other care providers)  67 year old female with past medical history of obstructive lung disease COPD (home medications include in [place], [place]), anxiety, depression, hydrocephalus status post VP shunt, hypertension, stress-induced cardiomyopathy, and a stage II sacral pressure ulcer presented on 11/24/2017 to ED with dyspnea which grew increasingly worse and was associated with a nonproductive cough, mild chest pain, but no fever.  EMS administered oxygen, Solu-Medrol 125 mg, and a breathing treatment in route patient had some mild improvement.  Pt continued on Duonebs, and received 0.5mg  Ativan to help her tolerate Bipap therapy. PCCM was consulted due to somnolence on NIPPV.  PAST MEDICAL HISTORY :   has a past medical history of Anxiety, Asthma (1993), Depression, Hydrocephalus (2010), Hypertension, [facility] spotted fever (2010), and Stress-induced cardiomyopathy (6/11).  has a past surgical  history that includes Shunt placement in head (2010); partial hysterectomy - due to bleeding issues; cholecystectomy : stones; Cardiac catheterization; Cholecystectomy; Abdominal hysterectomy; Tonsillectomy; and Brain surgery. Prior to Admission medications   Medication Sig Start Date End Date Taking? Authorizing Provider  amLODipine (NORVASC) 10 MG tablet TAKE ONE TABLET BY MOUTH DAILY 04/30/15  Yes Fayrene Helper, MD  aspirin (ASPIRIN LOW DOSE) 81 MG EC tablet Take 81 mg by mouth daily.     Yes [provider]  fluticasone furoate-vilanterol (BREO ELLIPTA) 100-25 MCG/INH AEPB Inhale 1 puff into the lungs daily.   Yes [provider]  furosemide (LASIX) 20 MG tablet Take 1 tablet (20 mg total) by mouth daily. 11/16/17 11/16/18 Yes Georgette Shell, MD  lisinopril (PRINIVIL,ZESTRIL) 5 MG tablet Take 1 tablet (5 mg total) by mouth daily. 11/16/17 11/16/18 Yes Georgette Shell, MD  metoprolol tartrate (LOPRESSOR) 25 MG tablet Take 1 tablet (25 mg total) by mouth 2 (two) times daily. 01/14/15  Yes Fayrene Helper, MD  potassium chloride (K-DUR) 10 MEQ tablet Take 1 tablet (10 mEq total) by mouth daily. 11/16/17  Yes Georgette Shell, MD  pravastatin (PRAVACHOL) 10 MG tablet TAKE 1 TABLETBY MOUTH DAILY AT 6 PM. 11/16/17  Yes [provider]  umeclidinium bromide (INCRUSE ELLIPTA) 62.5 MCG/INH AEPB Inhale 1 puff into the lungs daily.   Yes [provider]  lovastatin (MEVACOR) 40 MG tablet Take 1 tablet (40 mg total) by mouth at bedtime. Patient not taking: Reported on 11/24/2017 11/16/17   Georgette Shell, MD   Allergies  Allergen Reactions  . Hydrochlorothiazide     CAUSES LIFE THREATENING HYPONATREMIA NA 111  . Penicillins     Made her sleep all day Has  patient had a PCN reaction causing immediate rash, facial/tongue/throat swelling, SOB or lightheadedness with hypotension YES Has patient had a PCN reaction causing severe rash involving mucus  membranes or skin necrosis: No Has patient had a PCN reaction that required hospitalization: No Has patient had a PCN reaction occurring within the last 10 years: No If all of the above answers are "NO", then may proceed with Cephalosporin use.     FAMILY HISTORY:  family history includes Diabetes in her mother; Heart disease in her brother; Hyperlipidemia in her sister; Hypertension in her father, mother, and sister; Kidney failure in her mother; Throat cancer in her sister; Thyroid cancer in her mother; Thyroid disease in her sister. SOCIAL HISTORY:  reports that  has never smoked. she has never used smokeless tobacco. She reports that she drinks alcohol. She reports that she does not use drugs.  REVIEW OF SYSTEMS:  Pertinent positives are bolded Constitutional: Negative for fever, + chills, weight loss, malaise/fatigue and diaphoresis.  HENT: Negative for hearing loss, ear pain, nosebleeds, congestion, sore throat, neck pain, tinnitus and ear discharge.   Eyes: Negative for blurred vision, double vision, photophobia, pain, discharge and redness.  Respiratory:+cough, hemoptysis, - sputum production, shortness of breath, wheezing and stridor.   Cardiovascular: Negative for chest pain, chest discomfort, palpitations, orthopnea, claudication, leg swelling and PND.  Gastrointestinal: Negative for heartburn, nausea, vomiting, abdominal pain, diarrhea, constipation, blood in stool and melena.  Genitourinary: Negative for dysuria, urgency, frequency, hematuria and flank pain.  Musculoskeletal: Negative for myalgias, back pain, joint pain and falls.  Skin: Negative for itching and rash.  Neurological: Negative for dizziness, tingling, tremors, sensory change, speech change, focal weakness, seizures, loss of consciousness, weakness and headaches.  Endo/Heme/Allergies: Negative for environmental allergies and polydipsia. Does not bruise/bleed easily.  SUBJECTIVE:   VITAL SIGNS: Pulse Rate:   [84-94] 87 (01/30 1845) Resp:  [18-30] 25 (01/30 1845) BP: (103-144)/(10-118) 132/60 (01/30 1830) SpO2:  [90 %-100 %] 97 % (01/30 1845)  PHYSICAL EXAMINATION: General:  In no acute distress. Sleeping intially Neuro:  arousable to verbal stimuli, no focal deficits GCS 12-14 HEENT:  NIPPV mask on with no large leak,  Cardiovascular:  S1 and S2 appreciated no rub or gallop Lungs:  Decreased breath sounds bilat. Some air entry appreciated. No wheezing. Abdomen:  Soft NT, ND + BS Musculoskeletal:  +2 edema in lower ext. Min kyphosis to spine Skin:  Sacral decub stage 2  Recent Labs  Lab 11/24/17 1609  NA 140  K 3.0*  CL 97*  CO2 33*  BUN 13  CREATININE 0.58  GLUCOSE 179*   Recent Labs  Lab 11/24/17 1609  HGB 11.4*  HCT 34.2*  WBC 7.4  PLT 417*   Dg Chest Port 1 View  Result Date: 11/24/2017 CLINICAL DATA:  Shortness of breath. EXAM: PORTABLE CHEST 1 VIEW COMPARISON:  11/15/2017 and 11/10/2017 and 11/20/2013 FINDINGS: The lungs are hyperinflated with flattening of the diaphragm consistent with emphysema. No acute infiltrates or effusions. Heart size is normal. Aortic atherosclerosis. Pulmonary vascularity is within normal limits. IMPRESSION: 1. Emphysema. 2. Aortic atherosclerosis. 3. No acute abnormalities. Electronically Signed   By: Lorriane Shire M.D.   On: 11/24/2017 16:08    ASSESSMENT / PLAN: NEURO: Altered mental status secondary to hypoxia from acute respiratory distress,  Appears to be resolving Patient is arousable responds appropriately to questions and is verbal. GCS 12-14 Not currently on any sedation If pt becomes somnolent and not arousable or has inability managing her  secretions or worsening ABG despite good volumes and good mask seal on NIPPV-> then intubation should be considered.   In her current clinical appearance she may remain on NIPPV.  History of hydrocephalus status post VP shunt No fever on evaluation No complaints of headache No changes in  urinary habits Low suspicion for any complications or infection in this area Last CT head was performed on 11/10/2017 it showed -Acute sinusitis and chronic microvascular ischemia progression seen but no other pathology   CARDIAC: Sepsis-etiology unknown Suspected sources include stage 2 sacral ulcer, evidence of sinusitis on CTH on 16th, Resp source? IVF resuscitation, Hold all home antihypertensives Not on vasopressors MAP goal greater than 60 BNP 380-she has bilateral lower extremity edema +2,  patient states that this is not different from her baseline Echo in 2011 showed: mild ballooning of the apical segment of the LV The estimated ejection fraction was 50%. Regional wall motion abnormalities: There is severe hypokinesis of the distal anteroseptal and apical myocardium. The transmitral flow pattern was normal. The deceleration time of the early transmitral flow velocity was normal. The pulmonary vein flow pattern was normal. The tissue Doppler parameters were normal. Left ventricular diastolic function parameters were normal. Given symptoms and past echo- a repeat 2 D echo should be performed.  PULMONARY: Acute exacerbation of COPD with hypercapnic respiratory failure Continues  on noninvasive positive pressure ventilation Current TV >1000cc ABG improved after therapy and pt has decreased work of breathing. Last PFTs: in 2014 showed severe ventilatory defect with evidence of airflow obstruction, air trapping, with bronchodilator improvement consistent with COPD. Recommend bronchodilators with anticholinergic therapy Taper steroids to off pt received a 125mg  solumedrol with EMS No wheezing on my exam  Would place pt on macrolide or fluoroquinolone as meta analyses have shown better outcomes in COPD pts who receive antibiotics. If PCT negative they can be discontinued. On discharge recommend pt be considered for daily Azithromycin to reduce number of acute exacerbations. Needs a  repeat PFT and pulmonary follow up on discharge.   ID: Blood cultures x2 have been sent PCT ordered Recommend macrolide or fluoroquinolone pt has COPD  ordered RVP panel Reviewed medical h/o frequent visits for Bronchitis fo Fam Med H/o accompanying sinus issues.  Recurrent sinusitis can drain and lead to repeated lower resp tract infections. 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  Baseline Cr noted .no acute issues Lab Results  Component Value Date   CREATININE 0.58 11/24/2017   CREATININE 0.52 11/15/2017   CREATININE 0.49 11/14/2017  Hypokalemia - most likely secondary to beta agonists effect K=3--> goal 4. Needs replacement.     I, Dr Seward Carol have personally reviewed patient's available data, including medical history, events of note, physical examination and test results as part of my evaluation. I have discussed with other care providers such as , RN and Elink. The patient does not require the high complexity decision making, assessment, or frequent evaluation and titration of therapies, application of advanced monitoring technologies and extensive interpretation of multiple databases found in a critical care setting. Consult Time devoted to patient care services  described in this note is 45 Minutes. This time reflects time of care of this signee Dr Seward Carol. This critical care time does not reflect procedure time, or teaching time or supervisory time of  but could involve care discussion time    DISPOSITION: Stepdown CC TIME: 45 minutes PROGNOSIS: Guarded FAMILY:  None at bedside CODE STATUS: Full   Signed Dr Seward Carol Pulmonary Critical Care Locums [facility]

## 2017-11-24 NOTE — ED Provider Notes (Signed)
[facility] DEPT Provider Note   CSN: [id number] Arrival date & time: 11/24/17  1431     History   Chief Complaint Chief Complaint  Patient presents with  . Shortness of Breath    HPI Angela Duncan is a 67 y.o. female.  67 year old female with prior history of asthma, COPD, hypertension, status post ventriculostomy catheter placement, and hyponatremia presents for evaluation of shortness of breath.  Patient reports that she began to feel short of breath earlier today.  She denies associated fever.  She does report a mild cough.  EMS has already given the patient 125 mg of Solu-Medrol and 1 breathing treatment enroute to the ED.  She reports that she feels somewhat improved.  She reports that she used breathing treatments at home prior to EMS arrival without improvement.  Of note, patient with recent admission for treatment of severe hyponatremia (111) with AMS.    The history is provided by the patient.  Shortness of Breath  This is a recurrent problem. The average episode lasts 1 day. The problem occurs intermittently.The current episode started 6 to 12 hours ago. Associated symptoms include cough and wheezing. Pertinent negatives include no fever and no chest pain. She has tried ipratropium inhalers for the symptoms. The treatment provided mild relief. She has had prior hospitalizations. She has had prior ED visits. She has had prior ICU admissions. Associated medical issues include asthma and COPD.    Past Medical History:  Diagnosis Date  . Anxiety    manged by Dr. Luan Pulling   . Asthma 1993   managed by Dr. Luan Pulling   . Depression    managed by Dr. Luan Pulling   . Hydrocephalus 2010  . Hypertension   . [place] spotted fever 2010  . 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 Pressure injury of skin 11/12/2017  . Altered mental status   . Hyponatremia   . Depression 06/15/2013  . COPD  with asthma ([place]) 06/15/2013  . Colon cancer high risk 05/31/2012  . Insomnia 04/04/2011  . 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 VITAMIN D DEFICIENCY 08/12/2010  . Hyperlipidemia LDL goal <100 05/06/2010  . Essential hypertension 02/25/2010    Past Surgical History:  Procedure Laterality Date  . ABDOMINAL HYSTERECTOMY    . BRAIN SURGERY    . CARDIAC CATHETERIZATION    . CHOLECYSTECTOMY    . cholecystectomy : stones    . partial hysterectomy - due to bleeding issues    . 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 TONSILLECTOMY      OB History    No data available       Home Medications    Prior to Admission medications   Medication Sig Start Date End Date Taking? Authorizing Provider  amLODipine (NORVASC) 10 MG tablet TAKE ONE TABLET BY MOUTH DAILY 04/30/15   Fayrene Helper, MD  aspirin (ASPIRIN LOW DOSE) 81 MG EC tablet Take 81 mg by mouth daily.      [provider]  fluticasone furoate-vilanterol (BREO ELLIPTA) 100-25 MCG/INH AEPB Inhale 1 puff into the lungs daily.    [provider]  furosemide (LASIX) 20 MG tablet Take 1 tablet (20 mg total) by mouth daily. 11/16/17 11/16/18  Georgette Shell, MD  lisinopril (PRINIVIL,ZESTRIL) 5 MG tablet Take 1 tablet (5 mg total) by mouth daily. 11/16/17 11/16/18  Georgette Shell, MD  lovastatin (MEVACOR) 40 MG tablet Take 1  tablet (40 mg total) by mouth at bedtime. 11/16/17   Georgette Shell, MD  metoprolol tartrate (LOPRESSOR) 25 MG tablet Take 1 tablet (25 mg total) by mouth 2 (two) times daily. 01/14/15   Fayrene Helper, MD  potassium chloride (K-DUR) 10 MEQ tablet Take 1 tablet (10 mEq total) by mouth daily. 11/16/17   Georgette Shell, MD  umeclidinium bromide (INCRUSE ELLIPTA) 62.5 MCG/INH AEPB Inhale 1 puff into the lungs daily.    [provider]    Family History Family History  Problem Relation Age of Onset  . Diabetes Mother   . Hypertension Mother   . Thyroid cancer Mother   .  Kidney failure Mother   . Hypertension Father   . Heart disease Brother        Stent in heart, also has blocklage in neck   . Throat cancer Sister   . Hypertension Sister   . Hyperlipidemia Sister   . Thyroid disease Sister     Social History Social History   Tobacco Use  . Smoking status: Never Smoker  . Smokeless tobacco: Never Used  Substance Use Topics  . Alcohol use: Yes  . Drug use: No     Allergies   Hydrochlorothiazide and Penicillins   Review of Systems Review of Systems  Constitutional: Negative for fever.  Respiratory: Positive for cough, shortness of breath and wheezing. Negative for apnea and chest tightness.   Cardiovascular: Negative for chest pain.  All other systems reviewed and are negative.    Physical Exam Updated Vital Signs BP (!) 144/10 (BP Location: Right Arm)   Pulse 94   Resp 18   SpO2 94%   Physical Exam  Constitutional: She is oriented to person, place, and time. She appears well-developed and well-nourished. No distress.  HENT:  Head: Normocephalic and atraumatic.  Mouth/Throat: Oropharynx is clear and moist.  Eyes: Conjunctivae and EOM are normal. Pupils are equal, round, and reactive to light.  Neck: Normal range of motion. Neck supple.  Cardiovascular: Normal rate, regular rhythm and normal heart sounds.  Pulmonary/Chest: She is in respiratory distress.  Mild respiratory distress  Diffuse Expiratory wheezes in all lung fields   Abdominal: Soft. She exhibits no distension. There is no tenderness.  Musculoskeletal: Normal range of motion. She exhibits no edema or deformity.  Neurological: She is alert and oriented to person, place, and time.  Skin: Skin is warm and dry.  Psychiatric: She has a normal mood and affect.  Nursing note and vitals reviewed.    ED Treatments / Results  Labs (all labs ordered are listed, but only abnormal results are displayed) Labs Reviewed  CULTURE, BLOOD (ROUTINE X 2)  CULTURE, BLOOD  (ROUTINE X 2)  COMPREHENSIVE METABOLIC PANEL  BRAIN NATRIURETIC PEPTIDE  CBC WITH DIFFERENTIAL/PLATELET  PROTIME-INR  BLOOD GAS, ARTERIAL  URINALYSIS, ROUTINE W REFLEX MICROSCOPIC  I-STAT CG4 LACTIC ACID, ED  I-STAT TROPONIN, ED    EKG  EKG Interpretation None       Radiology No results found.  Procedures Procedures (including critical care time) CRITICAL CARE Performed by: Valarie Merino   Total critical care time: 30 minutes  Critical care time was exclusive of separately billable procedures and treating other patients.  Critical care was necessary to treat or prevent imminent or life-threatening deterioration.  Critical care was time spent personally by me on the following activities: development of treatment plan with patient and/or surrogate as well as nursing, discussions with consultants, evaluation of patient's response  to treatment, examination of patient, obtaining history from patient or surrogate, ordering and performing treatments and interventions, ordering and review of laboratory studies, ordering and review of radiographic studies, pulse oximetry and re-evaluation of patient's condition.   Medications Ordered in ED Medications  ipratropium-albuterol (DUONEB) 0.5-2.5 (3) MG/3ML nebulizer solution 3 mL (not administered)     Initial Impression / Assessment and Plan / ED Course  I have reviewed the triage vital signs and the nursing notes.  Pertinent labs & imaging results that were available during my care of the patient were reviewed by me and considered in my medical decision making (see chart for details).      1545 Patient appears improved following initial treatment - however ABG reveals ph 7.29, pco2 74, Po2 119 - patient's WOB still slightly high - will trial bipap.   1730  Patient appears improved on Bipap - Will obtain repeat ABG and discuss case with admitting hospitalist service  MDM   Screen complete  Patient is presenting with  complaint of shortness of breath.  Her presentation and exam are consistent with likely COPD exacerbation.  Patient has already received 125 mg of Solu-Medrol prior to arrival.  Lab results show mild acidosis and mild hypercarbia.  She is currently on BiPAP.  Between breathing treatments and BiPAP she appears improved.  Case discussed with the admitting hospitalist service and they will evaluate for admission.   Final Clinical Impressions(s) / ED Diagnoses   Final diagnoses:  SOB (shortness of breath)  COPD exacerbation [facility])    ED Discharge Orders    None       Valarie Merino, MD 11/24/17 1754

## 2017-11-24 NOTE — ED Notes (Signed)
Attempted to call report, RN in a patients room and will call back.

## 2017-11-24 NOTE — ED Notes (Signed)
Please call patients brother if anything is needed or for necessary updates  Angela Duncan [phone]

## 2017-11-24 NOTE — ED Triage Notes (Signed)
She phoned EMS d/t shortness of breath. En route, EMS gave a total of 10mg  Albuterol neb., plus 0.5 Atrovent; also 125mg  IV Sou Medrol. She arrives short of breath, but not dyspneic.

## 2017-11-24 NOTE — ED Notes (Signed)
RRT AT BEDSIDE. BIPAP

## 2017-11-24 NOTE — Progress Notes (Signed)
Pt transported from ED to 1222 on bipap 40% fio2.  Pt tolerated transport well without incident.

## 2017-11-24 NOTE — Care Management Note (Signed)
Case Management Note  Patient Details  Name: Angela Duncan MRN: [id number] Date of Birth: October 27, 1950  Pt is active with [facility] PT.  Santiago Glad with [facility] aware pt is in the ED.  Expected Discharge Date:   Unknown               Expected Discharge Plan:  [place] Choice:  Home Health Choice offered to:  Patient  HH Arranged:  PT [place]:  [place]  Status of Service:  In process, will continue to follow  Rae Mar, RN 11/24/2017, 3:36 PM

## 2017-11-24 NOTE — ED Notes (Signed)
ED TO INPATIENT HANDOFF REPORT  Name/Age/Gender Angela Duncan 67 y.o. female  Code Status Code Status History    Date Active Date Inactive Code Status Order ID Comments User Context   11/10/2017 06:34 11/16/2017 14:46 Full Code [id number]  Germain Osgood PA-C ED      Home/SNF/Other Home  Chief Complaint Shortness of Breath  Level of Care/Admitting Diagnosis ED Disposition    ED Disposition Condition [facility] Area: [facility] [[id number]]  Level of Care: Stepdown [14]  Admit to SDU based on following criteria: Respiratory Distress:  Frequent assessment and/or intervention to maintain adequate ventilation/respiration, pulmonary toilet, and respiratory treatment.  Diagnosis: Acute respiratory failure ([place]) [518.81.ICD-9-CM]  Admitting Physician: Patrecia Pour [[id number]]  Attending Physician: Patrecia Pour [id number]  Estimated length of stay: 3 - 4 days  Certification:: I certify this patient will need inpatient services for at least 2 midnights  PT Class (Do Not Modify): Inpatient [101]  PT Acc Code (Do Not Modify): Private [1]       Medical History Past Medical History:  Diagnosis Date  . Anxiety    manged by Dr. Luan Pulling   . Asthma 1993   managed by Dr. Luan Pulling   . Depression    managed by Dr. Luan Pulling   . Hydrocephalus 2010  . Hypertension   . [place] spotted fever 2010  . 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 Hydrochlorothiazide     CAUSES LIFE THREATENING HYPONATREMIA NA 111  . Penicillins     Made her sleep all day Has patient had a PCN reaction causing immediate rash, facial/tongue/throat swelling, SOB or lightheadedness with hypotension YES Has patient had a PCN reaction causing severe rash involving mucus membranes or skin necrosis: No Has patient had a PCN reaction that required hospitalization: No Has patient had a PCN reaction occurring within the last 10 years: No If all  of the above answers are "NO", then may proceed with Cephalosporin use.     IV Location/Drains/Wounds Patient Lines/Drains/Airways Status   Active Line/Drains/Airways    Name:   Placement date:   Placement time:   Site:   Days:   Peripheral IV 11/24/17 Left Antecubital   11/24/17    1445    Antecubital   less than 1   Peripheral IV 11/24/17 Left Forearm   11/24/17    1941    Forearm   less than 1   External Urinary Catheter   11/10/17    1850    -   14   Pressure Injury 11/10/17 Stage II -  Partial thickness loss of dermis presenting as a shallow open ulcer with a red, pink wound bed without slough.   11/10/17    1851     14          Labs/Imaging Results for orders placed or performed during the hospital encounter of 11/24/17 (from the past 48 hour(s))  Blood gas, arterial     Status: Abnormal   Collection Time: 11/24/17  3:35 PM  Result Value Ref Range   O2 Content 6.0 L/min   Delivery systems AEROSOL MASK    pH, Arterial 7.299 (L) 7.350 - 7.450   pCO2 arterial 74.3 (HH) 32.0 - 48.0 mmHg    Comment: CRITICAL RESULT CALLED TO, READ BACK BY AND VERIFIED WITH: P.MESSICK, MD AT 1542 BY M.JESTER, RRT, RCP ON 11/24/17    pO2, Arterial 119 (H) 83.0 -  108.0 mmHg   Bicarbonate 35.4 (H) 20.0 - 28.0 mmol/L   Acid-Base Excess 7.3 (H) 0.0 - 2.0 mmol/L   O2 Saturation 97.8 %   Patient temperature 98.6    Collection site RIGHT RADIAL    Drawn by [phone]    Sample type ARTERIAL DRAW    Allens test (pass/fail) PASS PASS  Comprehensive metabolic panel     Status: Abnormal   Collection Time: 11/24/17  4:09 PM  Result Value Ref Range   Sodium 140 135 - 145 mmol/L   Potassium 3.0 (L) 3.5 - 5.1 mmol/L   Chloride 97 (L) 101 - 111 mmol/L   CO2 33 (H) 22 - 32 mmol/L   Glucose, Bld 179 (H) 65 - 99 mg/dL   BUN 13 6 - 20 mg/dL   Creatinine, Ser 0.58 0.44 - 1.00 mg/dL   Calcium 9.0 8.9 - 10.3 mg/dL   Total Protein 6.5 6.5 - 8.1 g/dL   Albumin 3.3 (L) 3.5 - 5.0 g/dL   AST 38 15 - 41 U/L   ALT 21  14 - 54 U/L   Alkaline Phosphatase 74 38 - 126 U/L   Total Bilirubin 0.7 0.3 - 1.2 mg/dL   GFR calc non Af Amer >60 >60 mL/min   GFR calc Af Amer >60 >60 mL/min    Comment: (NOTE) The eGFR has been calculated using the CKD EPI equation. This calculation has not been validated in all clinical situations. eGFR's persistently <60 mL/min signify possible Chronic Kidney Disease.    Anion gap 10 5 - 15  Brain natriuretic peptide     Status: Abnormal   Collection Time: 11/24/17  4:09 PM  Result Value Ref Range   B Natriuretic Peptide 380.2 (H) 0.0 - 100.0 pg/mL  CBC with Differential     Status: Abnormal   Collection Time: 11/24/17  4:09 PM  Result Value Ref Range   WBC 7.4 4.0 - 10.5 K/uL   RBC 3.27 (L) 3.87 - 5.11 MIL/uL   Hemoglobin 11.4 (L) 12.0 - 15.0 g/dL   HCT 34.2 (L) 36.0 - 46.0 %   MCV 104.6 (H) 78.0 - 100.0 fL   MCH 34.9 (H) 26.0 - 34.0 pg   MCHC 33.3 30.0 - 36.0 g/dL   RDW 13.7 11.5 - 15.5 %   Platelets 417 (H) 150 - 400 K/uL   Neutrophils Relative % 85 %   Neutro Abs 6.4 1.7 - 7.7 K/uL   Lymphocytes Relative 10 %   Lymphs Abs 0.7 0.7 - 4.0 K/uL   Monocytes Relative 5 %   Monocytes Absolute 0.4 0.1 - 1.0 K/uL   Eosinophils Relative 0 %   Eosinophils Absolute 0.0 0.0 - 0.7 K/uL   Basophils Relative 0 %   Basophils Absolute 0.0 0.0 - 0.1 K/uL  Protime-INR     Status: None   Collection Time: 11/24/17  4:09 PM  Result Value Ref Range   Prothrombin Time 13.1 11.4 - 15.2 seconds   INR 1.00   I-stat troponin, ED     Status: None   Collection Time: 11/24/17  4:20 PM  Result Value Ref Range   Troponin i, poc 0.01 0.00 - 0.08 ng/mL   Comment 3            Comment: Due to the release kinetics of cTnI, a negative result within the first hours of the onset of symptoms does not rule out myocardial infarction with certainty. If myocardial infarction is still suspected, repeat the test at  appropriate intervals.   I-Stat CG4 Lactic Acid, ED     Status: Abnormal   Collection  Time: 11/24/17  4:22 PM  Result Value Ref Range   Lactic Acid, Venous 2.22 (HH) 0.5 - 1.9 mmol/L   Comment NOTIFIED PHYSICIAN   Blood gas, arterial     Status: Abnormal   Collection Time: 11/24/17  5:42 PM  Result Value Ref Range   FIO2 40.00    Delivery systems BILEVEL POSITIVE AIRWAY PRESSURE    LHR 10 resp/min   Inspiratory PAP 12    Expiratory PAP 5    pH, Arterial 7.348 (L) 7.350 - 7.450   pCO2 arterial 66.8 (HH) 32.0 - 48.0 mmHg    Comment: CRITICAL RESULT CALLED TO, READ BACK BY AND VERIFIED WITH: P.MESSICK, MD AT 1747 BY M.JESTER, RRT, RCP ON 11/24/17    pO2, Arterial 94.6 83.0 - 108.0 mmHg   Bicarbonate 35.8 (H) 20.0 - 28.0 mmol/L   Acid-Base Excess 8.7 (H) 0.0 - 2.0 mmol/L   O2 Saturation 96.7 %   Patient temperature 98.6    Collection site RIGHT RADIAL    Drawn by [id number]    Sample type ARTERIAL DRAW    Allens test (pass/fail) PASS PASS  I-Stat CG4 Lactic Acid, ED     Status: None   Collection Time: 11/24/17  7:44 PM  Result Value Ref Range   Lactic Acid, Venous 1.61 0.5 - 1.9 mmol/L   Dg Chest Port 1 View  Result Date: 11/24/2017 CLINICAL DATA:  Shortness of breath. EXAM: PORTABLE CHEST 1 VIEW COMPARISON:  11/15/2017 and 11/10/2017 and 11/20/2013 FINDINGS: The lungs are hyperinflated with flattening of the diaphragm consistent with emphysema. No acute infiltrates or effusions. Heart size is normal. Aortic atherosclerosis. Pulmonary vascularity is within normal limits. IMPRESSION: 1. Emphysema. 2. Aortic atherosclerosis. 3. No acute abnormalities. Electronically Signed   By: Lorriane Shire M.D.   On: 11/24/2017 16:08    Pending Labs Unresulted Labs (From admission, onward)   Start     Ordered   11/24/17 1512  Culture, blood (routine x 2)  BLOOD CULTURE X 2,   STAT     11/24/17 1511   11/24/17 1511  Urinalysis, Routine w reflex microscopic  STAT,   STAT     11/24/17 1511   Signed and Held  CBC  (enoxaparin (LOVENOX)    CrCl >/= 30 ml/min)  Once,   R    Comments:   Baseline for enoxaparin therapy IF NOT ALREADY DRAWN.  Notify MD if PLT < 100 K.    Signed and Held   Signed and Held  Creatinine, serum  (enoxaparin (LOVENOX)    CrCl >/= 30 ml/min)  Once,   R    Comments:  Baseline for enoxaparin therapy IF NOT ALREADY DRAWN.    Signed and Held   Signed and Held  Creatinine, serum  (enoxaparin (LOVENOX)    CrCl >/= 30 ml/min)  Weekly,   R    Comments:  while on enoxaparin therapy    Signed and Held   Signed and Held  Basic metabolic panel  Tomorrow morning,   R     Signed and Held   Signed and Held  CBC  Tomorrow morning,   R     Signed and Held   Signed and Held  Troponin I (q 6hr x 3)  Now then every 6 hours,   R     Signed and Held   Signed and Held  Vitamin B12  (  Anemia Panel (PNL))  Tomorrow morning,   R     Signed and Held   Signed and Held  Folate  (Anemia Panel (PNL))  Tomorrow morning,   R     Signed and Held   Signed and Held  Iron and TIBC  (Anemia Panel (PNL))  Tomorrow morning,   R     Signed and Held   Signed and Held  Ferritin  (Anemia Panel (PNL))  Tomorrow morning,   R     Signed and Held   Signed and Held  Reticulocytes  (Anemia Panel (PNL))  Tomorrow morning,   R     Signed and Held      Vitals/Pain Today's Vitals   11/24/17 1830 11/24/17 1845 11/24/17 2028 11/24/17 2040  BP: 132/60  94/79   Pulse: 93 87 89   Resp: (!) 29 (!) 25 (!) 27   SpO2: 97% 97% 97% 96%  PainSc:        Isolation Precautions No active isolations  Medications Medications  ipratropium-albuterol (DUONEB) 0.5-2.5 (3) MG/3ML nebulizer solution 3 mL (3 mLs Nebulization Given 11/24/17 2040)  ipratropium-albuterol (DUONEB) 0.5-2.5 (3) MG/3ML nebulizer solution 3 mL (3 mLs Nebulization Given 11/24/17 1526)  LORazepam (ATIVAN) injection 0.5 mg (0.5 mg Intravenous Given 11/24/17 1612)  sodium chloride 0.9 % bolus 500 mL (0 mLs Intravenous Stopped 11/24/17 1903)  potassium chloride 10 mEq in 100 mL IVPB (0 mEq Intravenous Stopped 11/24/17 1905)     Mobility non-ambulatory

## 2017-11-24 NOTE — ED Notes (Signed)
CASE MANAGER JONATHAN SPEAKING WITH FAMILY AND PT AT PRESENT

## 2017-11-24 NOTE — ED Notes (Signed)
Hooked pt up to monitor but was unable to get an EKG because she is moving too much to get a good reading due to her SOB

## 2017-11-25 ENCOUNTER — Other Ambulatory Visit: Payer: Self-pay

## 2017-11-25 ENCOUNTER — Other Ambulatory Visit (HOSPITAL_COMMUNITY): Payer: Medicare HMO

## 2017-11-25 DIAGNOSIS — R0602 Shortness of breath: Secondary | ICD-10-CM

## 2017-11-25 LAB — BASIC METABOLIC PANEL
Anion gap: 6 (ref 5–15)
BUN: 15 mg/dL (ref 6–20)
CHLORIDE: 97 mmol/L — AB (ref 101–111)
CO2: 34 mmol/L — ABNORMAL HIGH (ref 22–32)
Calcium: 8.5 mg/dL — ABNORMAL LOW (ref 8.9–10.3)
Creatinine, Ser: 0.51 mg/dL (ref 0.44–1.00)
GFR calc non Af Amer: >60 mL/min (ref >60)
Glucose, Bld: 168 mg/dL — ABNORMAL HIGH (ref 65–99)
POTASSIUM: 3 mmol/L — AB (ref 3.5–5.1)
SODIUM: 137 mmol/L (ref 135–145)

## 2017-11-25 LAB — IRON AND TIBC
Iron: 29 ug/dL (ref 28–170)
SATURATION RATIOS: 16 % (ref 10.4–31.8)
TIBC: 178 ug/dL — AB (ref 250–450)
UIBC: 149 ug/dL

## 2017-11-25 LAB — VITAMIN B12: VITAMIN B 12: 572 pg/mL (ref 180–914)

## 2017-11-25 LAB — BLOOD GAS, ARTERIAL
ACID-BASE EXCESS: 9.4 mmol/L — AB (ref 0.0–2.0)
Bicarbonate: 37 mmol/L — ABNORMAL HIGH (ref 20.0–28.0)
DELIVERY SYSTEMS: POSITIVE
DRAWN BY: 11249
EXPIRATORY PAP: 5
FIO2: 40
Inspiratory PAP: 14
MODE: POSITIVE
O2 Saturation: 96.1 %
PO2 ART: 87.5 mmHg (ref 83.0–108.0)
Patient temperature: 99
RATE: 10 resp/min
pCO2 arterial: 74.2 mmHg (ref 32.0–48.0)
pH, Arterial: 7.32 — ABNORMAL LOW (ref 7.350–7.450)

## 2017-11-25 LAB — RESPIRATORY PANEL BY PCR
Adenovirus: NOT DETECTED
BORDETELLA PERTUSSIS-RVPCR: NOT DETECTED
CHLAMYDOPHILA PNEUMONIAE-RVPPCR: NOT DETECTED
CORONAVIRUS 229E-RVPPCR: NOT DETECTED
CORONAVIRUS HKU1-RVPPCR: NOT DETECTED
Coronavirus NL63: NOT DETECTED
Coronavirus OC43: NOT DETECTED
Influenza A: NOT DETECTED
Influenza B: NOT DETECTED
METAPNEUMOVIRUS-RVPPCR: NOT DETECTED
Mycoplasma pneumoniae: NOT DETECTED
PARAINFLUENZA VIRUS 2-RVPPCR: NOT DETECTED
Parainfluenza Virus 1: NOT DETECTED
Parainfluenza Virus 3: NOT DETECTED
Parainfluenza Virus 4: NOT DETECTED
Respiratory Syncytial Virus: DETECTED — AB
Rhinovirus / Enterovirus: NOT DETECTED

## 2017-11-25 LAB — TROPONIN I
Troponin I: <0.03 ng/mL (ref <0.03)
Troponin I: <0.03 ng/mL (ref <0.03)

## 2017-11-25 LAB — CBC
HEMATOCRIT: 28.6 % — AB (ref 36.0–46.0)
HEMATOCRIT: 26.4 % — AB (ref 36.0–46.0)
Hemoglobin: 9.4 g/dL — ABNORMAL LOW (ref 12.0–15.0)
Hemoglobin: 8.8 g/dL — ABNORMAL LOW (ref 12.0–15.0)
MCH: 34.4 pg — ABNORMAL HIGH (ref 26.0–34.0)
MCH: 35.1 pg — AB (ref 26.0–34.0)
MCHC: 32.9 g/dL (ref 30.0–36.0)
MCHC: 33.3 g/dL (ref 30.0–36.0)
MCV: 104.8 fL — AB (ref 78.0–100.0)
MCV: 105.2 fL — AB (ref 78.0–100.0)
PLATELETS: 292 10*3/uL (ref 150–400)
Platelets: 308 10*3/uL (ref 150–400)
RBC: 2.73 MIL/uL — AB (ref 3.87–5.11)
RBC: 2.51 MIL/uL — ABNORMAL LOW (ref 3.87–5.11)
RDW: 13.9 % (ref 11.5–15.5)
RDW: 13.9 % (ref 11.5–15.5)
WBC: 3.2 10*3/uL — AB (ref 4.0–10.5)
WBC: 6.8 10*3/uL (ref 4.0–10.5)

## 2017-11-25 LAB — FERRITIN: FERRITIN: 191 ng/mL (ref 11–307)

## 2017-11-25 LAB — RETICULOCYTES
RBC.: 2.73 MIL/uL — AB (ref 3.87–5.11)
RETIC CT PCT: 3.2 % — AB (ref 0.4–3.1)
Retic Count, Absolute: 87.4 10*3/uL (ref 19.0–186.0)

## 2017-11-25 LAB — PROCALCITONIN: PROCALCITONIN: 0.28 ng/mL

## 2017-11-25 LAB — MRSA PCR SCREENING: MRSA by PCR: NEGATIVE

## 2017-11-25 LAB — FOLATE: Folate: 27 ng/mL (ref >5.9)

## 2017-11-25 MED ORDER — LORAZEPAM 2 MG/ML IJ SOLN
0.5000 mg | Freq: Once | INTRAMUSCULAR | Status: AC
  Administered 2017-11-25: 0.5 mg via INTRAVENOUS

## 2017-11-25 MED ORDER — FENTANYL CITRATE (PF) 100 MCG/2ML IJ SOLN: 12.5000 ug | INTRAMUSCULAR | Status: DC | PRN

## 2017-11-25 MED ORDER — IPRATROPIUM-ALBUTEROL 0.5-2.5 (3) MG/3ML IN SOLN
3.0000 mL | RESPIRATORY_TRACT | Status: DC
  Administered 2017-11-25 – 2017-12-04 (×54): 3 mL via RESPIRATORY_TRACT
  Filled 2017-11-25 (×55): qty 3

## 2017-11-25 MED ORDER — DEXMEDETOMIDINE HCL IN NACL 200 MCG/50ML IV SOLN
0.4000 ug/kg/h | INTRAVENOUS | Status: DC
  Administered 2017-11-25: 0.4 ug/kg/h via INTRAVENOUS
  Administered 2017-11-25: 0.5 ug/kg/h via INTRAVENOUS
  Administered 2017-11-26 (×2): 0.7 ug/kg/h via INTRAVENOUS
  Filled 2017-11-25 (×5): qty 50

## 2017-11-25 MED ORDER — POTASSIUM CHLORIDE 10 MEQ/100ML IV SOLN
10.0000 meq | INTRAVENOUS | Status: AC
  Administered 2017-11-25 (×4): 10 meq via INTRAVENOUS
  Filled 2017-11-25 (×4): qty 100

## 2017-11-25 NOTE — Progress Notes (Addendum)
Pt placed back on BIPAP per MD.

## 2017-11-25 NOTE — Progress Notes (Signed)
Pt took off BIPAP and placed on 2PLM [place]. No distress noted at this time.

## 2017-11-25 NOTE — Consult Note (Signed)
[facility] Nurse wound consult note Reason for Consult:sacral stage II ulcer, post left leg intact dry skin from ruptured blister Wound type:pressure, partial thickness Pressure Injury POA: Yes Measurement:sacral wound two 0.2 cm round x 0.1cm stage II ulcers with pink wound bed, scant serous drainage, no odor. Posterial left leg has 6cm x 3cm x 0cm dried skin from previous ruptured blister (per pt's brother) dark, dry, intact old layer of ruptured blister, no drainage. Wound bed:see above Drainage (amount, consistency, odor) see above Periwound:intact Dressing procedure/placement/frequency: I have provided nurses with orders for posterior left leg, continue foam dressing, peel Q shift to assess site and re-consult if wound changes. To sacral ulcer continue foam, peel q shift to assess wound, keep pt turned off back. Patient emaciated, will order pressure alleviating chair pad for hospital and home use. Patient with poor nutritional status, recommend nutritional consult, please order if you agree. We will not follow, but will remain available to this patient, to nursing, and the medical and/or surgical teams.  Please re-consult if we need to assist further.   Fara Olden, RN-C, WTA-C, [place] Wound Treatment Associate Ostomy Care Associate

## 2017-11-25 NOTE — Progress Notes (Signed)
.. ..  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  P CCM consulted Due to concern of mental status/somnolence while on BiPAP.   SIGNIFICANT EVENTS  Dyspnea requiring BiPAP  STUDIES:  Chest x-ray: Typical features of emphysema including: Hyperinflated lung fields, thin mediastinum, flattening of diaphragms.  There are no focal consolidations, and no signs of fluid overload.  ABG: Initial 7.29/74.3/119 improved to 7.34/60 6/94 Lactic acid 2.22   HISTORY OF PRESENT ILLNESS:  (Most of the history was acquired from the EMR, and other care providers)  67 year old female with past medical history of obstructive lung disease COPD (home medications include in [place], [place]), anxiety, depression, hydrocephalus status post VP shunt, hypertension, stress-induced cardiomyopathy, and a stage II sacral pressure ulcer presented on 11/24/2017 to ED with dyspnea which grew increasingly worse and was associated with a nonproductive cough, mild chest pain, but no fever.  EMS administered oxygen, Solu-Medrol 125 mg, and a breathing treatment in route patient had some mild improvement.  Pt continued on Duonebs, and received 0.5mg  Ativan to help her tolerate Bipap therapy. PCCM was consulted due to somnolence on NIPPV.   SUBJECTIVE:  Frail 67 year old female still with labored respiratory pattern VITAL SIGNS: Temp:  [97.5 F (36.4 C)-98.2 F (36.8 C)] 98.2 F (36.8 C) (01/31 0730) Pulse Rate:  [75-95] 94 (01/31 0800) Resp:  [17-30] 22  (01/31 0800) BP: (94-144)/(10-118) 118/92 (01/31 0800) SpO2:  [90 %-100 %] 94 % (01/31 0835) FiO2 (%):  [40 %] 40 % (01/31 0304) Weight:  [122 lb 2.2 oz (55.4 kg)] 122 lb 2.2 oz (55.4 kg) (01/31 0458)  PHYSICAL EXAMINATION: General: This is a frail 67 year old female she appears much older than her stated age. HEENT: Normocephalic atraumatic mucous membranes moist no jugular venous distention Pulmonary: Very prolonged expiratory wheeze some accessory use.  She does have some difficulty with exertion, speaking in 1-2 word phrases. Cardiac: Regular rate and rhythm Extremities: Brisk cap refill no edema Abdomen: Soft nontender Awake, speech somewhat slurred, follows commands, oriented x2   Recent Labs  Lab 11/24/17 1609 11/25/17 0442  NA 140 137  K 3.0* 3.0*  CL 97* 97*  CO2 33* 34*  BUN 13 15  CREATININE 0.58 0.51  GLUCOSE 179* 168*   Recent Labs  Lab 11/24/17 1609 11/25/17 0442  HGB 11.4* 9.4*  HCT 34.2* 28.6*  WBC 7.4 3.2*  PLT 417* 308   Dg Chest Port 1 View  Result Date: 11/24/2017 CLINICAL DATA:  Shortness of breath. EXAM: PORTABLE CHEST 1 VIEW COMPARISON:  11/15/2017 and 11/10/2017 and 11/20/2013 FINDINGS: The lungs are hyperinflated with flattening of the diaphragm consistent with emphysema. No acute infiltrates or effusions. Heart size is normal. Aortic atherosclerosis. Pulmonary vascularity is within normal limits. IMPRESSION: 1. Emphysema. 2. Aortic atherosclerosis. 3. No acute abnormalities. Electronically Signed   By: Lorriane Shire M.D.   On: 11/24/2017 16:08    ASSESSMENT / PLAN:   Acute on chronic hypercarbic & hypoxic respiratory failure in setting of AECOPD -off BIPAP this am. resp effort still labored. Has prolonged and pronounced expiratory wheeze.  -  PCXR personally reviewed: hyperinflated. No infiltrate.  -PCT not impressive Plan Cont supplemental oxygen and PRN BIPAP. She has limited respiratory reserves  Cont pulse ox No narcotics or sedating  meds Day # 2/5 azithro F/u RVP (still pending) Cont Steroids but decrease to 60 q 6, With her size 80 likely a little high  Trend fever and WBC curve Will change her BD regimen to Duoneb q 4 and budesonide q12; stop brovana for now Will need to eval her home rx; I wonder if she is taking these MDIs effectively Add flutter  Keep in SDU setting given risk of decompensation   Acute encephalopathy; presumed mix of History of hypercarbia/hypoxia and metabolic in origin. Superimpose on h/o hydrocephalus status post VP shunt Plan Cont supportive care Limit sedation  Presumed sepsis; LA cleared. Alternatively LA could have just represented WOB Plan Cont MIVFs Cont abx  Fluid and Electrolyte imbalance: Hypokalemia  Plan Replace and recheck  Anemia (macrocytic) Plan Trend cbc  Stage II sacral ulcer  Plan Per WO team   Hyperglycemia Plan ssi   Erick Colace ACNP-BC [place] Pager # [phone] OR # [phone] if no answer

## 2017-11-25 NOTE — Progress Notes (Signed)
PM walk rounds  Been on bipap most of day Got tachypneic/agitated -> hospitalist gave ativan and a bit sleep now 6:00 PM  Plan precedex gtt fent prn bipap continue abg at 7pm - if worse intubate through CRNA or night call    D/w RN   Dr. Brand Males, M.D., [facility].C.P Pulmonary and Critical Care Medicine Staff Physician, [place] Director - Interstitial Lung Disease  Program  Pulmonary [place] [facility] [place], [place], [address]  Pager: [phone], If no answer or between  15:00h - 7:00h: call [phone] Telephone: [phone]

## 2017-11-25 NOTE — Care Management Note (Signed)
Case Management Note  Patient Details  Name: Angela Duncan MRN: [id number] Date of Birth: [birth year]  Subjective/Objective:                  Dyspnea requiring bipap  Action/Plan: Date: November 25, 2017 Velva Harman, BSN, [place], [place] Chart and notes review for patient progress and needs. Will follow for case management and discharge needs. No cm or discharge needs present at time of this review. Next review date: [id number]  Expected Discharge Date:  (unknown)               Expected Discharge Plan:  [place]  In-House Referral:     Discharge planning Services  CM Consult  Post Acute Care Choice:  Home Health, Resumption of Svcs/PTA Provider Choice offered to:  Patient  DME Arranged:    DME Agency:     HH Arranged:  PT [place]:  [place]  Status of Service:  In process, will continue to follow  If discussed at Long Length of Stay Meetings, dates discussed:    Additional Comments:  Leeroy Cha, RN 11/25/2017, 8:17 AM

## 2017-11-25 NOTE — Progress Notes (Signed)
PROGRESS NOTE  SWEDEN LESURE  LKT:[id number] DOB: [birth year] DOA: 11/24/2017 PCP: Patient, No Pcp Per  Brief Narrative: Angela Duncan is a 67 y.o. female with a history of asthma/COPD, HTN, hydrocephalus s/p VP shunt 2010, anxiety, stage 2 sacral pressure ulcer, and recent admission for hyponatremia thought to be due to HCTZ causing AMS discharged on 1/22 who presented to the ED by EMS for dyspnea associated with nonproductive cough and mild pleuritic chest pain. EMS administered oxygen, solumedrol 125mg  and nebs en route with mild improvement, though she continued to have wheezing and appeared to be in respiratory distress. ABG showed respiratory acidosis 7.299/74.3/119 on supplemental O2. Further nebulizer treatments given, pt placed on BiPAP. CXR showed emphysema without infiltrate or cardiac process. Ativan was given to accommodate BiPAP. Hospitalists consulted for admission, CCM consulted for ongoing respiratory distress. Repeat ABG requested which improved to 7.348/66.8/94.6. She is continued on IV steroids, nebulized therapy, intermittent BiPAP, and azithromycin in the SDU.  Assessment & Plan: Principal Problem:   Acute respiratory failure (HCC) Active Problems:   Hyperlipidemia LDL goal <100   Essential hypertension   COPD with asthma (HCC)   Altered mental status   Pressure injury of skin   COPD with acute exacerbation (HCC)  Acute hypoxic and hypercarbic respiratory failure: Due to COPD exacerbation. Lactic acid elevation due to respiratory distress. Lactic acid elevation cleared, likely due to respiratory distress, not sepsis.  - Continue BiPAP, appreciate PCCM assistance - Treat COPD as below.  - RVP pending, empiric droplet precautions for now.  COPD exacerbation: Followed by Dr. Luan Pulling.  - Continue IV steroids, down to 60mg  q6h  - Azithromycin 1/30 - 2/3. PCT not impressively elevated. - Bronchodilators scheduled and prn - Follow blood cultures.  - Will need repeat  PFTs/pulmonology referral at discharge.   Hypotension:  - Limit sedating medications - hold home antihypertensives. - CCM following.   Chest pain: Likely due to COPD. ECG without ST segment deviation, though precordial leads with flattened T waves. Reviewed ECG w/Dr. Domenic Polite who feels this is a very nonspecific finding. Cardiac enzymes negative.  - Checking echocardiogram as below.  Edema and elevated BNP: Though no crackles on exam, cardiomegaly or pulmonary edema on CXR.  - Check echocardiogram  Acute encephalopathy: Suspect due to respiratory distress. Previous admission had AMS due to hyponatremia, though this has resolved.  - Monitor, neuro checks - Treat w/BiPAP  Stage 2 sacral pressure ulcer: Does not appear infected - WOC consulted - Offload as able  Hypokalemia:  - Replace again and recheck in AM w/Mg, Phos  Macrocytic anemia: B12 wnl, Iron panel largely wnl. - Monitor CBC in PM with hypotension; had drop in hgb after volume resuscitation without active bleeding. Will hold pharmacologic DVT ppx.   History of hyponatremia: Resolved, thiazide was added to allergies list as this was presumed culprit  History of hydrocephalus s/p VP shunt: No neurological deficits.   DVT prophylaxis: SCDs Code Status: Full Family Communication: Sister at bedside Disposition Plan: Continue SDU Consultants: PCCM Procedures: BiPAP Antimicrobials: Azithromycin 1/30 - 2/3  Subjective: Drowsy, short sentences due to dyspnea. Still with mild chest pain.   Objective: Vitals:   11/25/17 0730 11/25/17 0751 11/25/17 0800 11/25/17 0835  BP:   (!) 118/92   Pulse:   94   Resp:   (!) 22   Temp: 98.2 F (36.8 C)     TempSrc: Axillary     SpO2:  99% 96% 94%  Weight:  Intake/Output Summary (Last 24 hours) at 11/25/2017 1206 Last data filed at 11/25/2017 0800 Gross per 24 hour  Intake 930 ml  Output 200 ml  Net 730 ml   Filed Weights   11/25/17 0458  Weight: 55.4 kg (122  lb 2.2 oz)    Gen: Frail female in no acute distress Pulm: Labored tachypnea, pursed lip breathing. Expiratory wheezes. CV: Regular rate and rhythm. No murmur, rub, or gallop. No JVD, 1+ pedal edema. GI: Abdomen soft, non-tender, non-distended, with normoactive bowel sounds. No organomegaly or masses felt. Ext: Warm, no deformities Skin: Shallow pressure ulcer with serous drainage on sacrum and very shallow wound on left lower lateral leg with bandaid covering, no discharge or odor.  Neuro: Drowsy but rousable, sparse speech. No focal neurological deficits. Psych: UTD.  Data Reviewed: I have personally reviewed following labs and imaging studies  CBC: Recent Labs  Lab 11/24/17 1609 11/25/17 0442  WBC 7.4 3.2*  NEUTROABS 6.4  --   HGB 11.4* 9.4*  HCT 34.2* 28.6*  MCV 104.6* 104.8*  PLT 417* 540   Basic Metabolic Panel: Recent Labs  Lab 11/24/17 1609 11/25/17 0442  NA 140 137  K 3.0* 3.0*  CL 97* 97*  CO2 33* 34*  GLUCOSE 179* 168*  BUN 13 15  CREATININE 0.58 0.51  CALCIUM 9.0 8.5*   GFR: Estimated Creatinine Clearance: 54.7 mL/min (by C-G formula based on SCr of 0.51 mg/dL). Liver Function Tests: Recent Labs  Lab 11/24/17 1609  AST 38  ALT 21  ALKPHOS 74  BILITOT 0.7  PROT 6.5  ALBUMIN 3.3*   No results for input(s): LIPASE, AMYLASE in the last 168 hours. No results for input(s): AMMONIA in the last 168 hours. Coagulation Profile: Recent Labs  Lab 11/24/17 1609  INR 1.00   Cardiac Enzymes: Recent Labs  Lab 11/24/17 2307 11/25/17 0442 11/25/17 1107  TROPONINI <0.03 <0.03 <0.03   BNP (last 3 results) No results for input(s): PROBNP in the last 8760 hours. HbA1C: No results for input(s): HGBA1C in the last 72 hours. CBG: No results for input(s): GLUCAP in the last 168 hours. Lipid Profile: No results for input(s): CHOL, HDL, LDLCALC, TRIG, CHOLHDL, LDLDIRECT in the last 72 hours. Thyroid Function Tests: No results for input(s): TSH, T4TOTAL,  FREET4, T3FREE, THYROIDAB in the last 72 hours. Anemia Panel: Recent Labs    11/25/17 0442  VITAMINB12 572  FOLATE 27.0  FERRITIN 191  TIBC 178*  IRON 29  RETICCTPCT 3.2*   Urine analysis:    Component Value Date/Time   COLORURINE YELLOW 11/10/2017 [place] 11/10/2017 0438   LABSPEC 1.011 11/10/2017 0438   PHURINE 6.0 11/10/2017 0438   GLUCOSEU 50 (A) 11/10/2017 0438   HGBUR SMALL (A) 11/10/2017 0438   BILIRUBINUR NEGATIVE 11/10/2017 0438   BILIRUBINUR neg 07/28/2013 0939   KETONESUR 5 (A) 11/10/2017 0438   PROTEINUR NEGATIVE 11/10/2017 0438   UROBILINOGEN 0.2 07/28/2013 0939   UROBILINOGEN 0.2 02/20/2011 1118   NITRITE NEGATIVE 11/10/2017 0438   LEUKOCYTESUR NEGATIVE 11/10/2017 0438   Recent Results (from the past 240 hour(s))  Culture, blood (routine x 2)     Status: None (Preliminary result)   Collection Time: 11/24/17  5:17 PM  Result Value Ref Range Status   Specimen Description BLOOD RIGHT ANTECUBITAL  Final   Special Requests   Final    BOTTLES DRAWN AEROBIC ONLY Blood Culture adequate volume   Culture   Final    NO GROWTH < 24  HOURS Performed at [facility], [place] [address], [place], [place] [address]    Report Status PENDING  Incomplete  Culture, blood (routine x 2)     Status: None (Preliminary result)   Collection Time: 11/24/17  5:43 PM  Result Value Ref Range Status   Specimen Description BLOOD LEFT ANTECUBITAL  Final   Special Requests   Final    BOTTLES DRAWN AEROBIC AND ANAEROBIC Blood Culture adequate volume   Culture   Final    NO GROWTH < 24 HOURS Performed at [facility], [place] [address] [place], [place] [address]    Report Status PENDING  Incomplete  MRSA PCR Screening     Status: None   Collection Time: 11/24/17 10:43 PM  Result Value Ref Range Status   MRSA by PCR NEGATIVE NEGATIVE Final    Comment:        The GeneXpert MRSA Assay (FDA approved for NASAL specimens only), is one component of  a comprehensive MRSA colonization surveillance program. It is not intended to diagnose MRSA infection nor to guide or monitor treatment for MRSA infections.       Radiology Studies: Dg Chest Port 1 View  Result Date: 11/24/2017 CLINICAL DATA:  Shortness of breath. EXAM: PORTABLE CHEST 1 VIEW COMPARISON:  11/15/2017 and 11/10/2017 and 11/20/2013 FINDINGS: The lungs are hyperinflated with flattening of the diaphragm consistent with emphysema. No acute infiltrates or effusions. Heart size is normal. Aortic atherosclerosis. Pulmonary vascularity is within normal limits. IMPRESSION: 1. Emphysema. 2. Aortic atherosclerosis. 3. No acute abnormalities. Electronically Signed   By: Lorriane Shire M.D.   On: 11/24/2017 16:08    Scheduled Meds: . aspirin EC  81 mg Oral Daily  . budesonide (PULMICORT) nebulizer solution  0.5 mg Nebulization BID  . enoxaparin (LOVENOX) injection  40 mg Subcutaneous QHS  . ipratropium-albuterol  3 mL Nebulization Q4H  . methylPREDNISolone (SOLU-MEDROL) injection  80 mg Intravenous Q6H  . potassium chloride  10 mEq Oral Daily  . pravastatin  40 mg Oral q1800   Continuous Infusions: . azithromycin Stopped (11/25/17 0124)  . potassium chloride 10 mEq (11/25/17 1121)     LOS: 1 day   Time spent: 35 minutes.  Vance Gather, MD [facility] Pager [phone]  If 7PM-7AM, please contact night-coverage [email or web address] Password [facility] 11/25/2017, 12:06 PM

## 2017-11-25 NOTE — Progress Notes (Signed)
Pt currently on BIPAP. Pt has flutter valve at bedside.

## 2017-11-26 ENCOUNTER — Inpatient Hospital Stay (HOSPITAL_COMMUNITY): Payer: Medicare HMO

## 2017-11-26 DIAGNOSIS — J9600 Acute respiratory failure, unspecified whether with hypoxia or hypercapnia: Secondary | ICD-10-CM

## 2017-11-26 DIAGNOSIS — J9622 Acute and chronic respiratory failure with hypercapnia: Secondary | ICD-10-CM

## 2017-11-26 DIAGNOSIS — B974 Respiratory syncytial virus as the cause of diseases classified elsewhere: Secondary | ICD-10-CM

## 2017-11-26 DIAGNOSIS — R579 Shock, unspecified: Secondary | ICD-10-CM

## 2017-11-26 DIAGNOSIS — R64 Cachexia: Secondary | ICD-10-CM

## 2017-11-26 DIAGNOSIS — Z452 Encounter for adjustment and management of vascular access device: Secondary | ICD-10-CM

## 2017-11-26 DIAGNOSIS — J9621 Acute and chronic respiratory failure with hypoxia: Secondary | ICD-10-CM

## 2017-11-26 DIAGNOSIS — I361 Nonrheumatic tricuspid (valve) insufficiency: Secondary | ICD-10-CM

## 2017-11-26 LAB — PHOSPHORUS
PHOSPHORUS: 2.4 mg/dL — AB (ref 2.5–4.6)
Phosphorus: 2.6 mg/dL (ref 2.5–4.6)
Phosphorus: 1.6 mg/dL — ABNORMAL LOW (ref 2.5–4.6)

## 2017-11-26 LAB — BLOOD GAS, ARTERIAL
ACID-BASE EXCESS: 7.9 mmol/L — AB (ref 0.0–2.0)
Acid-Base Excess: 6.9 mmol/L — ABNORMAL HIGH (ref 0.0–2.0)
BICARBONATE: 32.4 mmol/L — AB (ref 20.0–28.0)
Bicarbonate: 35.6 mmol/L — ABNORMAL HIGH (ref 20.0–28.0)
DELIVERY SYSTEMS: POSITIVE
Drawn by: 103701
Drawn by: 270211
Expiratory PAP: 5
FIO2: 40
FIO2: 1
INSPIRATORY PAP: 14
LHR: 18 {breaths}/min
LHR: 14 {breaths}/min
Mode: POSITIVE
O2 SAT: 97.8 %
PCO2 ART: 81.3 mmHg — AB (ref 32.0–48.0)
PEEP/CPAP: 5 cmH2O
Patient temperature: 98.6
Patient temperature: 98.6
VT: 420 mL
pCO2 arterial: 46.4 mmHg (ref 32.0–48.0)
pH, Arterial: 7.264 — ABNORMAL LOW (ref 7.350–7.450)
pH, Arterial: 7.459 — ABNORMAL HIGH (ref 7.350–7.450)
pO2, Arterial: 108 mmHg (ref 83.0–108.0)
pO2, Arterial: 412 mmHg — ABNORMAL HIGH (ref 83.0–108.0)

## 2017-11-26 LAB — CBC
HCT: 33.2 % — ABNORMAL LOW (ref 36.0–46.0)
Hemoglobin: 10.7 g/dL — ABNORMAL LOW (ref 12.0–15.0)
MCH: 34.6 pg — AB (ref 26.0–34.0)
MCHC: 32.2 g/dL (ref 30.0–36.0)
MCV: 107.4 fL — AB (ref 78.0–100.0)
PLATELETS: 278 10*3/uL (ref 150–400)
RBC: 3.09 MIL/uL — ABNORMAL LOW (ref 3.87–5.11)
RDW: 14.1 % (ref 11.5–15.5)
WBC: 6.9 10*3/uL (ref 4.0–10.5)

## 2017-11-26 LAB — PROCALCITONIN: PROCALCITONIN: 0.28 ng/mL

## 2017-11-26 LAB — BASIC METABOLIC PANEL
ANION GAP: 17 — AB (ref 5–15)
BUN: 18 mg/dL (ref 6–20)
CALCIUM: 8.8 mg/dL — AB (ref 8.9–10.3)
CO2: 24 mmol/L (ref 22–32)
Chloride: 102 mmol/L (ref 101–111)
Creatinine, Ser: 0.46 mg/dL (ref 0.44–1.00)
GFR calc Af Amer: >60 mL/min (ref >60)
Glucose, Bld: 121 mg/dL — ABNORMAL HIGH (ref 65–99)
POTASSIUM: 4.6 mmol/L (ref 3.5–5.1)
Sodium: 143 mmol/L (ref 135–145)

## 2017-11-26 LAB — MAGNESIUM
MAGNESIUM: 2.1 mg/dL (ref 1.7–2.4)
Magnesium: 2.3 mg/dL (ref 1.7–2.4)
Magnesium: 2.2 mg/dL (ref 1.7–2.4)

## 2017-11-26 LAB — TRIGLYCERIDES: TRIGLYCERIDES: 127 mg/dL (ref <150)

## 2017-11-26 LAB — GLUCOSE, CAPILLARY: Glucose-Capillary: 185 mg/dL — ABNORMAL HIGH (ref 65–99)

## 2017-11-26 LAB — ECHOCARDIOGRAM COMPLETE
HEIGHTINCHES: 62 in
WEIGHTICAEL: 1968.27 [oz_av]

## 2017-11-26 MED ORDER — MIDAZOLAM HCL 2 MG/2ML IJ SOLN
1.0000 mg | Freq: Once | INTRAMUSCULAR | Status: AC
  Administered 2017-11-26: 1 mg via INTRAVENOUS

## 2017-11-26 MED ORDER — PRO-STAT SUGAR FREE PO LIQD
30.0000 mL | Freq: Two times a day (BID) | ORAL | Status: DC
  Filled 2017-11-26: qty 30

## 2017-11-26 MED ORDER — FREE WATER
50.0000 mL | Status: DC
  Administered 2017-11-26 – 2017-11-30 (×25): 50 mL

## 2017-11-26 MED ORDER — DOCUSATE SODIUM 50 MG/5ML PO LIQD
100.0000 mg | Freq: Two times a day (BID) | ORAL | Status: DC | PRN
  Administered 2017-11-28: 100 mg
  Filled 2017-11-26 (×2): qty 10

## 2017-11-26 MED ORDER — ETOMIDATE 2 MG/ML IV SOLN
20.0000 mg | Freq: Once | INTRAVENOUS | Status: AC
  Administered 2017-11-26: 20 mg via INTRAVENOUS

## 2017-11-26 MED ORDER — FENTANYL CITRATE (PF) 100 MCG/2ML IJ SOLN
100.0000 ug | Freq: Once | INTRAMUSCULAR | Status: AC
  Administered 2017-11-26: 100 ug via INTRAVENOUS

## 2017-11-26 MED ORDER — MIDAZOLAM HCL 2 MG/2ML IJ SOLN
INTRAMUSCULAR | Status: AC
  Filled 2017-11-26: qty 2

## 2017-11-26 MED ORDER — VITAL AF 1.2 CAL PO LIQD
1000.0000 mL | ORAL | Status: DC
  Administered 2017-11-26 – 2017-11-28 (×3): 1000 mL
  Filled 2017-11-26 (×4): qty 1000

## 2017-11-26 MED ORDER — FENTANYL CITRATE (PF) 100 MCG/2ML IJ SOLN
50.0000 ug | INTRAMUSCULAR | Status: AC | PRN
  Administered 2017-11-26 – 2017-11-29 (×3): 50 ug via INTRAVENOUS
  Filled 2017-11-26 (×3): qty 2

## 2017-11-26 MED ORDER — ADULT MULTIVITAMIN LIQUID CH
15.0000 mL | Freq: Every day | ORAL | Status: DC
  Administered 2017-11-26 – 2017-11-30 (×5): 15 mL
  Filled 2017-11-26 (×6): qty 15

## 2017-11-26 MED ORDER — INSULIN ASPART 100 UNIT/ML ~~LOC~~ SOLN
2.0000 [IU] | SUBCUTANEOUS | Status: DC
  Administered 2017-11-26 – 2017-11-27 (×3): 4 [IU] via SUBCUTANEOUS
  Administered 2017-11-27: 2 [IU] via SUBCUTANEOUS
  Administered 2017-11-27: 6 [IU] via SUBCUTANEOUS
  Administered 2017-11-27 – 2017-11-28 (×5): 4 [IU] via SUBCUTANEOUS
  Administered 2017-11-28 (×2): 2 [IU] via SUBCUTANEOUS
  Administered 2017-11-28 – 2017-11-29 (×2): 4 [IU] via SUBCUTANEOUS
  Administered 2017-11-29: 2 [IU] via SUBCUTANEOUS
  Administered 2017-11-29 – 2017-11-30 (×5): 4 [IU] via SUBCUTANEOUS
  Administered 2017-11-30: 2 [IU] via SUBCUTANEOUS
  Administered 2017-11-30: 4 [IU] via SUBCUTANEOUS

## 2017-11-26 MED ORDER — VITAL HIGH PROTEIN PO LIQD
1000.0000 mL | ORAL | Status: DC
  Filled 2017-11-26: qty 1000

## 2017-11-26 MED ORDER — PROPOFOL 1000 MG/100ML IV EMUL
0.0000 ug/kg/min | INTRAVENOUS | Status: DC
  Administered 2017-11-26: 50 ug/kg/min via INTRAVENOUS
  Administered 2017-11-26: 10 ug/kg/min via INTRAVENOUS
  Administered 2017-11-27 – 2017-11-28 (×6): 50 ug/kg/min via INTRAVENOUS
  Filled 2017-11-26 (×8): qty 100

## 2017-11-26 MED ORDER — ROCURONIUM BROMIDE 50 MG/5ML IV SOLN
40.0000 mg | Freq: Once | INTRAVENOUS | Status: AC
  Administered 2017-11-26: 40 mg via INTRAVENOUS

## 2017-11-26 MED ORDER — NOREPINEPHRINE BITARTRATE 1 MG/ML IV SOLN
0.0000 ug/min | INTRAVENOUS | Status: DC
  Administered 2017-11-26: 8 ug/min via INTRAVENOUS
  Filled 2017-11-26: qty 16

## 2017-11-26 MED ORDER — FENTANYL CITRATE (PF) 100 MCG/2ML IJ SOLN
INTRAMUSCULAR | Status: AC
  Filled 2017-11-26: qty 2

## 2017-11-26 MED ORDER — FENTANYL CITRATE (PF) 100 MCG/2ML IJ SOLN
50.0000 ug | INTRAMUSCULAR | Status: DC | PRN
  Administered 2017-11-26 – 2017-11-29 (×5): 50 ug via INTRAVENOUS
  Filled 2017-11-26 (×5): qty 2

## 2017-11-26 MED ORDER — PANTOPRAZOLE SODIUM 40 MG IV SOLR
40.0000 mg | Freq: Every day | INTRAVENOUS | Status: DC
  Administered 2017-11-26 – 2017-11-27 (×2): 40 mg via INTRAVENOUS
  Filled 2017-11-26 (×2): qty 40

## 2017-11-26 NOTE — Procedures (Signed)
Intubation Procedure Note Angela Duncan [id number] 05-07-1951  Procedure: Intubation Indications: Respiratory insufficiency  Procedure Details Consent: Unable to obtain consent because of emergent medical necessity. Time Out: Verified patient identification, verified procedure, site/side was marked, verified correct patient position, special equipment/implants available, medications/allergies/relevent history reviewed, required imaging and test results available.  Performed  Maximum sterile technique was used including antiseptics, gloves, gown, hand hygiene and mask.  MAC and 3    Evaluation Hemodynamic Status: BP stable throughout; O2 sats: stable throughout Patient's Current Condition: stable Complications: No apparent complications Patient did tolerate procedure well. Chest X-ray ordered to verify placement.  CXR: pending.   Clementeen Graham 11/26/2017 Erick Colace ACNP-BC [place] Pager # [phone] OR # [phone] if no answer

## 2017-11-26 NOTE — Progress Notes (Signed)
 Pulmonary Critical Care afternoon follow-up     Interval/events   Intubated earlier this a.m. due to progressive hypercarbia, decreased mental status, and worsening work of breathing Hypotension, suspect medication induced Family now at bedside   Vitals/hemodynamics/gtts   BP 122/70   Pulse 79   Temp (!) 97.2 F (36.2 C) (Axillary)   Resp (!) 28   Ht 5\' 2"  (1.575 m)   Wt 123 lb 0.3 oz (55.8 kg)   SpO2 100%   BMI 22.50 kg/m    . azithromycin Stopped (11/25/17 2223)  . norepinephrine (LEVOPHED) Adult infusion    . propofol (DIPRIVAN) infusion      Intake/Output Summary (Last 24 hours) at 11/26/2017 1253 Last data filed at 11/26/2017 0600 Gross per 24 hour  Intake 397.58 ml  Output 600 ml  Net -202.42 ml    Physical exam   Frail 67 year old female now sedated on ventilator HEENT: Orally intubated, and edentulous, mucous membranes moist Pulmonary diffuse wheeze some accessory muscle use Cardiac regular rate and rhythm Neuro/psych: Heavily sedated however will reach out and sit up at times  Recent Labs  Lab 11/24/17 1609 11/25/17 0442 11/26/17 0301  NA 140 137 143  K 3.0* 3.0* 4.6  CL 97* 97* 102  CO2 33* 34* 24  BUN 13 15 18   CREATININE 0.58 0.51 0.46  GLUCOSE 179* 168* 121*   Recent Labs  Lab 11/25/17 0442 11/25/17 1241 11/26/17 0301  HGB 9.4* 8.8* 10.7*  HCT 28.6* 26.4* 33.2*  WBC 3.2* 6.8 6.9  PLT 308 292 278   ABG    Component Value Date/Time   PHART 7.459 (H) 11/26/2017 1130   PCO2ART 46.4 11/26/2017 1130   PO2ART 412 (H) 11/26/2017 1130   HCO3 32.4 (H) 11/26/2017 1130   TCO2 32 02/21/2009 1226   O2SAT above reportable range 11/26/2017 1130    Impression/plan   Acute on chronic hypoxic and hypercarbic respiratory failure in the setting of RSV infection and resultant acute exacerbation of chronic obstructive pulmonary disease Medication induced hypotension Acute encephalopathy Chronic anemia   Discussion Her sister and niece  were at bedside, they are her next of kin.  We discussed the events of the morning, what brought her here to the hospital, the plan of care for the next 24-48 hours, and my concern about the patient's baseline poor health given her prior comorbidities.  The plan at this point is to continue aggressive medical therapy.  This will include mechanical ventilation, systemic steroids, inhaled bronchodilators, treatment of infection, and sedation to facilitate mechanical ventilation.  We have also placed a central venous catheter so that we can administer vasoactive drips.  I have advised the patient's family CPR in the setting is often futile and reflects failure of her current therapy.  Based on this we will will make her a limited resuscitation, with plan being aggressive medical therapy including vasoactive drips however should she suffer a cardiac arrest we would not provide CPR nor acute life support measures  Plan Continue full ventilator support, I have decreased her minute ventilation to  avoid auto PEEP Continue PAD protocol, changing Precedex to propofol, RASS goal -2 Continue systemic steroids, inhaled bronchodilators, and empiric antibiotics We will transduce central venous pressures an effort to ensure adequate volume resuscitation Add norepinephrine to maintain mean arterial pressure greater than 65 Limited CODE status Family updated and full   CCT:35  Erick Colace ACNP-BC [place] Pager # [phone] OR # [phone] if no answer

## 2017-11-26 NOTE — Progress Notes (Addendum)
.. ..  Name: Angela Duncan MRN: [id number] DOB: Oct 19, 1951    ADMISSION DATE:  11/24/2017 CONSULTATION DATE: 11/24/2017  REFERRING MD : [facility]- GRUNZ MD  CHIEF COMPLAINT: Shortness of breath  BRIEF PATIENT DESCRIPTION: 67 year old female with a past medical history of asthma, COPD, hypertension, status post VP shunt in 2010 for hydrocephalus, anxiety, stage II sacral pressure ulcer presents with dyspnea found to be in acute exacerbation of COPD guarded on noninvasive positive pressure ventilation.  P CCM consulted Due to concern of mental status/somnolence while on BiPAP.   SIGNIFICANT EVENTS  Dyspnea requiring BiPAP  STUDIES:  Chest x-ray: Typical features of emphysema including: Hyperinflated lung fields, thin mediastinum, flattening of diaphragms.  There are no focal consolidations, and no signs of fluid overload.  ABG: Initial 7.29/74.3/119 improved to 7.34/60 6/94 Lactic acid 2.22    SUBJECTIVE:  Work of breathing worse in spite of over 24 hours of BiPAP worsening respiratory efforts VITAL SIGNS: Temp:  [97.5 F (36.4 C)-98 F (36.7 C)] 97.6 F (36.4 C) (02/01 0300) Pulse Rate:  [65-108] 79 (02/01 0800) Resp:  [20-38] 26 (02/01 0800) BP: (79-137)/(28-96) 113/56 (02/01 0800) SpO2:  [84 %-100 %] 100 % (02/01 0810) FiO2 (%):  [30 %-40 %] 40 % (02/01 0810) Weight:  [122 lb 2.2 oz (55.4 kg)-123 lb 0.3 oz (55.8 kg)] 123 lb 0.3 oz (55.8 kg) (02/01 0500)  PHYSICAL EXAMINATION: General: 67 year old female, she appears much older than her stated age.  She is in acute  distress in spite of BiPAP support with marked accessory muscle use and worsening air movement HEENT: BiPAP mask in place mucous membranes dry neck veins flat Pulmonary: Diffuse wheezing with increased accessory use, air movement actually worse when compared to day prior Cardiac: Regular rate and rhythm without murmur rub or gallop Abdomen: Soft nontender no organomegaly Extremities/musculoskeletal: No  significant edema brisk cap refill strong pulses Neuro/psych.  More obtunded today, will open her eyes briefly   Recent Labs  Lab 11/24/17 1609 11/25/17 0442  NA 140 137  K 3.0* 3.0*  CL 97* 97*  CO2 33* 34*  BUN 13 15  CREATININE 0.58 0.51  GLUCOSE 179* 168*   Recent Labs  Lab 11/25/17 0442 11/25/17 1241 11/26/17 0301  HGB 9.4* 8.8* 10.7*  HCT 28.6* 26.4* 33.2*  WBC 3.2* 6.8 6.9  PLT 308 292 278   ABG    Component Value Date/Time   PHART 7.264 (L) 11/26/2017 0843   PCO2ART 81.3 (HH) 11/26/2017 0843   PO2ART 108 11/26/2017 0843   HCO3 35.6 (H) 11/26/2017 0843   TCO2 32 02/21/2009 1226   O2SAT 97.8 11/26/2017 0843   Dg Chest Port 1 View  Result Date: 11/24/2017 CLINICAL DATA:  Shortness of breath. EXAM: PORTABLE CHEST 1 VIEW COMPARISON:  11/15/2017 and 11/10/2017 and 11/20/2013 FINDINGS: The lungs are hyperinflated with flattening of the diaphragm consistent with emphysema. No acute infiltrates or effusions. Heart size is normal. Aortic atherosclerosis. Pulmonary vascularity is within normal limits. IMPRESSION: 1. Emphysema. 2. Aortic atherosclerosis. 3. No acute abnormalities. Electronically Signed   By: Lorriane Shire M.D.   On: 11/24/2017 16:08    ASSESSMENT / PLAN:   Acute on chronic hypercarbic & hypoxic respiratory failure in setting of AECOPD-->exacerbated by RSV  -work of breathing still very high w/ marked accessory efforts.  -repeat ABG worse -BIPAP all night and not really looking better. Air movement seems worse on exam Plan Cont BIPAP for now. Will d/w  family.She looks like she has  very limited respiratory reserves so once intubated may be hard to get off vent. Otherwise transition to comfort.  Cont day 3/5 azith Cont steroids 80 q 6 Cont duoneb q4 Cont budesonide Cont resp isolation   Acute encephalopathy; presumed mix of History of hypercarbia/hypoxia and metabolic in origin. Superimpose on h/o hydrocephalus status post VP shunt -got worse on  1/31 & placed on precedex. A little more heavily sedated then I would like to see her today 2/1 Plan Repeat abg assess for progressive hypercarbia  Cont Precedex but goal is RAS goal of -1 Hold other sedating meds   Presumed sepsis; LA cleared. Alternatively LA could have just represented WOB Plan Cont MIVFs Cont tele  Cont abx (as above)  Fluid and Electrolyte imbalance: Hypokalemia  Plan Replace and recheck as indicated   Anemia (macrocytic) Plan Trend CBC Transfuse per protocol   Stage II sacral ulcer  Plan Per [facility] team    Hyperglycemia Plan Continue sliding scale insulin  My cct 40 min   Erick Colace ACNP-BC [place] Pager # [phone] OR # [phone] if no answer

## 2017-11-26 NOTE — Procedures (Signed)
Central Venous Catheter Insertion Procedure Note EMYAH ROZNOWSKI [id number] 1951/05/25  Procedure: Insertion of Central Venous Catheter Indications: Assessment of intravascular volume, Drug and/or fluid administration and Frequent blood sampling  Procedure Details Consent: Risks of procedure as well as the alternatives and risks of each were explained to the (patient/caregiver).  Consent for procedure obtained. Time Out: Verified patient identification, verified procedure, site/side was marked, verified correct patient position, special equipment/implants available, medications/allergies/relevent history reviewed, required imaging and test results available.  Performed Real time [place] was used to ID and cannulate vessel  Maximum sterile technique was used including antiseptics, cap, gloves, gown, hand hygiene, mask and sheet. Skin prep: Chlorhexidine; local anesthetic administered A antimicrobial bonded/coated triple lumen catheter was placed in the right internal jugular vein using the Seldinger technique.  Evaluation Blood flow good Complications: No apparent complications Patient did tolerate procedure well. Chest X-ray ordered to verify placement.  CXR: pending.  Clementeen Graham 11/26/2017, 12:52 PM  Erick Colace ACNP-BC [place] Pager # [phone] OR # [phone] if no answer

## 2017-11-26 NOTE — Progress Notes (Signed)
  Echocardiogram 2D Echocardiogram has been performed.  Darlina Sicilian M 11/26/2017, 11:53 AM

## 2017-11-26 NOTE — Progress Notes (Signed)
NUTRITION NOTE  RN alerted RD that pt was started on Propofol (6.6 mL/hr which provides 174 kcal). This + kcal from TF regimen will provide 109% estimated nutrition needs. Will continue current order (Vital AF 1.2 @ 40 mL/hr) and will continue to monitor changes in Propofol rate.      Jarome Matin, MS, RD, LDN, [facility] Inpatient Clinical Dietitian Pager # [phone] After hours/weekend pager # [phone]

## 2017-11-26 NOTE — Progress Notes (Signed)
PROGRESS NOTE  [name]  TKP:[id number] DOB: 12-25-1950 DOA: 11/24/2017 PCP: Patient, No Pcp Per  Brief Narrative: Angela Duncan is a 67 y.o. female with a history of asthma/COPD, HTN, hydrocephalus s/p VP shunt 2010, anxiety, stage 2 sacral pressure ulcer, and recent admission for hyponatremia thought to be due to HCTZ causing AMS discharged on 1/22 who presented to the ED by EMS for dyspnea associated with nonproductive cough and mild pleuritic chest pain. EMS administered oxygen, solumedrol 125mg  and nebs en route with mild improvement, though she continued to have wheezing and appeared to be in respiratory distress. ABG showed respiratory acidosis 7.299/74.3/119 on supplemental O2. Further nebulizer treatments given, pt placed on BiPAP. CXR showed emphysema without infiltrate or cardiac process. Ativan was given to accommodate BiPAP. Hospitalists consulted for admission, CCM consulted for ongoing respiratory distress. Repeat ABG requested which improved to 7.348/66.8/94.6. She is continued on IV steroids, nebulized therapy, intermittent BiPAP, and azithromycin in the SDU.  Assessment & Plan: Principal Problem:   Acute respiratory failure (HCC) Active Problems:   Hyperlipidemia LDL goal <100   Essential hypertension   COPD with asthma (HCC)   Altered mental status   Pressure injury of skin   COPD with acute exacerbation (HCC)  Acute hypoxic and hypercarbic respiratory failure/ acute on chronic COPD exacerbation: lactic acid elevation cleared, likely due to respiratory distress, not sepsis. + for RSV by PCR.  Not looking any better today, maybe worse, still requiring bipap support and not moving much air.  Discussed w PA for CCM, they will d/w family as to possible intubation.  - cont IV steroids, nebs, bipap, IV azithro - check ABG   Hypotension:  - Limit sedating medications - holding home antihypertensives. - CCM following.   Chest pain: Likely due to COPD. ECG without ST  segment deviation, though precordial leads with flattened T waves. Reviewed ECG w/Dr. Domenic Polite who feels this is a very nonspecific finding. Cardiac enzymes negative.  - Checking echocardiogram as below.  Edema and elevated BNP: Though no crackles on exam, cardiomegaly or pulmonary edema on CXR.  - Check echocardiogram  Acute encephalopathy: Suspect due to respiratory distress. Previous admission had AMS due to hyponatremia, though this has resolved.  - monitor - Treat w/BiPAP  Stage 2 sacral pressure ulcer: Does not appear infected - WOC consulted - Offload as able  Hypokalemia:  - replacing, repeat pending this am  Macrocytic anemia: B12 wnl, Iron panel largely wnl. - Monitor CBC in PM with hypotension; had drop in hgb after volume resuscitation without active bleeding. Will hold pharmacologic DVT ppx.   History of hyponatremia: Resolved, thiazide was added to allergies list as this was presumed culprit  History of hydrocephalus s/p VP shunt: No neurological deficits.   DVT prophylaxis: SCDs Code Status: Full Family Communication: no family here, CCM to d/w family as to poss intubation, etc Disposition Plan: Continue SDU Consultants: PCCM  Procedures: BiPAP Antimicrobials: Azithromycin 1/30 - 2/3  Subjective: Drowsy, on bipap, not verbalizing  Objective: Vitals:   11/26/17 0400 11/26/17 0402 11/26/17 0500 11/26/17 0600  BP: (!) 104/52  125/66 (!) 103/55  Pulse: 65  80 67  Resp: (!) 23  (!) 28 (!) 25  Temp:      TempSrc:      SpO2: 99% 99% 100% 100%  Weight:   55.8 kg (123 lb 0.3 oz)   Height:        Intake/Output Summary (Last 24 hours) at 11/26/2017 5170 Last data filed at  11/26/2017 0600 Gross per 24 hour  Intake 497.58 ml  Output 600 ml  Net -102.42 ml   Filed Weights   11/25/17 0458 11/25/17 1439 11/26/17 0500  Weight: 55.4 kg (122 lb 2.2 oz) 55.4 kg (122 lb 2.2 oz) 55.8 kg (123 lb 0.3 oz)    Gen: Frail female in no acute distress Pulm: not moving  much here, no gross wheezing noted, use of acc muscles+ CV: Regular rate and rhythm. No murmur, rub, or gallop. No JVD, 1+ pedal edema. GI: Abdomen soft, non-tender, non-distended, with normoactive bowel sounds. No organomegaly or masses felt. Ext: Warm, no deformities Skin: Shallow pressure ulcer with serous drainage on sacrum Neuro: Drowsy, as above. No focal neurological deficits. Psych: UTD.  Data Reviewed: I have personally reviewed following labs and imaging studies  CBC: Recent Labs  Lab 11/24/17 1609 11/25/17 0442 11/25/17 1241 11/26/17 0301  WBC 7.4 3.2* 6.8 6.9  NEUTROABS 6.4  --   --   --   HGB 11.4* 9.4* 8.8* 10.7*  HCT 34.2* 28.6* 26.4* 33.2*  MCV 104.6* 104.8* 105.2* 107.4*  PLT 417* 308 292 130   Basic Metabolic Panel: Recent Labs  Lab 11/24/17 1609 11/25/17 0442 11/26/17 0301  NA 140 137  --   K 3.0* 3.0*  --   CL 97* 97*  --   CO2 33* 34*  --   GLUCOSE 179* 168*  --   BUN 13 15  --   CREATININE 0.58 0.51  --   CALCIUM 9.0 8.5*  --   MG  --   --  2.1  PHOS  --   --  2.6   GFR: Estimated Creatinine Clearance: 54.7 mL/min (by C-G formula based on SCr of 0.51 mg/dL). Liver Function Tests: Recent Labs  Lab 11/24/17 1609  AST 38  ALT 21  ALKPHOS 74  BILITOT 0.7  PROT 6.5  ALBUMIN 3.3*   No results for input(s): LIPASE, AMYLASE in the last 168 hours. No results for input(s): AMMONIA in the last 168 hours. Coagulation Profile: Recent Labs  Lab 11/24/17 1609  INR 1.00   Cardiac Enzymes: Recent Labs  Lab 11/24/17 2307 11/25/17 0442 11/25/17 1107  TROPONINI <0.03 <0.03 <0.03   BNP (last 3 results) No results for input(s): PROBNP in the last 8760 hours. HbA1C: No results for input(s): HGBA1C in the last 72 hours. CBG: No results for input(s): GLUCAP in the last 168 hours. Lipid Profile: No results for input(s): CHOL, HDL, LDLCALC, TRIG, CHOLHDL, LDLDIRECT in the last 72 hours. Thyroid Function Tests: No results for input(s): TSH,  T4TOTAL, FREET4, T3FREE, THYROIDAB in the last 72 hours. Anemia Panel: Recent Labs    11/25/17 0442  VITAMINB12 572  FOLATE 27.0  FERRITIN 191  TIBC 178*  IRON 29  RETICCTPCT 3.2*   Urine analysis:    Component Value Date/Time   COLORURINE YELLOW 11/10/2017 [place] 11/10/2017 0438   LABSPEC 1.011 11/10/2017 0438   PHURINE 6.0 11/10/2017 0438   GLUCOSEU 50 (A) 11/10/2017 0438   HGBUR SMALL (A) 11/10/2017 0438   BILIRUBINUR NEGATIVE 11/10/2017 0438   BILIRUBINUR neg 07/28/2013 0939   KETONESUR 5 (A) 11/10/2017 0438   PROTEINUR NEGATIVE 11/10/2017 0438   UROBILINOGEN 0.2 07/28/2013 0939   UROBILINOGEN 0.2 02/20/2011 1118   NITRITE NEGATIVE 11/10/2017 0438   LEUKOCYTESUR NEGATIVE 11/10/2017 0438   Recent Results (from the past 240 hour(s))  Culture, blood (routine x 2)     Status: None (Preliminary result)  Collection Time: 11/24/17  5:17 PM  Result Value Ref Range Status   Specimen Description BLOOD RIGHT ANTECUBITAL  Final   Special Requests   Final    BOTTLES DRAWN AEROBIC ONLY Blood Culture adequate volume   Culture   Final    NO GROWTH < 24 HOURS Performed at [facility], [address] [place], [place] [address]    Report Status PENDING  Incomplete  Culture, blood (routine x 2)     Status: None (Preliminary result)   Collection Time: 11/24/17  5:43 PM  Result Value Ref Range Status   Specimen Description BLOOD LEFT ANTECUBITAL  Final   Special Requests   Final    BOTTLES DRAWN AEROBIC AND ANAEROBIC Blood Culture adequate volume   Culture   Final    NO GROWTH < 24 HOURS Performed at [facility], [place] [address] [place], [place] [address]    Report Status PENDING  Incomplete  Respiratory Panel by PCR     Status: Abnormal   Collection Time: 11/24/17 10:43 PM  Result Value Ref Range Status   Adenovirus NOT DETECTED NOT DETECTED Final   Coronavirus 229E NOT DETECTED NOT DETECTED Final   Coronavirus HKU1 NOT DETECTED NOT DETECTED  Final   Coronavirus NL63 NOT DETECTED NOT DETECTED Final   Coronavirus OC43 NOT DETECTED NOT DETECTED Final   Metapneumovirus NOT DETECTED NOT DETECTED Final   Rhinovirus / Enterovirus NOT DETECTED NOT DETECTED Final   Influenza A NOT DETECTED NOT DETECTED Final   Influenza B NOT DETECTED NOT DETECTED Final   Parainfluenza Virus 1 NOT DETECTED NOT DETECTED Final   Parainfluenza Virus 2 NOT DETECTED NOT DETECTED Final   Parainfluenza Virus 3 NOT DETECTED NOT DETECTED Final   Parainfluenza Virus 4 NOT DETECTED NOT DETECTED Final   Respiratory Syncytial Virus DETECTED (A) NOT DETECTED Final    Comment: CRITICAL RESULT CALLED TO, READ BACK BY AND VERIFIED WITH: Sharyn Creamer RN 13:25 11/25/17 ([email or web address])    Bordetella pertussis NOT DETECTED NOT DETECTED Final   Chlamydophila pneumoniae NOT DETECTED NOT DETECTED Final   Mycoplasma pneumoniae NOT DETECTED NOT DETECTED Final    Comment: Performed at [facility], [address] [place], [place] [address]  MRSA PCR Screening     Status: None   Collection Time: 11/24/17 10:43 PM  Result Value Ref Range Status   MRSA by PCR NEGATIVE NEGATIVE Final    Comment:        The GeneXpert MRSA Assay (FDA approved for NASAL specimens only), is one component of a comprehensive MRSA colonization surveillance program. It is not intended to diagnose MRSA infection nor to guide or monitor treatment for MRSA infections.       Radiology Studies: Dg Chest Port 1 View  Result Date: 11/24/2017 CLINICAL DATA:  Shortness of breath. EXAM: PORTABLE CHEST 1 VIEW COMPARISON:  11/15/2017 and 11/10/2017 and 11/20/2013 FINDINGS: The lungs are hyperinflated with flattening of the diaphragm consistent with emphysema. No acute infiltrates or effusions. Heart size is normal. Aortic atherosclerosis. Pulmonary vascularity is within normal limits. IMPRESSION: 1. Emphysema. 2. Aortic atherosclerosis. 3. No acute abnormalities. Electronically Signed   By: Lorriane Shire M.D.    On: 11/24/2017 16:08    Scheduled Meds: . aspirin EC  81 mg Oral Daily  . budesonide (PULMICORT) nebulizer solution  0.5 mg Nebulization BID  . enoxaparin (LOVENOX) injection  40 mg Subcutaneous QHS  . ipratropium-albuterol  3 mL Nebulization Q4H  . methylPREDNISolone (SOLU-MEDROL)  injection  80 mg Intravenous Q6H  . potassium chloride  10 mEq Oral Daily  . pravastatin  40 mg Oral q1800   Continuous Infusions: . azithromycin Stopped (11/25/17 2223)  . dexmedetomidine (PRECEDEX) IV infusion 0.7 mcg/kg/hr (11/26/17 0600)     LOS: 2 days   Time spent: 35 minutes.  Kelly Splinter MD [facility] pgr [phone] 11/26/2017, 8:18 AM  If 7PM-7AM, please contact night-coverage [email or web address] Password [email or web address] 11/26/2017, 8:12 AM

## 2017-11-26 NOTE — Progress Notes (Signed)
Initial Nutrition Assessment  DOCUMENTATION CODES:   Not applicable  INTERVENTION:  Will order Vital AF 1.2 @ 40 mL/hr with 50 mL free water every 4 hours which will provide 1152 kcal (95% estimated kcal need), 72 grams of protein, and 1078 mL free water.   NUTRITION DIAGNOSIS:   Inadequate oral intake related to inability to eat as evidenced by NPO status.  GOAL:   Patient will meet greater than or equal to 90% of their needs  MONITOR:   Vent status, TF tolerance, Weight trends, Skin  REASON FOR ASSESSMENT:   Ventilator, Consult Enteral/tube feeding initiation and management  ASSESSMENT:   67 y.o. female with a history of asthma/COPD, HTN, hydrocephalus s/p VP shunt 2010, anxiety, stage 2 sacral pressure ulcer, and recent admission for hyponatremia thought to be due to HCTZ causing AMS discharged on 1/22 who presented to the ED by EMS for dyspnea associated with nonproductive cough and mild pleuritic chest pain.   Pt was intubated and OGT placed ~10:30 AM today 2/2 acute respiratory failure. Family was arriving as RD was finishing up in the room and RN, who was at bedside, was updating family on acute changes in medical status, need for intubation. Unable to obtain information from family at this time.   Patient is currently intubated on ventilator support MV: 10.2 L/min Temp (24hrs), Avg:97.6 F (36.4 C), Min:97.2 F (36.2 C), Max:98 F (36.7 C) Propofol: none BP: 159/94 and MAP: 114  Medications reviewed; 80 mg Solu-medrol QID, 10 mEq IV KCl x4 runs yesterday.  Labs reviewed.  Drip: Precedex @ 1 mcg/kg/hr.      NUTRITION - FOCUSED PHYSICAL EXAM:    Most Recent Value  Orbital Region  No depletion  Upper Arm Region  No depletion  Thoracic and Lumbar Region  No depletion  Buccal Region  Mild depletion  Temple Region  No depletion  Clavicle Bone Region  Moderate depletion  Clavicle and Acromion Bone Region  Moderate depletion  Scapular Bone Region  Mild  depletion  Dorsal Hand  No depletion  Patellar Region  No depletion  Anterior Thigh Region  No depletion  Posterior Calf Region  No depletion  Edema (RD Assessment)  None  Hair  Reviewed  Eyes  Unable to assess  Mouth  Unable to assess  Skin  Reviewed  Nails  Reviewed       Diet Order:  Diet NPO time specified  EDUCATION NEEDS:   No education needs have been identified at this time  Skin:  Skin Assessment: Skin Integrity Issues: Skin Integrity Issues:: Stage I, Stage II Stage I: L leg Stage II: sacrum  Last BM:  PTA/unknown  Height:   Ht Readings from Last 1 Encounters:  11/25/17 5\' 2"  (1.575 m)    Weight:   Wt Readings from Last 1 Encounters:  11/26/17 123 lb 0.3 oz (55.8 kg)    Ideal Body Weight:  50 kg  BMI:  Body mass index is 22.5 kg/m.  Estimated Nutritional Needs:   Kcal:  1212  Protein:  66-83 grams (1.2-1.5 grams/kg)  Fluid:  >/= 1.5 L/day      Jarome Matin, MS, RD, LDN, [facility] Inpatient Clinical Dietitian Pager # [phone] After hours/weekend pager # [phone]

## 2017-11-27 DIAGNOSIS — J989 Respiratory disorder, unspecified: Secondary | ICD-10-CM

## 2017-11-27 DIAGNOSIS — R579 Shock, unspecified: Secondary | ICD-10-CM

## 2017-11-27 DIAGNOSIS — Z9911 Dependence on respirator [ventilator] status: Secondary | ICD-10-CM

## 2017-11-27 LAB — CBC
HEMATOCRIT: 31.8 % — AB (ref 36.0–46.0)
HEMATOCRIT: 28.9 % — AB (ref 36.0–46.0)
HEMOGLOBIN: 10.5 g/dL — AB (ref 12.0–15.0)
Hemoglobin: 9.5 g/dL — ABNORMAL LOW (ref 12.0–15.0)
MCH: 34.7 pg — ABNORMAL HIGH (ref 26.0–34.0)
MCH: 34.5 pg — ABNORMAL HIGH (ref 26.0–34.0)
MCHC: 33 g/dL (ref 30.0–36.0)
MCHC: 32.9 g/dL (ref 30.0–36.0)
MCV: 105 fL — AB (ref 78.0–100.0)
MCV: 105.1 fL — ABNORMAL HIGH (ref 78.0–100.0)
Platelets: 397 10*3/uL (ref 150–400)
Platelets: 385 10*3/uL (ref 150–400)
RBC: 3.03 MIL/uL — ABNORMAL LOW (ref 3.87–5.11)
RBC: 2.75 MIL/uL — AB (ref 3.87–5.11)
RDW: 14.1 % (ref 11.5–15.5)
RDW: 14.4 % (ref 11.5–15.5)
WBC: 15.7 10*3/uL — ABNORMAL HIGH (ref 4.0–10.5)
WBC: 13.8 10*3/uL — AB (ref 4.0–10.5)

## 2017-11-27 LAB — BASIC METABOLIC PANEL
ANION GAP: 8 (ref 5–15)
ANION GAP: 6 (ref 5–15)
BUN: 33 mg/dL — ABNORMAL HIGH (ref 6–20)
BUN: 34 mg/dL — ABNORMAL HIGH (ref 6–20)
CALCIUM: 8.5 mg/dL — AB (ref 8.9–10.3)
CHLORIDE: 96 mmol/L — AB (ref 101–111)
CO2: 33 mmol/L — ABNORMAL HIGH (ref 22–32)
CO2: 33 mmol/L — ABNORMAL HIGH (ref 22–32)
Calcium: 5.5 mg/dL — CL (ref 8.9–10.3)
Chloride: 98 mmol/L — ABNORMAL LOW (ref 101–111)
Creatinine, Ser: 0.86 mg/dL (ref 0.44–1.00)
Creatinine, Ser: 0.84 mg/dL (ref 0.44–1.00)
GFR calc Af Amer: >60 mL/min (ref >60)
GFR calc non Af Amer: >60 mL/min (ref >60)
GLUCOSE: 165 mg/dL — AB (ref 65–99)
Glucose, Bld: 180 mg/dL — ABNORMAL HIGH (ref 65–99)
Potassium: 4.5 mmol/L (ref 3.5–5.1)
Sodium: 137 mmol/L (ref 135–145)
Sodium: 137 mmol/L (ref 135–145)

## 2017-11-27 LAB — GLUCOSE, CAPILLARY
GLUCOSE-CAPILLARY: 172 mg/dL — AB (ref 65–99)
GLUCOSE-CAPILLARY: 183 mg/dL — AB (ref 65–99)
Glucose-Capillary: 134 mg/dL — ABNORMAL HIGH (ref 65–99)
Glucose-Capillary: 159 mg/dL — ABNORMAL HIGH (ref 65–99)
Glucose-Capillary: 178 mg/dL — ABNORMAL HIGH (ref 65–99)
Glucose-Capillary: 195 mg/dL — ABNORMAL HIGH (ref 65–99)
Glucose-Capillary: 241 mg/dL — ABNORMAL HIGH (ref 65–99)

## 2017-11-27 LAB — BLOOD GAS, ARTERIAL
ACID-BASE EXCESS: 9.1 mmol/L — AB (ref 0.0–2.0)
Bicarbonate: 34.4 mmol/L — ABNORMAL HIGH (ref 20.0–28.0)
DRAWN BY: 11249
FIO2: 40
LHR: 12 {breaths}/min
MECHVT: 420 mL
O2 Saturation: 98.1 %
PATIENT TEMPERATURE: 37.8
PCO2 ART: 54.6 mmHg — AB (ref 32.0–48.0)
PEEP: 5 cmH2O
PO2 ART: 107 mmHg (ref 83.0–108.0)
pH, Arterial: 7.42 (ref 7.350–7.450)

## 2017-11-27 LAB — MAGNESIUM
MAGNESIUM: 2 mg/dL (ref 1.7–2.4)
Magnesium: 1.4 mg/dL — ABNORMAL LOW (ref 1.7–2.4)

## 2017-11-27 LAB — PHOSPHORUS
Phosphorus: 2.7 mg/dL (ref 2.5–4.6)
Phosphorus: 1.8 mg/dL — ABNORMAL LOW (ref 2.5–4.6)

## 2017-11-27 MED ORDER — METHYLPREDNISOLONE SODIUM SUCC 125 MG IJ SOLR
80.0000 mg | Freq: Two times a day (BID) | INTRAMUSCULAR | Status: DC
Start: 2017-11-27 — End: 2017-11-30
  Administered 2017-11-27 – 2017-11-30 (×6): 80 mg via INTRAVENOUS
  Filled 2017-11-27 (×6): qty 2

## 2017-11-27 MED ORDER — SODIUM CHLORIDE 0.9 % IV BOLUS (SEPSIS)
1000.0000 mL | Freq: Once | INTRAVENOUS | Status: AC
  Administered 2017-11-27: 1000 mL via INTRAVENOUS

## 2017-11-27 MED ORDER — PANTOPRAZOLE SODIUM 40 MG PO PACK
40.0000 mg | PACK | Freq: Every day | ORAL | Status: DC
  Administered 2017-11-28 – 2017-11-30 (×3): 40 mg
  Filled 2017-11-27 (×3): qty 20

## 2017-11-27 MED ORDER — ORAL CARE MOUTH RINSE
15.0000 mL | Freq: Four times a day (QID) | OROMUCOSAL | Status: DC
  Administered 2017-11-27 – 2017-11-30 (×12): 15 mL via OROMUCOSAL

## 2017-11-27 MED ORDER — AZITHROMYCIN 200 MG/5ML PO SUSR
250.0000 mg | Freq: Once | ORAL | Status: AC
  Administered 2017-11-27: 250 mg
  Filled 2017-11-27: qty 10

## 2017-11-27 MED ORDER — CHLORHEXIDINE GLUCONATE 0.12% ORAL RINSE (MEDLINE KIT)
15.0000 mL | Freq: Two times a day (BID) | OROMUCOSAL | Status: DC
  Administered 2017-11-27 – 2017-11-30 (×7): 15 mL via OROMUCOSAL

## 2017-11-27 NOTE — Progress Notes (Signed)
PROGRESS NOTE  Angela Duncan  OXB:[id number] DOB: 02/04/1951 DOA: 11/24/2017 PCP: Patient, No Pcp Per  Brief Narrative: Angela Duncan is a 67 y.o. female with a history of asthma/COPD, HTN, hydrocephalus s/p VP shunt 2010, anxiety, stage 2 sacral pressure ulcer, and recent admission for hyponatremia thought to be due to HCTZ causing AMS discharged on 1/22 who presented to the ED by EMS for dyspnea associated with nonproductive cough and mild pleuritic chest pain. EMS administered oxygen, solumedrol 125mg  and nebs en route with mild improvement, though she continued to have wheezing and appeared to be in respiratory distress. ABG showed respiratory acidosis 7.299/74.3/119 on supplemental O2. Further nebulizer treatments given, pt placed on BiPAP. CXR showed emphysema without infiltrate or cardiac process. Ativan was given to accommodate BiPAP. Hospitalists consulted for admission, CCM consulted for ongoing respiratory distress. Repeat ABG requested which improved to 7.348/66.8/94.6. She continued on IV steroids, nebulized therapy, intermittent BiPAP, and azithromycin in the SDU.  On 2/1 pt was not doing any better with dec'd MS and poor resp effort on Bipap.  CCM intubated patient and is now on CCM service.   Assessment & Plan: Principal Problem:   Acute respiratory failure (HCC) Active Problems:   Hyperlipidemia LDL goal <100   Essential hypertension   COPD with asthma (HCC)   Altered mental status   Pressure injury of skin   COPD exacerbation (HCC)   Encounter for central line placement   Shock circulatory ([place])  Acute hypoxic and hypercarbic respiratory failure/ acute on chronic COPD exacerbation: +RSV by PCR.  Severe underlying COPD, did not improve with IV steroids, nebs, bipap, IV azithro.  Intubated yesterday 2/1.  On CCM service now, will sign off. Please call as needed.    Hypotension:  - Limit sedating medications - holding home antihypertensives.  Chest pain: Likely due to  COPD. ECG without ST segment deviation, though precordial leads with flattened T waves. Reviewed ECG w/Dr. Domenic Polite who feels this is a very nonspecific finding. Cardiac enzymes negative.    Edema and elevated BNP: Though no crackles on exam, cardiomegaly or pulmonary edema on CXR. ECHO showed EF 50-55% and G1DD.  No WMA  Acute encephalopathy: Suspect due to respiratory distress. Previous admission had AMS due to hyponatremia, though this has resolved.    Stage 2 sacral pressure ulcer: Does not appear infected - WOC consulted - Offload as able  Hypokalemia:  - replacing, repeat pending this am  Macrocytic anemia: B12 wnl, Iron panel largely wnl. - Monitor CBC in PM with hypotension; had drop in hgb after volume resuscitation without active bleeding. Will hold pharmacologic DVT ppx.   History of hyponatremia: Resolved, thiazide was added to allergies list as this was presumed culprit  History of hydrocephalus s/p VP shunt: No neurological deficits.   DVT prophylaxis: SCDs Code Status: Full Family Communication: no family here, CCM to d/w family as to poss intubation, etc Disposition Plan: Continue SDU Consultants: PCCM  Procedures: BiPAP Antimicrobials: Azithromycin 1/30 - 2/3   Kelly Splinter MD [facility] pgr [phone] 11/27/2017, 8:09 AM  Subjective: Intubated yest by CCM.    Objective: Vitals:   11/27/17 0500 11/27/17 0600 11/27/17 0700 11/27/17 0732  BP: (!) 106/57 (!) 124/47 (!) 137/52   Pulse: (!) 108 (!) 104 (!) 101 93  Resp: 12 13 18 15   Temp: 100.2 F (37.9 C) 100 F (37.8 C) 99.1 F (37.3 C)   TempSrc:      SpO2: 100% 100% 100% 100%  Weight: 58.4 kg (128 lb 12 oz)     Height:        Intake/Output Summary (Last 24 hours) at 11/27/2017 0805 Last data filed at 11/27/2017 0650 Gross per 24 hour  Intake 1109.34 ml  Output 400 ml  Net 709.34 ml   Filed Weights   11/25/17 1439 11/26/17 0500 11/27/17 0500  Weight: 55.4 kg (122 lb 2.2  oz) 55.8 kg (123 lb 0.3 oz) 58.4 kg (128 lb 12 oz)    Gen: on vent Pulm: no wheezing CV: Regular rate and rhythm. No murmur, rub, or gallop. No JVD, 1+ pedal edema. GI: Abdomen soft, non-tender, non-distended, with normoactive bowel sounds. No organomegaly or masses felt. Ext: Warm, no deformities Skin: Shallow pressure ulcer with serous drainage on sacrum Neuro: on vent , sedated Psych: UTD.  Data Reviewed: I have personally reviewed following labs and imaging studies  CBC: Recent Labs  Lab 11/24/17 1609 11/25/17 0442 11/25/17 1241 11/26/17 0301 11/27/17 0435  WBC 7.4 3.2* 6.8 6.9 15.7*  NEUTROABS 6.4  --   --   --   --   HGB 11.4* 9.4* 8.8* 10.7* 10.5*  HCT 34.2* 28.6* 26.4* 33.2* 31.8*  MCV 104.6* 104.8* 105.2* 107.4* 105.0*  PLT 417* 308 292 278 185   Basic Metabolic Panel: Recent Labs  Lab 11/24/17 1609 11/25/17 0442 11/26/17 0301 11/26/17 1120 11/26/17 2058 11/27/17 0435 11/27/17 0626  NA 140 137 143  --   --  137 137  K 3.0* 3.0* 4.6  --   --  >7.5* 4.5  CL 97* 97* 102  --   --  96* 98*  CO2 33* 34* 24  --   --  33* 33*  GLUCOSE 179* 168* 121*  --   --  165* 180*  BUN 13 15 18   --   --  33* 34*  CREATININE 0.58 0.51 0.46  --   --  0.86 0.84  CALCIUM 9.0 8.5* 8.8*  --   --  5.5* 8.5*  MG  --   --  2.1 2.3 2.2 1.4*  --   PHOS  --   --  2.6 2.4* 1.6* 2.7  --    GFR: Estimated Creatinine Clearance: 52.1 mL/min (by C-G formula based on SCr of 0.84 mg/dL). Liver Function Tests: Recent Labs  Lab 11/24/17 1609  AST 38  ALT 21  ALKPHOS 74  BILITOT 0.7  PROT 6.5  ALBUMIN 3.3*   No results for input(s): LIPASE, AMYLASE in the last 168 hours. No results for input(s): AMMONIA in the last 168 hours. Coagulation Profile: Recent Labs  Lab 11/24/17 1609  INR 1.00   Cardiac Enzymes: Recent Labs  Lab 11/24/17 2307 11/25/17 0442 11/25/17 1107  TROPONINI <0.03 <0.03 <0.03   BNP (last 3 results) No results for input(s): PROBNP in the last 8760  hours. HbA1C: No results for input(s): HGBA1C in the last 72 hours. CBG: Recent Labs  Lab 11/26/17 1939 11/27/17 0006 11/27/17 0331 11/27/17 0732  GLUCAP 185* 241* 159* 172*   Lipid Profile: Recent Labs    11/26/17 1318  TRIG 127   Thyroid Function Tests: No results for input(s): TSH, T4TOTAL, FREET4, T3FREE, THYROIDAB in the last 72 hours. Anemia Panel: Recent Labs    11/25/17 0442  VITAMINB12 572  FOLATE 27.0  FERRITIN 191  TIBC 178*  IRON 29  RETICCTPCT 3.2*   Urine analysis:    Component Value Date/Time   COLORURINE YELLOW 11/10/2017 [place] 11/10/2017  0438   LABSPEC 1.011 11/10/2017 0438   PHURINE 6.0 11/10/2017 0438   GLUCOSEU 50 (A) 11/10/2017 0438   HGBUR SMALL (A) 11/10/2017 0438   BILIRUBINUR NEGATIVE 11/10/2017 0438   BILIRUBINUR neg 07/28/2013 0939   KETONESUR 5 (A) 11/10/2017 0438   PROTEINUR NEGATIVE 11/10/2017 0438   UROBILINOGEN 0.2 07/28/2013 0939   UROBILINOGEN 0.2 02/20/2011 1118   NITRITE NEGATIVE 11/10/2017 0438   LEUKOCYTESUR NEGATIVE 11/10/2017 0438   Recent Results (from the past 240 hour(s))  Culture, blood (routine x 2)     Status: None (Preliminary result)   Collection Time: 11/24/17  5:17 PM  Result Value Ref Range Status   Specimen Description   Final    BLOOD RIGHT ANTECUBITAL Performed at [facility], [place] [address], [place], [place] [address]    Special Requests   Final    BOTTLES DRAWN AEROBIC ONLY Blood Culture adequate volume Performed at [facility] [address] [place], [place] [address]    Culture   Final    NO GROWTH 2 DAYS Performed at [facility] [address] [place], [place] [address]    Report Status PENDING  Incomplete  Culture, blood (routine x 2)     Status: None (Preliminary result)   Collection Time: 11/24/17  5:43 PM  Result Value Ref Range Status   Specimen Description   Final    BLOOD LEFT ANTECUBITAL Performed at [facility] [address], [place], [place] [address]    Special Requests   Final    BOTTLES DRAWN AEROBIC AND ANAEROBIC Blood Culture adequate volume Performed at [facility] [address], [place], [place] [address]    Culture   Final    NO GROWTH 2 DAYS Performed at [facility] [address], [place], [place] [address]    Report Status PENDING  Incomplete  Respiratory Panel by PCR     Status: Abnormal   Collection Time: 11/24/17 10:43 PM  Result Value Ref Range Status   Adenovirus NOT DETECTED NOT DETECTED Final   Coronavirus 229E NOT DETECTED NOT DETECTED Final   Coronavirus HKU1 NOT DETECTED NOT DETECTED Final   Coronavirus NL63 NOT DETECTED NOT DETECTED Final   Coronavirus OC43 NOT DETECTED NOT DETECTED Final   Metapneumovirus NOT DETECTED NOT DETECTED Final   Rhinovirus / Enterovirus NOT DETECTED NOT DETECTED Final   Influenza A NOT DETECTED NOT DETECTED Final   Influenza B NOT DETECTED NOT DETECTED Final   Parainfluenza Virus 1 NOT DETECTED NOT DETECTED Final   Parainfluenza Virus 2 NOT DETECTED NOT DETECTED Final   Parainfluenza Virus 3 NOT DETECTED NOT DETECTED Final   Parainfluenza Virus 4 NOT DETECTED NOT DETECTED Final   Respiratory Syncytial Virus DETECTED (A) NOT DETECTED Final    Comment: CRITICAL RESULT CALLED TO, READ BACK BY AND VERIFIED WITH: Sharyn Creamer RN 13:25 11/25/17 ([email or web address])    Bordetella pertussis NOT DETECTED NOT DETECTED Final   Chlamydophila pneumoniae NOT DETECTED NOT DETECTED Final   Mycoplasma pneumoniae NOT DETECTED NOT DETECTED Final    Comment: Performed at [facility], [address] [place],  [address]  MRSA PCR Screening     Status: None   Collection Time: 11/24/17 10:43 PM  Result Value Ref Range Status   MRSA by PCR NEGATIVE NEGATIVE Final    Comment:        The GeneXpert MRSA Assay (FDA approved for NASAL specimens only),  is one component of a comprehensive MRSA  colonization surveillance program. It is not intended to diagnose MRSA infection nor to guide or monitor treatment for MRSA infections.       Radiology Studies: Dg Abd 1 View  Result Date: 11/26/2017 CLINICAL DATA:  Acute respiratory failure. STAT READ. eval for NG tube placement. Hx of asthma. EXAM: ABDOMEN - 1 VIEW COMPARISON:  11/20/2013 FINDINGS: Nasal/orogastric tube passes well below the diaphragm. Tip projects in the mid stomach. Mild increase in bowel gas and colonic stool. No evidence of bowel obstruction. Ventriculoperitoneal catheter extends from the chest to have its tip projects over the left iliac crest. IMPRESSION: 1. Nasal/orogastric tube is well positioned. Electronically Signed   By: Lajean Manes M.D.   On: 11/26/2017 11:00   Dg Chest Port 1 View  Result Date: 11/26/2017 CLINICAL DATA:  Central line placement EXAM: PORTABLE CHEST 1 VIEW COMPARISON:  Earlier today FINDINGS: New right IJ central line with tip at the SVC. No pneumothorax or new mediastinal widening. There is a VP shunt over the left chest. Endotracheal tube with tip approximately 2 cm above the carina. An orogastric tube reaches the stomach at least. COPD with hyperinflation. Normal heart size and mediastinal contours. There is no edema, consolidation, effusion, or pneumothorax. IMPRESSION: 1. New central line without complicating feature. Remaining hardware in stable position. 2. COPD. Electronically Signed   By: Monte Fantasia M.D.   On: 11/26/2017 13:01   Portable Chest X-ray  Result Date: 11/26/2017 CLINICAL DATA:  Acute respiratory failure. STAT READ. eval for ETT tube placement. Hx of asthma. EXAM: PORTABLE CHEST 1 VIEW COMPARISON:  11/24/2017 FINDINGS: New endotracheal tube tip projects 3.4 cm above the Carina. Oral/nasogastric tube passes below the diaphragm well into the stomach. Lungs remain hyperexpanded consistent with COPD. No evidence of pneumonia or pulmonary edema. No pleural effusion or  pneumothorax. IMPRESSION: 1. Endotracheal tube and nasal/orogastric tube are well positioned. 2. Advanced COPD.  No acute cardiopulmonary disease. Electronically Signed   By: Lajean Manes M.D.   On: 11/26/2017 10:59    Scheduled Meds: . budesonide (PULMICORT) nebulizer solution  0.5 mg Nebulization BID  . enoxaparin (LOVENOX) injection  40 mg Subcutaneous QHS  . feeding supplement (VITAL AF 1.2 CAL)  1,000 mL Per Tube Q24H  . free water  50 mL Per Tube Q4H  . insulin aspart  2-6 Units Subcutaneous Q4H  . ipratropium-albuterol  3 mL Nebulization Q4H  . methylPREDNISolone (SOLU-MEDROL) injection  80 mg Intravenous Q6H  . multivitamin  15 mL Per Tube Daily  . pantoprazole (PROTONIX) IV  40 mg Intravenous Daily   Continuous Infusions: . azithromycin Stopped (11/26/17 2216)  . norepinephrine (LEVOPHED) Adult infusion 12.053 mcg/min (11/27/17 0500)  . propofol (DIPRIVAN) infusion 25 mcg/kg/min (11/27/17 0745)     LOS: 3 days    If 7PM-7AM, please contact night-coverage [email or web address] Password [email or web address] 11/27/2017, 8:05 AM

## 2017-11-27 NOTE — Progress Notes (Signed)
The 4:35am labs may not be accurate.BMP was redrawn due to being hemolyzed. The current results will be resulted soon.

## 2017-11-27 NOTE — Progress Notes (Signed)
Notified by Leonie Man RN in regards to being contacted by woman claiming to be the patient's sister-in-law and requesting information over the phone. Leonie Man RN told the woman that her name of contact was not documented in the chart and that she would have to come to the hospital to verify.Woman stated that she was sick and would not be able to. There was only person of contact in the chart and told that it was a sister.  Then Notified by Leonie Man RN in regards to a man requesting information over the phone from [place] stating "that he is her son". Patient is unable to give out information due to patient being ventilated and sedated. Leonie Man RN stated that a family member would need to come up and give [place] names and numbers of family for contact purposes, that we could not verify people we were speaking with over the phone. The man named Annie Main stated that "I will try to get her brother to come up." Annie Main called back a while later and asked Leonie Man RN if she could check and make sure their information was loaded into the chart. Leonie Man stated that she had not loaded the names and numbers because no one had been up yet. Annie Main stated they spoke with someone else and they said that they'd add their information to the contact list. RN checked and and family members names had been added with numbers. RN updated Annie Main over the phone. Contacted Annie Main in regards to Education officer, museum placing the contacts within the chart. Elissa Lovett that I would have to verify with the social worker before giving out information. Son was irritated with the process. Elissa Lovett that "we were protecting the patient's privacy rights". Annie Main said that a "lawsuit would be filed if anything happened to her and if he was not contacted." Told him that someone in upper management would call him back. Notified [facility] RN with possible HIPPA breach. [facility] [place] had Social worker, Roderic Palau Riffy, clarify  why contacts were added. Social worker has met the family when the patient was admitted in the ED. Judson Roch spoke with Annie Main again, assured him that names and numbers in the computer were appropriate and that we would be able to give information to family members that had a password. Annie Main created the password: Richardson Landry or Otila Kluver.

## 2017-11-27 NOTE — Progress Notes (Signed)
.. ..  Name: Angela Duncan MRN: [id number] DOB: 12-20-1950    ADMISSION DATE:  11/24/2017 CONSULTATION DATE: 11/24/2017  REFERRING MD : [facility] GRUNZ MD  CHIEF COMPLAINT: Shortness of breath  BRIEF PATIENT DESCRIPTION: 21 yowf reportedly never smoker  with a past medical history of asthma, COPD on BREO , hypertension on ACEi, status post VP shunt in 2010 for hydrocephalus, anxiety, stage II sacral pressure ulcer presents with dyspnea found to be in acute exacerbation of COPD guarded on noninvasive positive pressure ventilation.  PCCM consulted Due to concern of mental status/somnolence while on BiPAP.   SIGNIFICANT EVENTS  Dyspnea requiring BiPAP on admit Intubated 2/1  STUDIES:        SUBJECTIVE:  Appears comfortable on diprovan drip on vent/ moderate air trapping noted     VITAL SIGNS: Temp:  [96.1 F (35.6 C)-100.2 F (37.9 C)] 99.5 F (37.5 C) (02/02 0900) Pulse Rate:  [74-108] 103 (02/02 0900) Resp:  [10-20] 16 (02/02 0900) BP: (57-159)/(43-112) 117/44 (02/02 0900) SpO2:  [94 %-100 %] 100 % (02/02 0900) FiO2 (%):  [30 %-100 %] 30 % (02/02 0732) Weight:  [128 lb 12 oz (58.4 kg)] 128 lb 12 oz (58.4 kg) (02/02 0500)     PHYSICAL EXAMINATION:  chronically ill appearing elderly wf >> stated age  Wt Readings from Last 3 Encounters:  11/27/17 128 lb 12 oz (58.4 kg)  11/15/17 149 lb 0.5 oz (67.6 kg)  01/14/15 138 lb (62.6 kg)      Intake/Output Summary (Last 24 hours) at 11/27/2017 0954 Last data filed at 11/27/2017 0650 Gross per 24 hour  Intake 1109.34 ml  Output 400 ml  Net 709.34 ml     CVP:  [5 mmHg-16 mmHg] 5 mmHg     No jvd Orophaynx clear/ oral et in place  Neck supple Lungs with very distant bs bilaterally s audible wheeze/ air trapping on vent graphics  RRR no s3 or or sign murmur Abd soft Ext  wam with no edema or clubbing noted Neuro  Sedated but moving all 4 ext spont      Recent Labs  Lab 11/26/17 0301 11/27/17 0435  11/27/17 0626  NA 143 137 137  K 4.6 >7.5* 4.5  CL 102 96* 98*  CO2 24 33* 33*  BUN 18 33* 34*  CREATININE 0.46 0.86 0.84  GLUCOSE 121* 165* 180*   Recent Labs  Lab 11/25/17 1241 11/26/17 0301 11/27/17 0435  HGB 8.8* 10.7* 10.5*  HCT 26.4* 33.2* 31.8*  WBC 6.8 6.9 15.7*  PLT 292 278 397   Dg Abd 1 View  Result Date: 11/26/2017 CLINICAL DATA:  Acute respiratory failure. STAT READ. eval for NG tube placement. Hx of asthma. EXAM: ABDOMEN - 1 VIEW COMPARISON:  11/20/2013 FINDINGS: Nasal/orogastric tube passes well below the diaphragm. Tip projects in the mid stomach. Mild increase in bowel gas and colonic stool. No evidence of bowel obstruction. Ventriculoperitoneal catheter extends from the chest to have its tip projects over the left iliac crest. IMPRESSION: 1. Nasal/orogastric tube is well positioned. Electronically Signed   By: Lajean Manes M.D.   On: 11/26/2017 11:00   Dg Chest Port 1 View  Result Date: 11/26/2017 CLINICAL DATA:  Central line placement EXAM: PORTABLE CHEST 1 VIEW COMPARISON:  Earlier today FINDINGS: New right IJ central line with tip at the SVC. No pneumothorax or new mediastinal widening. There is a VP shunt over the left chest. Endotracheal tube with tip approximately 2 cm above the carina.  An orogastric tube reaches the stomach at least. COPD with hyperinflation. Normal heart size and mediastinal contours. There is no edema, consolidation, effusion, or pneumothorax. IMPRESSION: 1. New central line without complicating feature. Remaining hardware in stable position. 2. COPD. Electronically Signed   By: Monte Fantasia M.D.   On: 11/26/2017 13:01   Portable Chest X-ray  Result Date: 11/26/2017 CLINICAL DATA:  Acute respiratory failure. STAT READ. eval for ETT tube placement. Hx of asthma. EXAM: PORTABLE CHEST 1 VIEW COMPARISON:  11/24/2017 FINDINGS: New endotracheal tube tip projects 3.4 cm above the Carina. Oral/nasogastric tube passes below the diaphragm well into the  stomach. Lungs remain hyperexpanded consistent with COPD. No evidence of pneumonia or pulmonary edema. No pleural effusion or pneumothorax. IMPRESSION: 1. Endotracheal tube and nasal/orogastric tube are well positioned. 2. Advanced COPD.  No acute cardiopulmonary disease. Electronically Signed   By: Lajean Manes M.D.   On: 11/26/2017 10:59      ASSESSMENT / PLAN:   Acute on chronic hypercarbic & hypoxic respiratory failure in setting of AEAB (never smoker so doubt typical copd here)  - vent dep now and still some air trapping so keep sedated - ? Upper airway component to wheezing chronically related to ACEi or DPI or both > avoid in future        Acute encephalopathy; presumed mix of History of hypercarbia/hypoxia and metabolic in origin. Superimpose on h/o hydrocephalus status post VP shunt Plan contine proprafol for now    Presumed sepsis;  - BC x  1/30 >>> - Viral panel 1/30 >>> pos RSV - Zmax 1/30 >>  Plan Continue zmax but be on lookout for Hosp Acq infection Vol expand and monitor CVP for vol and wean off levophed with cvp relatively low given autopeep issues from airtrapping     Fluid and Electrolyte imbalance: Hypokalemia  Plan Resolved   Anemia (macrocytic)   Lab Results  Component Value Date   HGB 10.5 (L) 11/27/2017   HGB 10.7 (L) 11/26/2017   HGB 8.8 (L) 11/25/2017    Plan Trend cbc  Stage II sacral ulcer  Plan Per WO team   Hyperglycemia Plan ssi    GI prophylaxis:  Changed to ppi per ft 2/2/ DVT prophylaxis :  Continue lovenox     The patient is critically ill with multiple organ systems failure and requires high complexity decision making for assessment and support, frequent evaluation and titration of therapies, application of advanced monitoring technologies and extensive interpretation of multiple databases. Critical Care Time devoted to patient care services described in this note is 45 minutes.    Christinia Gully, MD Pulmonary and  [place] [phone] After 5:30 PM or weekends, use [facility] [phone]

## 2017-11-27 NOTE — Progress Notes (Addendum)
Pt's sister-in-law and brother called and stated the pt's son would like to be contacted first regarding any updates or medical decisions and gave permissoin for this to happen.  Pt's son lives in [place] and his name and number are:  Collins [place] at ph: [phone].  Pt's sister-in-law isDebbie Roland at ph: [phone] and pt's brother is Sullivan Lone at ph: [phone] or [phone].  2:21 PM CSW verified this information (above) is correct and reported to RN from ICU this writer had verified with family that pt's son Collins [place] at ph: [phone] is pt's son who lives in [place].  Please reconsult if future social work needs arise.  CSW signing off, as social work intervention is no longer needed.  Alphonse Guild. Ravis Herne, LCSW, LCAS, CSI Clinical Social Worker Ph: [phone]

## 2017-11-28 LAB — GLUCOSE, CAPILLARY
GLUCOSE-CAPILLARY: 130 mg/dL — AB (ref 65–99)
Glucose-Capillary: 133 mg/dL — ABNORMAL HIGH (ref 65–99)
Glucose-Capillary: 138 mg/dL — ABNORMAL HIGH (ref 65–99)
Glucose-Capillary: 154 mg/dL — ABNORMAL HIGH (ref 65–99)
Glucose-Capillary: 162 mg/dL — ABNORMAL HIGH (ref 65–99)
Glucose-Capillary: 174 mg/dL — ABNORMAL HIGH (ref 65–99)

## 2017-11-28 LAB — PHOSPHORUS: Phosphorus: 2.3 mg/dL — ABNORMAL LOW (ref 2.5–4.6)

## 2017-11-28 LAB — MAGNESIUM: Magnesium: 2.3 mg/dL (ref 1.7–2.4)

## 2017-11-28 MED ORDER — SODIUM CHLORIDE 0.9 % IV BOLUS (SEPSIS)
500.0000 mL | Freq: Once | INTRAVENOUS | Status: AC
  Administered 2017-11-28: 500 mL via INTRAVENOUS

## 2017-11-28 MED ORDER — DEXMEDETOMIDINE HCL IN NACL 200 MCG/50ML IV SOLN
0.4000 ug/kg/h | INTRAVENOUS | Status: DC
  Administered 2017-11-28: 0.7 ug/kg/h via INTRAVENOUS
  Administered 2017-11-28: 0.4 ug/kg/h via INTRAVENOUS
  Administered 2017-11-28: 0.5 ug/kg/h via INTRAVENOUS
  Administered 2017-11-29 (×2): 1.2 ug/kg/h via INTRAVENOUS
  Administered 2017-11-29: 0.8 ug/kg/h via INTRAVENOUS
  Administered 2017-11-29 (×2): 1.2 ug/kg/h via INTRAVENOUS
  Filled 2017-11-28 (×8): qty 50

## 2017-11-28 MED ORDER — SODIUM CHLORIDE 0.9 % IV BOLUS (SEPSIS)
1000.0000 mL | Freq: Once | INTRAVENOUS | Status: AC
  Administered 2017-11-28: 1000 mL via INTRAVENOUS

## 2017-11-28 NOTE — Progress Notes (Signed)
Patients blood pressure 83/35 with a MAP of 50 s/p administration of fentanyl 50mg .  Contacted Dr. Melvyn Novas updated MD on patients change in condition, received an order for 539ml NS bolus and CVP monitoring.  Bolus currently infusing patients blood pressure increased to 97/39.  Will continue to monitor.

## 2017-11-28 NOTE — Progress Notes (Signed)
.. ..  Name: Angela Duncan MRN: [id number] DOB: 11-10-1950    ADMISSION DATE:  11/24/2017 CONSULTATION DATE: 11/24/2017  REFERRING MD : [facility] hospitalist- GRUNZ MD  CHIEF COMPLAINT: Shortness of breath  BRIEF PATIENT DESCRIPTION: 65 yowf  Former smoker (listed as never smoker in [place] but clarified by fm 11/27/17 as quit "years ago")   with a past medical history of asthma, COPD on BREO , hypertension on ACEi, status post VP shunt in 2010 for hydrocephalus, anxiety, stage II sacral pressure ulcer presents with dyspnea found to be in acute exacerbation of COPD guarded on noninvasive positive pressure ventilation.  PCCM consulted Due to concern of mental status/somnolence while on BiPAP.   SIGNIFICANT EVENTS  Dyspnea requiring BiPAP on admit Intubated 2/1>>>          SUBJECTIVE:  Comfortable on vent though tends to double breath when less sedated     VITAL SIGNS: Temp:  [98.8 F (37.1 C)-100.4 F (38 C)] 100.4 F (38 C) (02/03 0800) Pulse Rate:  [79-108] 96 (02/03 0800) Resp:  [10-22] 11 (02/03 0800) BP: (72-139)/(22-65) 117/56 (02/03 0800) SpO2:  [96 %-100 %] 100 % (02/03 0800) FiO2 (%):  [30 %] 30 % (02/03 0800) Weight:  [130 lb 15.3 oz (59.4 kg)] 130 lb 15.3 oz (59.4 kg) (02/03 0413)     PHYSICAL EXAMINATION:  chronically ill appearing elderly wf >> stated age  Wt Readings from Last 3 Encounters:  11/28/17 130 lb 15.3 oz (59.4 kg)  11/15/17 149 lb 0.5 oz (67.6 kg)  01/14/15 138 lb (62.6 kg)     Intake/Output Summary (Last 24 hours) at 11/28/2017 0840 Last data filed at 11/28/2017 0800 Gross per 24 hour  Intake 1647.15 ml  Output 670 ml  Net 977.15 ml      CVP:  [5 mmHg-13 mmHg] 7 mmHg        Pt sedated on vent/ comfortable at 30 degrees hob  No jvd Oropharynx clear/ et in place  Neck supple Lungs with a few scattered exp > insp rhonchi bilaterally/ air trapping on graphics RRR no s3 or or sign murmur Abd soft Extr wam with no edema or clubbing noted      Recent Labs  Lab 11/26/17 0301 11/27/17 0435 11/27/17 0626  NA 143 137 137  K 4.6 >7.5* 4.5  CL 102 96* 98*  CO2 24 33* 33*  BUN 18 33* 34*  CREATININE 0.46 0.86 0.84  GLUCOSE 121* 165* 180*   Recent Labs  Lab 11/26/17 0301 11/27/17 0435 11/27/17 2049  HGB 10.7* 10.5* 9.5*  HCT 33.2* 31.8* 28.9*  WBC 6.9 15.7* 13.8*  PLT 278 397 385   Dg Abd 1 View  Result Date: 11/26/2017 CLINICAL DATA:  Acute respiratory failure. STAT READ. eval for NG tube placement. Hx of asthma. EXAM: ABDOMEN - 1 VIEW COMPARISON:  11/20/2013 FINDINGS: Nasal/orogastric tube passes well below the diaphragm. Tip projects in the mid stomach. Mild increase in bowel gas and colonic stool. No evidence of bowel obstruction. Ventriculoperitoneal catheter extends from the chest to have its tip projects over the left iliac crest. IMPRESSION: 1. Nasal/orogastric tube is well positioned. Electronically Signed   By: Lajean Manes M.D.   On: 11/26/2017 11:00   Dg Chest Port 1 View  Result Date: 11/26/2017 CLINICAL DATA:  Central line placement EXAM: PORTABLE CHEST 1 VIEW COMPARISON:  Earlier today FINDINGS: New right IJ central line with tip at the SVC. No pneumothorax or new mediastinal widening. There is a VP  shunt over the left chest. Endotracheal tube with tip approximately 2 cm above the carina. An orogastric tube reaches the stomach at least. COPD with hyperinflation. Normal heart size and mediastinal contours. There is no edema, consolidation, effusion, or pneumothorax. IMPRESSION: 1. New central line without complicating feature. Remaining hardware in stable position. 2. COPD. Electronically Signed   By: Monte Fantasia M.D.   On: 11/26/2017 13:01   Portable Chest X-ray  Result Date: 11/26/2017 CLINICAL DATA:  Acute respiratory failure. STAT READ. eval for ETT tube placement. Hx of asthma. EXAM: PORTABLE CHEST 1 VIEW COMPARISON:  11/24/2017 FINDINGS: New endotracheal tube tip projects 3.4 cm above the Carina.  Oral/nasogastric tube passes below the diaphragm well into the stomach. Lungs remain hyperexpanded consistent with COPD. No evidence of pneumonia or pulmonary edema. No pleural effusion or pneumothorax. IMPRESSION: 1. Endotracheal tube and nasal/orogastric tube are well positioned. 2. Advanced COPD.  No acute cardiopulmonary disease. Electronically Signed   By: Lajean Manes M.D.   On: 11/26/2017 10:59      ASSESSMENT / PLAN:   Acute on chronic hypercarbic & hypoxic respiratory failure in setting of  Probable severe copd or chronic Asthma plus on ACEi prior to admit - main issue now is air tapping > rx pulmicort neb/ solumedrol/ duoneb       Acute encephalopathy; presumed mix of History of hypercarbia/hypoxia and metabolic in origin. Superimpose on h/o hydrocephalus status post VP shunt Plan Wean propafol if tol, consider precedex if needed for active weaning to prevent increased RR from driving worse air trapping    Presumed sepsis;  - BC x  1/30 >>> - Viral panel 1/30 >>> pos RSV - Zmax 1/30 >>  Plan bp and cvp still low am 2/3 and back on levo  so rec vol expand     Fluid and Electrolyte imbalance: Hypokalemia  Plan Resolved   Anemia (macrocytic)   Lab Results  Component Value Date   HGB 9.5 (L) 11/27/2017   HGB 10.5 (L) 11/27/2017   HGB 10.7 (L) 11/26/2017    Plan Trend cbc  Stage II sacral ulcer  Plan Per WO team   Hyperglycemia Plan ssi    GI prophylaxis:  Changed to ppi per ft 2/2/ DVT prophylaxis :  Continue lovenox   Family:  Discussed with brother, nephew at bedside and son Annie Main on [place]  The patient is critically ill with multiple organ systems failure and requires high complexity decision making for assessment and support, frequent evaluation and titration of therapies, application of advanced monitoring technologies and extensive interpretation of multiple databases. Critical Care Time devoted to patient care services described in this note  is 45 minutes.    Christinia Gully, MD Pulmonary and [place] [phone] After 5:30 PM or weekends, use Beeper [phone]

## 2017-11-28 NOTE — Progress Notes (Signed)
Patients BP 77/33 patient continuing to have signs of agitation.  Updated Dr. Melvyn Novas on patients condition RN received an order for one liter bolus of NS.  Repeat BP 109/46.  Patient appears to be resting comfortably at this time.  Will continue to monitor.

## 2017-11-28 NOTE — Progress Notes (Signed)
Patient experiencing increased agitation attempting to remove the ventilation tube.  Dr. Melvyn Novas updated on patients condition RN received an order for Precedex.  Precedex initiated at 0.4 with some relief.  Will continue to monitor.

## 2017-11-28 NOTE — Progress Notes (Signed)
Patient attempted on wean, but was apneic and backup ventilation initiated. Will attempt wean again when patient is more awake.

## 2017-11-29 ENCOUNTER — Inpatient Hospital Stay (HOSPITAL_COMMUNITY): Payer: Medicare HMO

## 2017-11-29 LAB — CULTURE, BLOOD (ROUTINE X 2)
Culture: NO GROWTH
Culture: NO GROWTH
SPECIAL REQUESTS: ADEQUATE
Special Requests: ADEQUATE

## 2017-11-29 LAB — GLUCOSE, CAPILLARY
GLUCOSE-CAPILLARY: 153 mg/dL — AB (ref 65–99)
GLUCOSE-CAPILLARY: 133 mg/dL — AB (ref 65–99)
GLUCOSE-CAPILLARY: 164 mg/dL — AB (ref 65–99)
GLUCOSE-CAPILLARY: 171 mg/dL — AB (ref 65–99)
Glucose-Capillary: 197 mg/dL — ABNORMAL HIGH (ref 65–99)
Glucose-Capillary: 163 mg/dL — ABNORMAL HIGH (ref 65–99)

## 2017-11-29 LAB — CBC
HCT: 26.4 % — ABNORMAL LOW (ref 36.0–46.0)
Hemoglobin: 8.9 g/dL — ABNORMAL LOW (ref 12.0–15.0)
MCH: 35.5 pg — AB (ref 26.0–34.0)
MCHC: 33.7 g/dL (ref 30.0–36.0)
MCV: 105.2 fL — AB (ref 78.0–100.0)
PLATELETS: 314 10*3/uL (ref 150–400)
RBC: 2.51 MIL/uL — ABNORMAL LOW (ref 3.87–5.11)
RDW: 14.5 % (ref 11.5–15.5)
WBC: 9 10*3/uL (ref 4.0–10.5)

## 2017-11-29 LAB — BASIC METABOLIC PANEL
Anion gap: 4 — ABNORMAL LOW (ref 5–15)
BUN: 29 mg/dL — AB (ref 6–20)
CO2: 31 mmol/L (ref 22–32)
CREATININE: 0.51 mg/dL (ref 0.44–1.00)
Calcium: 8.2 mg/dL — ABNORMAL LOW (ref 8.9–10.3)
Chloride: 105 mmol/L (ref 101–111)
GFR calc Af Amer: >60 mL/min (ref >60)
GLUCOSE: 173 mg/dL — AB (ref 65–99)
Potassium: 3.9 mmol/L (ref 3.5–5.1)
SODIUM: 140 mmol/L (ref 135–145)

## 2017-11-29 LAB — PHOSPHORUS: Phosphorus: 2.1 mg/dL — ABNORMAL LOW (ref 2.5–4.6)

## 2017-11-29 LAB — MAGNESIUM: MAGNESIUM: 1.9 mg/dL (ref 1.7–2.4)

## 2017-11-29 MED ORDER — SODIUM CHLORIDE 0.9 % IV SOLN
0.4000 ug/kg/h | INTRAVENOUS | Status: DC
  Administered 2017-11-29: 1.2 ug/kg/h via INTRAVENOUS
  Filled 2017-11-29: qty 4

## 2017-11-29 MED ORDER — CLONAZEPAM 0.5 MG PO TBDP
0.5000 mg | ORAL_TABLET | Freq: Two times a day (BID) | ORAL | Status: DC
  Administered 2017-11-29 – 2017-11-30 (×4): 0.5 mg
  Filled 2017-11-29 (×5): qty 1

## 2017-11-29 MED ORDER — LIP MEDEX EX OINT
TOPICAL_OINTMENT | CUTANEOUS | Status: AC
  Administered 2017-11-29: 16:00:00
  Filled 2017-11-29: qty 7

## 2017-11-29 MED ORDER — SODIUM CHLORIDE 0.9 % IV SOLN: 0.4000 ug/kg/h | INTRAVENOUS | Status: DC

## 2017-11-29 MED ORDER — DEXMEDETOMIDINE HCL IN NACL 400 MCG/100ML IV SOLN
0.4000 ug/kg/h | INTRAVENOUS | Status: DC
  Administered 2017-11-30 (×2): 1.2 ug/kg/h via INTRAVENOUS
  Administered 2017-11-30 – 2017-12-01 (×2): 0.6 ug/kg/h via INTRAVENOUS
  Filled 2017-11-29 (×4): qty 100

## 2017-11-29 MED ORDER — QUETIAPINE FUMARATE 50 MG PO TABS
25.0000 mg | ORAL_TABLET | Freq: Two times a day (BID) | ORAL | Status: DC
  Administered 2017-11-29 – 2017-11-30 (×4): 25 mg
  Filled 2017-11-29 (×4): qty 1

## 2017-11-29 MED ORDER — OSMOLITE 1.2 CAL PO LIQD
1000.0000 mL | ORAL | Status: DC
  Administered 2017-11-29: 1000 mL

## 2017-11-29 MED ORDER — CLONAZEPAM 0.1 MG/ML ORAL SUSPENSION
0.5000 mg | Freq: Two times a day (BID) | ORAL | Status: DC
  Filled 2017-11-29: qty 5

## 2017-11-29 MED ORDER — POTASSIUM PHOSPHATES 15 MMOLE/5ML IV SOLN
30.0000 mmol | Freq: Once | INTRAVENOUS | Status: AC
  Administered 2017-11-29: 30 mmol via INTRAVENOUS
  Filled 2017-11-29: qty 10

## 2017-11-29 NOTE — Progress Notes (Signed)
.. ..  Name: Angela Duncan MRN: [id number] DOB: 1950/11/19    ADMISSION DATE:  11/24/2017 CONSULTATION DATE: 11/24/2017  REFERRING MD : [facility] hospitalist- GRUNZ MD  CHIEF COMPLAINT: Shortness of breath  BRIEF PATIENT DESCRIPTION: 29 yowf  Former smoker (listed as never smoker in [place] but clarified by fm 11/27/17 as quit "years ago")   with a past medical history of asthma, COPD on BREO , hypertension on ACEi, status post VP shunt in 2010 for hydrocephalus, anxiety, stage II sacral pressure ulcer presents with dyspnea found to be in acute exacerbation of COPD guarded on noninvasive positive pressure ventilation.  PCCM consulted Due to concern of mental status/somnolence while on BiPAP.   SIGNIFICANT EVENTS  Dyspnea requiring BiPAP on admit Intubated 2/1>>>    Culture data  - BC x  1/30 >>> - Viral panel 1/30 >>> pos RSV Antibiotics - Zmax 1/30 >> completed 1.5 g   SUBJECTIVE:  Agitated indicates she is short of breath   VITAL SIGNS: Temp:  [98.9 F (37.2 C)-100.4 F (38 C)] 98.9 F (37.2 C) (02/04 0000) Pulse Rate:  [72-113] 86 (02/04 0600) Resp:  [10-30] 12 (02/04 0600) BP: (83-154)/(31-73) 98/45 (02/04 0600) SpO2:  [99 %-100 %] 100 % (02/04 0720) FiO2 (%):  [30 %] 30 % (02/04 0720) Weight:  [143 lb 8.3 oz (65.1 kg)] 143 lb 8.3 oz (65.1 kg) (02/04 0500)    Wt Readings from Last 3 Encounters:  11/29/17 143 lb 8.3 oz (65.1 kg)  11/15/17 149 lb 0.5 oz (67.6 kg)  01/14/15 138 lb (62.6 kg)     Intake/Output Summary (Last 24 hours) at 11/29/2017 0844 Last data filed at 11/29/2017 0600 Gross per 24 hour  Intake 1178.21 ml  Output 1055 ml  Net 123.21 ml      CVP:  [6 mmHg-17 mmHg] 13 mmHg   Physical exam  General: This is a frail 67 year old female she is currently agitated and restless.  She appears much older than stated age, has marked accessory use on both full support as well as pressure support ventilation HEENT: Normocephalic atraumatic.  She does have some  temporal wasting.  She is orally intubated.  And edentulous. Pulmonary: Marked accessory use, prolonged exhale phase with distant prolonged wheeze, work of breathing appears equally labored on full support versus pressure support ventilation however appears to want higher tidal volumes achieved with pressure support of 14 Cardiac: Regular rate and rhythm without murmur rub or gallop Extremities/muscular skeleton: Warm, dry, has developed some mild anasarca.  Scattered areas of ecchymosis appreciated.  Strong pulses brisk cap refill Abdomen: Soft, nontender, positive bowel sounds, tolerating tube feeds. Neuro/psych: Awake, agitated, reaching out, trying to communicate but confused indicates shortness of breath   CBC Recent Labs    11/27/17 0435 11/27/17 2049 11/29/17 0500  WBC 15.7* 13.8* 9.0  HGB 10.5* 9.5* 8.9*  HCT 31.8* 28.9* 26.4*  PLT 397 385 314    Coag's No results for input(s): APTT, INR in the last 72 hours.  BMET Recent Labs    11/27/17 0435 11/27/17 0626 11/29/17 0500  NA 137 137 140  K >7.5* 4.5 3.9  CL 96* 98* 105  CO2 33* 33* 31  BUN 33* 34* 29*  CREATININE 0.86 0.84 0.51  GLUCOSE 165* 180* 173*    Electrolytes Recent Labs    11/27/17 0435 11/27/17 0626 11/27/17 1709 11/28/17 0409 11/29/17 0500  CALCIUM 5.5* 8.5*  --   --  8.2*  MG 1.4*  --  2.0  2.3 1.9  PHOS 2.7  --  1.8* 2.3* 2.1*    Sepsis Markers No results for input(s): PROCALCITON, O2SATVEN in the last 72 hours.  Invalid input(s): LACTICACIDVEN  ABG Recent Labs    11/26/17 1130 11/27/17 0519  PHART 7.459* 7.420  PCO2ART 46.4 54.6*  PO2ART 412* 107    Liver Enzymes No results for input(s): AST, ALT, ALKPHOS, BILITOT, ALBUMIN in the last 72 hours.  Cardiac Enzymes No results for input(s): TROPONINI, PROBNP in the last 72 hours.  Glucose Recent Labs    11/28/17 1112 11/28/17 1505 11/28/17 2024 11/28/17 2351 11/29/17 0406 11/29/17 0802  GLUCAP 130* 133* 154* 138* 153*  133*    Imaging Dg Chest Port 1 View  Result Date: 11/29/2017 CLINICAL DATA:  Acute respiratory failure EXAM: PORTABLE CHEST 1 VIEW COMPARISON:  11/26/2017 FINDINGS: Cardiac shadow is within normal limits. Arctic calcifications are again seen. Right jugular central line, endotracheal tube and nasogastric catheter are noted in satisfactory position. Left-sided shunt catheter is seen. Lungs are well aerated without focal infiltrate or sizable effusion. IMPRESSION: No acute abnormality noted.  No change from the prior study. Electronically Signed   By: Inez Catalina M.D.   On: 11/29/2017 07:05      ASSESSMENT / PLAN:   Acute on chronic hypercarbic & hypoxic respiratory failure in setting of acute RSV infection resulting in acute exacerbation of chronic obstructive pulmonary disease  -air trapping exacerbated by anxiety +/- what is likely baseline cognitive limitations. Volumes acceptable on pressure support ventilation -Portable chest x-ray this is personally reviewed: Lines and tubes are in satisfactory position.  No infiltrates, hyperinflated. Plan Continue pressure support ventilation as tolerated Titrating sedation, goal RASS is -1; she is on higher and dosing of Precedex, will add Seroquel Continue DuoNeb every 4 hours Continue current dosing of steroids Continue Pulmicort Discontinue future ACE inhibitors, given what appears to be severe airflow limitations     Acute encephalopathy; presumed mix of History of hypercarbia/hypoxia and metabolic in origin. Superimpose on h/o hydrocephalus status post VP shunt Severe anxiety Plan Adding Seroquel as noted above Will also add low-dose clonazepam   Hypotension, mix of medication induced and hypovolemia Plan Continue euvolemic goal  Continue telemetry    Fluid and Electrolyte imbalance: Hypokalemia  Plan Intermittent lab work  Anemia (macrocytic) Plan Trend CBC Continue low molecular weight heparin  Stage II sacral ulcer    Plan Per wound ostomy team  Hyperglycemia Plan Sliding scale insulin   GI prophylaxis:  Changed to ppi per ft 2/2 DVT prophylaxis : Pressure support ventilation Lovenox  Nutrition: Tube feeds Disposition: Keep in the intensive care CODE STATUS: Limited code  Family:  Discussed with brother, nephew at bedside and son Annie Main on [place]  Summary: Anxiety/encephalopathy superimposed on what appears to be severe airflow limitations resulting in air trapping and poor patient ventilator interaction.  I think she will be difficult to wean off the ventilator which I shared this concern with the family the day she was intubated.  The largest goal at this point needs to be maximizing her anxiety lytic/delirium regimen in hopes that we can successfully extubate her.  In my opinion we should aim for a one-way extubation.   My critical care times 35 minutes Erick Colace ACNP-BC [place] Pager # [phone] OR # [phone] if no answer

## 2017-11-29 NOTE — Progress Notes (Signed)
Son, Angela Duncan, requesting to add more people to the emergency contact list, however, he remains primary.

## 2017-11-29 NOTE — Progress Notes (Signed)
Date: November 29, 2017 Velva Harman, BSN, [place], [place] [phone] Chart and notes review for patient progress and needs./Remains on full ventilator support. Will follow for case management and discharge needs. No cm or discharge needs present at time of this review. Next review date: [id number]

## 2017-11-29 NOTE — Progress Notes (Signed)
Nutrition Follow-up  DOCUMENTATION CODES:   Not applicable  INTERVENTION:  - Will adjust TF regimen: Osmolite 1.2 @ 40 mL/hr to advance by 10 mL in 12 hours to reach goal rate of Osmolite 1.2 @ 50 mL/hr. At goal rate, this regimen will provide 1440 kcal, 67 grams of protein, and 984 mL free water. - Will d/c free water flush given increase in free water in new TF formula and weight trend since admission.   Monitor magnesium, potassium, and phosphorus daily for at least 3 days, MD to replete as needed, as pt is at risk for refeeding syndrome given hypophosphatemia since TF initiation on 2/1.   NUTRITION DIAGNOSIS:   Inadequate oral intake related to inability to eat as evidenced by NPO status. -ongoing  GOAL:   Patient will meet greater than or equal to 90% of their needs -met with TF regimen  MONITOR:   Vent status, TF tolerance, Weight trends, Skin  ASSESSMENT:   67 y.o. female with a history of asthma/COPD, HTN, hydrocephalus s/p VP shunt 2010, anxiety, stage 2 sacral pressure ulcer, and recent admission for hyponatremia thought to be due to HCTZ causing AMS discharged on 1/22 who presented to the ED by EMS for dyspnea associated with nonproductive cough and mild pleuritic chest pain.   Weight +21 lbs/9.7 kg since admission so continue to use admission weight (55.4 kg) in estimating needs. Pt remains intubated and OGT in place. She is receiving Vital AF 1.2 @ 40 mL/hr with 50 mL free water every 4 hours. This regimen is providing 1152 kcal, 72 grams of protein, and 1078 mL free water. Propofol off since previous assessment.   Reviewed Pete's, PCCM NP, note from this AM which states that agitation/anxiety continues and may be a barrier to extubation. Will continue to monitor for POC. Pt's son was at bedside at time of RD visit and denies anyone explaining TF to him; RD explained this and he denies any questions or concerns about nutrition at this time.  Patient is currently  intubated on ventilator support MV: 8.4 L/min Temp (24hrs), Avg:99.8 F (37.7 C), Min:98.9 F (37.2 C), Max:100.2 F (37.9 C) Propofol: none BP: 119/58 and MAP: 76  Medications reviewed; Labs reviewed; CBGs: 153 and 133 mg/dL this AM, BUN: 29 mg/dL, Phos: 2.1 mg/dL.  Drip: Precedex @ 1.2 mcg/kg/hr.    Diet Order:  Diet NPO time specified  EDUCATION NEEDS:   No education needs have been identified at this time  Skin:  Skin Assessment: Skin Integrity Issues: Skin Integrity Issues:: Stage I, Stage II Stage I: L leg Stage II: sacrum  Last BM:  PTA/unknown  Height:   Ht Readings from Last 1 Encounters:  11/25/17 _0  (1.575 m)    Weight:   Wt Readings from Last 1 Encounters:  11/29/17 143 lb 8.3 oz (65.1 kg)    Ideal Body Weight:  50 kg  BMI:  Body mass index is 26.25 kg/m.  Estimated Nutritional Needs:   Kcal:  1388  Protein:  66-83 grams (1.2-1.5 grams/kg)  Fluid:  >/= 1.5 L/day      Jarome Matin, MS, RD, LDN, [facility] Clinical Dietitian Pager # [phone] After hours/weekend pager # [phone]

## 2017-11-30 LAB — GLUCOSE, CAPILLARY
GLUCOSE-CAPILLARY: 197 mg/dL — AB (ref 65–99)
GLUCOSE-CAPILLARY: 108 mg/dL — AB (ref 65–99)
GLUCOSE-CAPILLARY: 127 mg/dL — AB (ref 65–99)
GLUCOSE-CAPILLARY: 114 mg/dL — AB (ref 65–99)
Glucose-Capillary: 178 mg/dL — ABNORMAL HIGH (ref 65–99)
Glucose-Capillary: 94 mg/dL (ref 65–99)

## 2017-11-30 MED ORDER — METHYLPREDNISOLONE SODIUM SUCC 40 MG IJ SOLR
40.0000 mg | Freq: Two times a day (BID) | INTRAMUSCULAR | Status: DC
Start: 2017-11-30 — End: 2017-12-04
  Administered 2017-11-30 – 2017-12-04 (×8): 40 mg via INTRAVENOUS
  Filled 2017-11-30 (×9): qty 1

## 2017-11-30 NOTE — Progress Notes (Signed)
.. ..  Name: Angela Duncan MRN: [id number] DOB: 08-04-1951    ADMISSION DATE:  11/24/2017 CONSULTATION DATE: 11/24/2017  REFERRING MD : [facility] hospitalist- GRUNZ MD  CHIEF COMPLAINT: Shortness of breath  BRIEF PATIENT DESCRIPTION: 63 yowf  Former smoker (listed as never smoker in [place] but clarified by fm 11/27/17 as quit "years ago")   with a past medical history of asthma, COPD on BREO , hypertension on ACEi, status post VP shunt in 2010 for hydrocephalus, anxiety, stage II sacral pressure ulcer presents with dyspnea found to be in acute exacerbation of COPD guarded on noninvasive positive pressure ventilation.  PCCM consulted Due to concern of mental status/somnolence while on BiPAP.   SIGNIFICANT EVENTS  Dyspnea requiring BiPAP on admit Intubated 2/1>>>    Culture data  - BC x  1/30 >>> - Viral panel 1/30 >>> pos RSV Antibiotics - Zmax 1/30 >> completed 1.5 g   SUBJECTIVE:  Looks ready to extubate   VITAL SIGNS: Temp:  [98.1 F (36.7 C)-100.8 F (38.2 C)] 100.8 F (38.2 C) (02/05 0800) Pulse Rate:  [71-87] 77 (02/05 0800) Resp:  [11-20] 15 (02/05 0800) BP: (96-130)/(38-71) 101/58 (02/05 0800) SpO2:  [98 %-100 %] 98 % (02/05 0902) FiO2 (%):  [30 %] 30 % (02/05 0808) Weight:  [138 lb 10.7 oz (62.9 kg)] 138 lb 10.7 oz (62.9 kg) (02/05 0458)    Wt Readings from Last 3 Encounters:  11/30/17 138 lb 10.7 oz (62.9 kg)  11/15/17 149 lb 0.5 oz (67.6 kg)  01/14/15 138 lb (62.6 kg)     Intake/Output Summary (Last 24 hours) at 11/30/2017 1007 Last data filed at 11/30/2017 0700 Gross per 24 hour  Intake 1337.8 ml  Output 750 ml  Net 587.8 ml      CVP:  [2 mmHg-12 mmHg] 2 mmHg   Physical exam  General: This is a frail 67 year old female who appears much older than stated age. HEENT: Normocephalic atraumatic and edentulous orally intubated  pulmonary: Auditory wheeze, improved air movement, accessory muscle use improved Cardiac: Regular rate and rhythm Abdomen: Soft, not  tender tol Tubefeeds Ext: some evolving dependent edema. Scattered areas of ecchymosis  Neuro: awake, f/c more calm   CBC Recent Labs    11/27/17 2049 11/29/17 0500  WBC 13.8* 9.0  HGB 9.5* 8.9*  HCT 28.9* 26.4*  PLT 385 314    Coag's No results for input(s): APTT, INR in the last 72 hours.  BMET Recent Labs    11/29/17 0500  NA 140  K 3.9  CL 105  CO2 31  BUN 29*  CREATININE 0.51  GLUCOSE 173*    Electrolytes Recent Labs    11/27/17 1709 11/28/17 0409 11/29/17 0500  CALCIUM  --   --  8.2*  MG 2.0 2.3 1.9  PHOS 1.8* 2.3* 2.1*    Sepsis Markers No results for input(s): PROCALCITON, O2SATVEN in the last 72 hours.  Invalid input(s): LACTICACIDVEN  ABG No results for input(s): PHART, PCO2ART, PO2ART in the last 72 hours.  Liver Enzymes No results for input(s): AST, ALT, ALKPHOS, BILITOT, ALBUMIN in the last 72 hours.  Cardiac Enzymes No results for input(s): TROPONINI, PROBNP in the last 72 hours.  Glucose Recent Labs    11/29/17 0802 11/29/17 1110 11/29/17 1528 11/29/17 1909 11/29/17 2330 11/30/17 0317  GLUCAP 133* 164* 197* 171* 163* 197*    Imaging Dg Chest Port 1 View  Result Date: 11/29/2017 CLINICAL DATA:  Acute respiratory failure EXAM: PORTABLE CHEST 1 VIEW  COMPARISON:  11/26/2017 FINDINGS: Cardiac shadow is within normal limits. Arctic calcifications are again seen. Right jugular central line, endotracheal tube and nasogastric catheter are noted in satisfactory position. Left-sided shunt catheter is seen. Lungs are well aerated without focal infiltrate or sizable effusion. IMPRESSION: No acute abnormality noted.  No change from the prior study. Electronically Signed   By: Inez Catalina M.D.   On: 11/29/2017 07:05      ASSESSMENT / PLAN:   Acute on chronic hypercarbic & hypoxic respiratory failure in setting of acute RSV infection resulting in acute exacerbation of chronic obstructive pulmonary disease  -air trapping exacerbated by  anxiety +/- what is likely baseline cognitive limitations. Volumes acceptable on pressure support ventilation -Ventilator mechanics look much more favorable, anxiety seems much improved as well Plan Continue pressure support ventilation with plan to extubate later today when family arrives Do not reintubate following extubation Continue scheduled duo nebs Continue systemic steroids, will reduce dose today Continue Pulmicort No further ACE inhibitors given concern for complicating upper airway mechanics Weaning oxygen BiPAP as needed following extubation    Acute encephalopathy; presumed mix of History of hypercarbia/hypoxia and metabolic in origin. Superimpose on h/o hydrocephalus status post VP shunt Severe anxiety Plan Continue Seroquel and clonazepam We will likely extubate on Precedex with plan to taper off over the following few hours  Hypotension, mix of medication induced and hypovolemia Plan Telemetry Keep euvolemic  Fluid and Electrolyte imbalance: hypophosphatemia (replaced) Plan Intermittent lab checks  Anemia (macrocytic) Plan Trend CBC Continue low molecular weight heparin  Stage II sacral ulcer  Plan Wound ostomy following  Hyperglycemia Plan Continue sliding scale; hold where it is for now given anticipate NPO status   GI prophylaxis:  Changed to ppi per ft 2/2 DVT prophylaxis : Pressure support ventilation Lovenox  Nutrition: Tube feeds Disposition: Keep in the intensive care CODE STATUS: Limited code  Family:  Discussed with brother, nephew at bedside and son Annie Main on [place]  Summary: Anxiety/encephalopathy superimposed on what appears to be severe airflow limitations resulting in air trapping and poor patient ventilator interaction. Looks ready to extubate. Son wants to be here as will be DNI after this.   My critical care times 35 minutes Erick Colace ACNP-BC [place] Pager # [phone] OR # [phone] if no  answer

## 2017-11-30 NOTE — Progress Notes (Signed)
eLink Physician-Brief Progress Note Patient Name: Angela Duncan DOB: 09/16/1951 MRN: [id number]   Date of Service  11/30/2017  HPI/Events of Note  Patient able to tolerate sips of water without issues.   eICU Interventions  Will make patient NPO except ice chips and sips with medications.      Intervention Category Major Interventions: Other:  Sommer,Steven Cornelia Copa 11/30/2017, 8:30 PM

## 2017-11-30 NOTE — Progress Notes (Signed)
[facility]  Patient Status: Active (receiving services up to time of hospitalization)  [facility] is providing the following services: PT  If patient discharges after hours, please call [phone].   Angela Duncan 11/30/2017, 11:11 AM

## 2017-11-30 NOTE — Procedures (Signed)
Extubation Procedure Note  Patient Details:   Name: Angela Duncan DOB: August 15, 1951 MRN: [id number]   Airway Documentation:     Evaluation  O2 sats: stable throughout Complications: No apparent complications Patient did tolerate procedure well. Bilateral Breath Sounds: Diminished   Yes  Pt tolerated well, family at bedside Cordella Register 11/30/2017, 11:40 AM

## 2017-12-01 LAB — BASIC METABOLIC PANEL
Anion gap: 4 — ABNORMAL LOW (ref 5–15)
BUN: 26 mg/dL — AB (ref 6–20)
CHLORIDE: 102 mmol/L (ref 101–111)
CO2: 33 mmol/L — ABNORMAL HIGH (ref 22–32)
Calcium: 8.3 mg/dL — ABNORMAL LOW (ref 8.9–10.3)
Creatinine, Ser: 0.36 mg/dL — ABNORMAL LOW (ref 0.44–1.00)
Glucose, Bld: 112 mg/dL — ABNORMAL HIGH (ref 65–99)
POTASSIUM: 4.3 mmol/L (ref 3.5–5.1)
SODIUM: 139 mmol/L (ref 135–145)

## 2017-12-01 LAB — GLUCOSE, CAPILLARY
GLUCOSE-CAPILLARY: 119 mg/dL — AB (ref 65–99)
GLUCOSE-CAPILLARY: 162 mg/dL — AB (ref 65–99)
GLUCOSE-CAPILLARY: 142 mg/dL — AB (ref 65–99)
Glucose-Capillary: 109 mg/dL — ABNORMAL HIGH (ref 65–99)

## 2017-12-01 LAB — MAGNESIUM: MAGNESIUM: 2.1 mg/dL (ref 1.7–2.4)

## 2017-12-01 LAB — PHOSPHORUS: PHOSPHORUS: 2.9 mg/dL (ref 2.5–4.6)

## 2017-12-01 MED ORDER — PANTOPRAZOLE SODIUM 40 MG PO PACK
40.0000 mg | PACK | Freq: Every day | ORAL | Status: DC
  Administered 2017-12-01: 40 mg via ORAL
  Filled 2017-12-01: qty 20

## 2017-12-01 MED ORDER — PREMIER PROTEIN SHAKE
11.0000 [oz_av] | ORAL | Status: DC
  Administered 2017-12-01 – 2017-12-06 (×6): 11 [oz_av] via ORAL
  Filled 2017-12-01 (×6): qty 325.31

## 2017-12-01 MED ORDER — QUETIAPINE FUMARATE 25 MG PO TABS
25.0000 mg | ORAL_TABLET | Freq: Every morning | ORAL | Status: DC
  Administered 2017-12-01 – 2017-12-06 (×6): 25 mg via ORAL
  Filled 2017-12-01 (×6): qty 1

## 2017-12-01 MED ORDER — FUROSEMIDE 10 MG/ML IJ SOLN
40.0000 mg | Freq: Once | INTRAMUSCULAR | Status: AC
  Administered 2017-12-01: 40 mg via INTRAVENOUS
  Filled 2017-12-01: qty 4

## 2017-12-01 MED ORDER — QUETIAPINE FUMARATE 25 MG PO TABS
50.0000 mg | ORAL_TABLET | Freq: Every day | ORAL | Status: DC
Start: 2017-12-01 — End: 2017-12-07
  Administered 2017-12-01 – 2017-12-06 (×6): 50 mg via ORAL
  Filled 2017-12-01: qty 2
  Filled 2017-12-01 (×2): qty 1
  Filled 2017-12-01 (×2): qty 2
  Filled 2017-12-01: qty 1

## 2017-12-01 MED ORDER — CLONAZEPAM 0.5 MG PO TBDP
0.5000 mg | ORAL_TABLET | Freq: Two times a day (BID) | ORAL | Status: DC
  Administered 2017-12-01 – 2017-12-06 (×12): 0.5 mg via ORAL
  Filled 2017-12-01 (×12): qty 1

## 2017-12-01 NOTE — Progress Notes (Signed)
Nutrition Follow-up  DOCUMENTATION CODES:   Not applicable  INTERVENTION:  - Will order Premier Protein BID, each supplement provides 160 kcal, 5 grams of carbohydrate, and 30 grams of protein.  - Encourage PO intakes. - RD will monitor for additional nutrition-related needs.   NUTRITION DIAGNOSIS:   Inadequate oral intake related to inability to eat as evidenced by NPO status. -diet advanced from NPO to Regular this AM  GOAL:   Patient will meet greater than or equal to 90% of their needs -unmet  MONITOR:   PO intake, Supplement acceptance, Weight trends, Labs  ASSESSMENT:   67 y.o. female with a history of asthma/COPD, HTN, hydrocephalus s/p VP shunt 2010, anxiety, stage 2 sacral pressure ulcer, and recent admission for hyponatremia thought to be due to HCTZ causing AMS discharged on 1/22 who presented to the ED by EMS for dyspnea associated with nonproductive cough and mild pleuritic chest pain.   Weight currently +19 lbs/8.9 kg compared to admission weight. Pt was extubated ~24 hours ago and OGT removed at that time. Diet was advanced from NPO to Regular this AM at 8:37 AM. Estimated nutrition needs updated based on extubation.    Medications reviewed; 40 mg IV Lasix x1 dose today, 40 mg Solu-medrol BID. Labs reviewed; CBGs: 109 and 119 mg/dL this AM, BUN: 26 mg/dL, creatinine: 0.36 mg/dL, Ca: 8.3 mg/dL.     Diet Order:  Diet regular Room service appropriate? Yes; Fluid consistency: Thin  EDUCATION NEEDS:   No education needs have been identified at this time  Skin:  Skin Assessment: Skin Integrity Issues: Skin Integrity Issues:: Stage I, Stage II Stage I: L leg Stage II: sacrum  Last BM:  2/4  Height:   Ht Readings from Last 1 Encounters:  11/25/17 5\' 2"  (1.575 m)    Weight:   Wt Readings from Last 1 Encounters:  12/01/17 141 lb 12.1 oz (64.3 kg)    Ideal Body Weight:  50 kg  BMI:  Body mass index is 25.93 kg/m.  Estimated Nutritional Needs:    Kcal:  1500-1800  Protein:  66-78 grams  Fluid:  1.2-1.5 L/day      Jarome Matin, MS, RD, LDN, [facility] Inpatient Clinical Dietitian Pager # [phone] After hours/weekend pager # [phone]

## 2017-12-01 NOTE — Evaluation (Signed)
Physical Therapy Evaluation Patient Details Name: Angela Duncan MRN: [id number] DOB: 08-17-1951 Today's Date: 12/01/2017   History of Present Illness  Patient is a 67 y/o female admitted 11/24/17 with COPD exacerbation requiring ventilator. extubated 11/30/17.  PMH positive for HTN, [facility] Spotted Fever 2010, VP shunt for hydrocephalus 2010. stress induced cardiomyopathy, asthma, anxiety, delerium, hallucinations and paranoia, Recently discharged to stay with brother.  Clinical Impression  The patient tolerated sitting at the bed edge with 2 assist, O2 saturation > 94% on 3 liters. The patient has decreased P/AROM of both ankles( Left more so than right) Pt admitted with above diagnosis. Pt currently with functional limitations due to the deficits listed below (see PT Problem List).  Pt will benefit from skilled PT to increase their independence and safety with mobility to allow discharge to the venue listed below.       Follow Up Recommendations SNF;Supervision/Assistance - 24 hour    Equipment Recommendations  None recommended by PT    Recommendations for Other Services OT consult     Precautions / Restrictions Precautions Precautions: Fall Precaution Comments: oxygen dependent, tight heelcords L > R      Mobility  Bed Mobility Overal bed mobility: Needs Assistance Bed Mobility: Supine to Sit;Sit to Supine     Supine to sit: +2 for physical assistance;+2 for safety/equipment;Total assist Sit to supine: +2 for safety/equipment;+2 for physical assistance;Total assist   General bed mobility comments: performed by PT, self moved the legs a little. use of bed pad to move to edge and back into bed  Transfers                 General transfer comment: did not attempt due to weakness of legs, high bed and patient short stature  Ambulation/Gait Ambulation/Gait assistance: Modified independent (Device/Increase time)              Stairs             Wheelchair Mobility    Modified Rankin (Stroke Patients Only)       Balance Overall balance assessment: Needs assistance   Sitting balance-Leahy Scale: Poor Sitting balance - Comments: sat at bed edge x 10 minutes with trunk at midline and feet supported                                     Pertinent Vitals/Pain Pain Assessment: Faces Faces Pain Scale: Hurts little more Pain Location: generalized by patient's facial responses Pain Descriptors / Indicators: Discomfort;Grimacing Pain Intervention(s): Monitored during session    Home Living Family/patient expects to be discharged to:: Unsure Living Arrangements: Other relatives               Additional Comments: recently DC to live with brother    Prior Function           Comments: 2 weeks ago was transfers only with 1 assist.     Hand Dominance        Extremity/Trunk Assessment   Upper Extremity Assessment Upper Extremity Assessment: RUE deficits/detail;LUE deficits/detail;Generalized weakness LUE Deficits / Details: does not keep arm elevated when placed at 90*, drops arm    Lower Extremity Assessment Lower Extremity Assessment: RLE deficits/detail;LLE deficits/detail RLE Deficits / Details: lacks neutral dorsiflexion, grossly 2+/ knee extension, 2 /5dorsiflexion LLE Deficits / Details:  tightness of heelcord with decreased dorsiflexion  from neutral. strength 2/5 dorsiflexion. did stretch  close to  neutral with weight through the foot while sitting.    Cervical / Trunk Assessment Cervical / Trunk Assessment: Kyphotic  Communication   Communication: Other (comment)(voice is raspy and difficult to understand.)  Cognition Arousal/Alertness: Awake/alert   Overall Cognitive Status: No family/caregiver present to determine baseline cognitive functioning                                 General Comments: difficult to assess. speech is difficult to understand. oriented to  self      General Comments      Exercises     Assessment/Plan    PT Assessment Patient needs continued PT services  PT Problem List Decreased strength;Decreased mobility;Decreased safety awareness;Decreased activity tolerance;Decreased balance;Decreased knowledge of use of DME;Decreased cognition;Decreased knowledge of precautions       PT Treatment Interventions DME instruction;Therapeutic activities;Therapeutic exercise;Cognitive remediation;Patient/family education;Balance training;Functional mobility training    PT Goals (Current goals can be found in the Care Plan section)  Acute Rehab PT Goals Patient Stated Goal: agreed to sitting PT Goal Formulation: With family Time For Goal Achievement: 12/15/17 Potential to Achieve Goals: Fair    Frequency Min 2X/week   Barriers to discharge        Co-evaluation               AM-PAC PT "6 Clicks" Daily Activity  Outcome Measure Difficulty turning over in bed (including adjusting bedclothes, sheets and blankets)?: Unable Difficulty moving from lying on back to sitting on the side of the bed? : Unable Difficulty sitting down on and standing up from a chair with arms (e.g., wheelchair, bedside commode, etc,.)?: Unable Help needed moving to and from a bed to chair (including a wheelchair)?: Total Help needed walking in hospital room?: Total Help needed climbing 3-5 steps with a railing? : Total 6 Click Score: 6    End of Session Equipment Utilized During Treatment: Oxygen Activity Tolerance: Patient tolerated treatment well Patient left: in bed;with call bell/phone within reach;with bed alarm set Nurse Communication: Mobility status PT Visit Diagnosis: Muscle weakness (generalized) (M62.81);Other abnormalities of gait and mobility (R26.89)    Time: 2876-8115 PT Time Calculation (min) (ACUTE ONLY): 28 min   Charges:   PT Evaluation $PT Eval Moderate Complexity: 1 Mod PT Treatments $Therapeutic Activity: 8-22  mins   PT G CodesTresa Endo PT [phone]   Claretha Cooper 12/01/2017, 12:13 PM

## 2017-12-01 NOTE — Progress Notes (Signed)
.. ..  Name: DECLYN OFFIELD MRN: [id number] DOB: [birth year]    ADMISSION DATE:  11/24/2017 CONSULTATION DATE: 11/24/2017  REFERRING MD : [facility]- GRUNZ MD  CHIEF COMPLAINT: Shortness of breath  BRIEF PATIENT DESCRIPTION: 67 yowf  Former smoker (listed as never smoker in [place] but clarified by fm 11/27/17 as quit "years ago")   with a past medical history of asthma, COPD on BREO , hypertension on ACEi, status post VP shunt in 2010 for hydrocephalus, anxiety, stage II sacral pressure ulcer presents with dyspnea found to be in acute exacerbation of COPD guarded on noninvasive positive pressure ventilation.  PCCM consulted Due to concern of mental status/somnolence while on BiPAP.   SIGNIFICANT EVENTS  Dyspnea requiring BiPAP on admit Intubated 2/1>>>2/5    Culture data  - BC x  1/30 >>> - Viral panel 1/30 >>> pos RSV Antibiotics - Zmax 1/30 >> completed 1.5 g   SUBJECTIVE:  Looks improved in comparison to day prior tolerated extubation  VITAL SIGNS: Temp:  [97.7 F (36.5 C)-100.9 F (38.3 C)] 97.7 F (36.5 C) (02/06 0600) Pulse Rate:  [53-117] 68 (02/06 0600) Resp:  [15-25] 19 (02/06 0600) BP: (94-126)/(42-66) 117/53 (02/06 0600) SpO2:  [95 %-100 %] 100 % (02/06 0726) FiO2 (%):  [30 %] 30 % (02/05 1118) Weight:  [141 lb 12.1 oz (64.3 kg)] 141 lb 12.1 oz (64.3 kg) (02/06 0500)    Wt Readings from Last 3 Encounters:  12/01/17 141 lb 12.1 oz (64.3 kg)  11/15/17 149 lb 0.5 oz (67.6 kg)  01/14/15 138 lb (62.6 kg)     Intake/Output Summary (Last 24 hours) at 12/01/2017 0837 Last data filed at 12/01/2017 0600 Gross per 24 hour  Intake 490.68 ml  Output 885 ml  Net -394.32 ml      CVP:  [0 mmHg-8 mmHg] 4 mmHg   Physical exam General: This is a 67 year old female who appears much older than stated age.  She is now extubated, anxious, but has tolerated extubation thus far. HEENT: Normocephalic does have some temporal wasting she is edentulous.  Mucous membranes are  moist.  No jugular venous distention Pulmonary: Still some work of breathing, prolonged expiratory wheeze, no rales or rhonchi, strong cough, phonation has remarkably improved in comparison to immediately post extubation Cardiac: Regular rate and rhythm Extremities/musculoskeletal: Strong, equal strength.  Has scattered areas of ecchymosis over upper extremities.  She has developed some mild anasarca particularly in the dependent extremities Abdomen: Soft nontender no organomegaly Neuro/psych: Awake, anxious, moving all extremities still a little confused CBC Recent Labs    11/29/17 0500  WBC 9.0  HGB 8.9*  HCT 26.4*  PLT 314    Coag's No results for input(s): APTT, INR in the last 72 hours.  BMET Recent Labs    11/29/17 0500 12/01/17 0547  NA 140 139  K 3.9 4.3  CL 105 102  CO2 31 33*  BUN 29* 26*  CREATININE 0.51 0.36*  GLUCOSE 173* 112*    Electrolytes Recent Labs    11/29/17 0500 12/01/17 0547  CALCIUM 8.2* 8.3*  MG 1.9 2.1  PHOS 2.1* 2.9    Sepsis Markers No results for input(s): PROCALCITON, O2SATVEN in the last 72 hours.  Invalid input(s): LACTICACIDVEN  ABG No results for input(s): PHART, PCO2ART, PO2ART in the last 72 hours.  Liver Enzymes No results for input(s): AST, ALT, ALKPHOS, BILITOT, ALBUMIN in the last 72 hours.  Cardiac Enzymes No results for input(s): TROPONINI, PROBNP in the last 72  hours.  Glucose Recent Labs    11/30/17 1203 11/30/17 1602 11/30/17 1922 11/30/17 2307 12/01/17 0306 12/01/17 0819  GLUCAP 108* 127* 94 114* 109* 119*    Imaging No results found.    ASSESSMENT / PLAN: Resolved problem list: Hypotension Hyperglycemia   Current problem list  Acute on chronic hypercarbic & hypoxic respiratory failure in setting of acute RSV infection resulting in acute exacerbation of chronic obstructive pulmonary disease  -air trapping exacerbated by anxiety +/- what is likely baseline cognitive limitations. Volumes  acceptable on pressure support ventilation Extubated 2/5; slowly improving. Still / accessory use BUT think that this is close to baseline  Plan Mobilize Wean oxygen  Lasix x1 Cont nebs and ICS No change in systemic steroids today No more Ace-I DNR Would benefit from hospice. I will talk further about this.    Acute encephalopathy; presumed mix of History of hypercarbia/hypoxia and metabolic in origin. Superimpose on h/o hydrocephalus status post VP shunt Severe anxiety ->slowly improving  Plan Cont bid clonazepam Change Seroquel to 25 in am and 50 at HS Dc precedex Optimize sleep wake cycle OOB  Severe deconditioning Plan OOB PT consult   Fluid and Electrolyte imbalance: hypophosphatemia (replaced) Plan Replace and recheck   Anemia (macrocytic) Plan Trend cbc Transfuse per protocol   Stage II sacral ulcer  Plan Cont pressure release interventions    GI prophylaxis:  Cont ppi DVT prophylaxis : cont lmwh Nutrition: reg diet  Disposition: sdu another 24 hrs CODE STATUS: dnr Family:  Discussed with brother, nephew at bedside and son Annie Main on [place]  Summary:  Now extubated.  She is very debilitated, and I explained to the family at this point the hope is to slowly recover.  She has very little respiratory reserve and it would take very little for her to become acutely ill once again.  For now the focus is on rehabilitation.  Mobilizing today, starting diet, adding some diuretics.  Hopefully can begin to taper steroids soon.  I will keep her in the stepdown unit overnight, if she continues to improve I think she can move to MedSurg in the next 24 hours she is a full DNR.  Erick Colace ACNP-BC [place] Pager # [phone] OR # [phone] if no answer

## 2017-12-02 LAB — BASIC METABOLIC PANEL
Anion gap: 6 (ref 5–15)
BUN: 22 mg/dL — AB (ref 6–20)
CO2: 35 mmol/L — ABNORMAL HIGH (ref 22–32)
Calcium: 8.5 mg/dL — ABNORMAL LOW (ref 8.9–10.3)
Chloride: 98 mmol/L — ABNORMAL LOW (ref 101–111)
Creatinine, Ser: 0.34 mg/dL — ABNORMAL LOW (ref 0.44–1.00)
GFR calc Af Amer: >60 mL/min (ref >60)
Glucose, Bld: 122 mg/dL — ABNORMAL HIGH (ref 65–99)
POTASSIUM: 4 mmol/L (ref 3.5–5.1)
SODIUM: 139 mmol/L (ref 135–145)

## 2017-12-02 LAB — GLUCOSE, CAPILLARY: GLUCOSE-CAPILLARY: 122 mg/dL — AB (ref 65–99)

## 2017-12-02 MED ORDER — METOPROLOL TARTRATE 25 MG PO TABS
25.0000 mg | ORAL_TABLET | Freq: Two times a day (BID) | ORAL | Status: DC
  Administered 2017-12-02 – 2017-12-06 (×10): 25 mg via ORAL
  Filled 2017-12-02 (×10): qty 1

## 2017-12-02 MED ORDER — HYDRALAZINE HCL 20 MG/ML IJ SOLN
5.0000 mg | INTRAMUSCULAR | Status: DC | PRN
  Administered 2017-12-02: 5 mg via INTRAVENOUS
  Filled 2017-12-02: qty 1

## 2017-12-02 MED ORDER — AMLODIPINE BESYLATE 5 MG PO TABS: 5.0000 mg | ORAL_TABLET | Freq: Every day | ORAL | Status: DC

## 2017-12-02 MED ORDER — FUROSEMIDE 20 MG PO TABS
20.0000 mg | ORAL_TABLET | Freq: Every day | ORAL | Status: DC
  Administered 2017-12-02 – 2017-12-04 (×3): 20 mg via ORAL
  Filled 2017-12-02 (×3): qty 1

## 2017-12-02 MED ORDER — PANTOPRAZOLE SODIUM 40 MG PO TBEC
40.0000 mg | DELAYED_RELEASE_TABLET | Freq: Every day | ORAL | Status: DC
  Administered 2017-12-02 – 2017-12-06 (×5): 40 mg via ORAL
  Filled 2017-12-02 (×5): qty 1

## 2017-12-02 MED ORDER — SENNOSIDES-DOCUSATE SODIUM 8.6-50 MG PO TABS
2.0000 | ORAL_TABLET | Freq: Two times a day (BID) | ORAL | Status: DC
  Administered 2017-12-02 – 2017-12-06 (×9): 2 via ORAL
  Filled 2017-12-02 (×9): qty 2

## 2017-12-02 MED ORDER — CHLORHEXIDINE GLUCONATE 0.12 % MT SOLN
15.0000 mL | Freq: Two times a day (BID) | OROMUCOSAL | Status: DC
  Administered 2017-12-02 – 2017-12-06 (×10): 15 mL via OROMUCOSAL
  Filled 2017-12-02 (×9): qty 15

## 2017-12-02 MED ORDER — ORAL CARE MOUTH RINSE
15.0000 mL | Freq: Two times a day (BID) | OROMUCOSAL | Status: DC
  Administered 2017-12-02 – 2017-12-04 (×3): 15 mL via OROMUCOSAL

## 2017-12-02 NOTE — Progress Notes (Signed)
PROGRESS NOTE  Angela Duncan MVH:[id number] DOB: [birth year] DOA: 11/24/2017 PCP: Patient, No Pcp Per   LOS: 8 days   Brief Narrative / Interim history: 67 year old female with history of asthma, end-stage COPD, former smoker, hypertension, hydrocephalus status post VP shunt in 2010, anxiety, was admitted to the hospital on 1/30 with acute hypoxic and hypercarbic respiratory failure in the setting of COPD exacerbation.  Patient was initially admitted to the hospitalist service, however shortly after admission she decompensated, her breathing got progressively worse requiring transfer to ICU and she was placed on ventilatory support on 2/1.  She was extubated on 2/5.  Her respiratory virus panel was positive only for RSV.  She was transferred back to the hospitalist service on 2/7.  Assessment & Plan: Principal Problem:   Acute respiratory failure (HCC) Active Problems:   Hyperlipidemia LDL goal <100   Essential hypertension   COPD with asthma (HCC)   Altered mental status   Pressure injury of skin   COPD exacerbation (HCC)   Encounter for central line placement   Shock circulatory (HCC)    COPD exacerbation with acute on chronic hypercarbic and hypoxic respiratory failure/ventilatory dependent respiratory failure/acute RSV infection -Successfully extubated on 2/5, transfer to the hospitalist service on 2/7, slowly improving -Wean oxygen as tolerated, PT consult -PCCM discussed with family, recommending hospice/palliative care consultation.  Will order -For now with Pulmicort, duo nebs, steroids,  Acute encephalopathy/severe anxiety -In the setting of #1, mental status slowly improving -Continue Seroquel -Required Precedex, this was discontinued 2/6  Hypertension -She is on Norvasc, Lasix, lisinopril, metoprolol at home, resume today metoprolol and Lasix, add hydralazine as needed  Chronic diastolic CHF -Most recent 2D echo as below during this hospitalization, ejection  fraction 84-13%, grade 1 diastolic dysfunction -Closely monitor fluid status, she is up 1.6 L, will resume Lasix today.  She does not look fluid overloaded  Stage II sacral pressure ulcer -Appreciate wound care evaluation, continue local care  History of hydrocephalus  -status post VP shunt  History of hyponatremia -Thought to be due to HCTZ, now discontinued, sodium is normal   DVT prophylaxis: Lovenox Code Status: DNR Family Communication: no family at bedside Disposition Plan: TBD, remain in SDU  Consultants:   PCCM  Procedures:   2D echo:  Impressions: - LVEF 50-55%, normal wall thickness, grade 1 DD, high LV filling pressure, normal LA size, mild TR, RVSP 30 mmHg, normal IVC, small pericardial effusion without tamponade features  Antimicrobials:  Azithromycin 1/31 -finished a course  Subjective: -Feels a little bit better, however still short of breath with minimal activity.  No chest pain, no abdominal pain, nausea or vomiting  Objective: Vitals:   12/02/17 0833 12/02/17 0900 12/02/17 1000 12/02/17 1040  BP: (!) 177/72   (!) 128/92  Pulse:  (!) 115 (!) 104 (!) 109  Resp:   20 19  Temp:  98.6 F (37 C) 97.9 F (36.6 C) 98.8 F (37.1 C)  TempSrc:      SpO2:  96% 98% 98%  Weight:      Height:        Intake/Output Summary (Last 24 hours) at 12/02/2017 1224 Last data filed at 12/02/2017 0501 Gross per 24 hour  Intake 300 ml  Output 1185 ml  Net -885 ml   Filed Weights   11/30/17 0458 12/01/17 0500 12/02/17 0456  Weight: 62.9 kg (138 lb 10.7 oz) 64.3 kg (141 lb 12.1 oz) 64.9 kg (143 lb 1.3 oz)    Examination:  Constitutional: Appears tachypneic, mild distress Eyes:  lids and conjunctivae normal ENMT: Mucous membranes are moist. Neck: normal, supple Respiratory: Very distant breath sounds, no wheezing, no crackles.  Increased respiratory effort with accessory muscle use Cardiovascular: Regular rate and rhythm, no murmurs / rubs / gallops. No LE edema.  2+ pedal pulses. Abdomen: no tenderness. Bowel sounds positive.  Skin: no rashes Neurologic: Nonfocal, follows commands well   Data Reviewed: I have independently reviewed following labs and imaging studies  Chest x-ray 2/4 -No infiltrates  CBC: Recent Labs  Lab 11/25/17 1241 11/26/17 0301 11/27/17 0435 11/27/17 2049 11/29/17 0500  WBC 6.8 6.9 15.7* 13.8* 9.0  HGB 8.8* 10.7* 10.5* 9.5* 8.9*  HCT 26.4* 33.2* 31.8* 28.9* 26.4*  MCV 105.2* 107.4* 105.0* 105.1* 105.2*  PLT 292 278 397 385 193   Basic Metabolic Panel: Recent Labs  Lab 11/27/17 0435 11/27/17 0626 11/27/17 1709 11/28/17 0409 11/29/17 0500 12/01/17 0547 12/02/17 0500  NA 137 137  --   --  140 139 139  K >7.5* 4.5  --   --  3.9 4.3 4.0  CL 96* 98*  --   --  105 102 98*  CO2 33* 33*  --   --  31 33* 35*  GLUCOSE 165* 180*  --   --  173* 112* 122*  BUN 33* 34*  --   --  29* 26* 22*  CREATININE 0.86 0.84  --   --  0.51 0.36* 0.34*  CALCIUM 5.5* 8.5*  --   --  8.2* 8.3* 8.5*  MG 1.4*  --  2.0 2.3 1.9 2.1  --   PHOS 2.7  --  1.8* 2.3* 2.1* 2.9  --    GFR: Estimated Creatinine Clearance: 61.2 mL/min (A) (by C-G formula based on SCr of 0.34 mg/dL (L)). Liver Function Tests: No results for input(s): AST, ALT, ALKPHOS, BILITOT, PROT, ALBUMIN in the last 168 hours. No results for input(s): LIPASE, AMYLASE in the last 168 hours. No results for input(s): AMMONIA in the last 168 hours. Coagulation Profile: No results for input(s): INR, PROTIME in the last 168 hours. Cardiac Enzymes: No results for input(s): CKTOTAL, CKMB, CKMBINDEX, TROPONINI in the last 168 hours. BNP (last 3 results) No results for input(s): PROBNP in the last 8760 hours. HbA1C: No results for input(s): HGBA1C in the last 72 hours. CBG: Recent Labs  Lab 12/01/17 0306 12/01/17 0819 12/01/17 1309 12/01/17 1526 12/02/17 0737  GLUCAP 109* 119* 162* 142* 122*   Lipid Profile: No results for input(s): CHOL, HDL, LDLCALC, TRIG, CHOLHDL,  LDLDIRECT in the last 72 hours. Thyroid Function Tests: No results for input(s): TSH, T4TOTAL, FREET4, T3FREE, THYROIDAB in the last 72 hours. Anemia Panel: No results for input(s): VITAMINB12, FOLATE, FERRITIN, TIBC, IRON, RETICCTPCT in the last 72 hours. Urine analysis:    Component Value Date/Time   COLORURINE YELLOW 11/10/2017 0438   APPEARANCEUR CLEAR 11/10/2017 0438   LABSPEC 1.011 11/10/2017 0438   PHURINE 6.0 11/10/2017 0438   GLUCOSEU 50 (A) 11/10/2017 0438   HGBUR SMALL (A) 11/10/2017 0438   BILIRUBINUR NEGATIVE 11/10/2017 0438   BILIRUBINUR neg 07/28/2013 0939   KETONESUR 5 (A) 11/10/2017 0438   PROTEINUR NEGATIVE 11/10/2017 0438   UROBILINOGEN 0.2 07/28/2013 0939   UROBILINOGEN 0.2 02/20/2011 1118   NITRITE NEGATIVE 11/10/2017 0438   LEUKOCYTESUR NEGATIVE 11/10/2017 0438   Sepsis Labs: Invalid input(s): PROCALCITONIN, LACTICIDVEN  Recent Results (from the past 240 hour(s))  Culture, blood (routine x 2)  Status: None   Collection Time: 11/24/17  5:17 PM  Result Value Ref Range Status   Specimen Description   Final    BLOOD RIGHT ANTECUBITAL Performed at [facility] [address] [place], [place] [address]    Special Requests   Final    BOTTLES DRAWN AEROBIC ONLY Blood Culture adequate volume Performed at [facility] [address], [place], [place] [address]    Culture   Final    NO GROWTH 5 DAYS Performed at [facility], [place] [address] [place], [place] [address]    Report Status 11/29/2017 FINAL  Final  Culture, blood (routine x 2)     Status: None   Collection Time: 11/24/17  5:43 PM  Result Value Ref Range Status   Specimen Description   Final    BLOOD LEFT ANTECUBITAL Performed at [facility] [address], [place], [place] [address]    Special Requests   Final    BOTTLES DRAWN AEROBIC AND ANAEROBIC Blood Culture adequate volume Performed at [facility] [address] [place], [place] [address]    Culture   Final    NO GROWTH 5 DAYS Performed at [facility], [place] [address] [place], [place] [address]    Report Status 11/29/2017 FINAL  Final  Respiratory Panel by PCR     Status: Abnormal   Collection Time: 11/24/17 10:43 PM  Result Value Ref Range Status   Adenovirus NOT DETECTED NOT DETECTED Final   Coronavirus 229E NOT DETECTED NOT DETECTED Final   Coronavirus HKU1 NOT DETECTED NOT DETECTED Final   Coronavirus NL63 NOT DETECTED NOT DETECTED Final   Coronavirus OC43 NOT DETECTED NOT DETECTED Final   Metapneumovirus NOT DETECTED NOT DETECTED Final   Rhinovirus / Enterovirus NOT DETECTED NOT DETECTED Final   Influenza A NOT DETECTED NOT DETECTED Final   Influenza B NOT DETECTED NOT DETECTED Final   Parainfluenza Virus 1 NOT DETECTED NOT DETECTED Final   Parainfluenza Virus 2 NOT DETECTED NOT DETECTED Final   Parainfluenza Virus 3 NOT DETECTED NOT DETECTED Final   Parainfluenza Virus 4 NOT DETECTED NOT DETECTED Final   Respiratory Syncytial Virus DETECTED (A) NOT DETECTED Final    Comment: CRITICAL RESULT CALLED TO, READ BACK BY AND VERIFIED WITH: Sharyn Creamer RN 13:25 11/25/17 ([email or web address])    Bordetella pertussis NOT DETECTED NOT DETECTED Final   Chlamydophila pneumoniae NOT DETECTED NOT DETECTED Final   Mycoplasma pneumoniae NOT DETECTED NOT DETECTED Final    Comment: Performed at [facility], [place] [address] [place], [place] [address]  MRSA PCR Screening     Status: None   Collection Time: 11/24/17 10:43 PM  Result Value Ref Range Status   MRSA by PCR NEGATIVE NEGATIVE Final    Comment:        The GeneXpert MRSA Assay (FDA approved for NASAL specimens only), is one component of a comprehensive MRSA colonization surveillance program. It is not intended to diagnose MRSA infection nor to guide or monitor treatment for MRSA infections.       Radiology Studies: No results found.   Scheduled Meds: .  budesonide (PULMICORT) nebulizer solution  0.5 mg Nebulization BID  . clonazepam  0.5 mg Oral BID  . enoxaparin (LOVENOX) injection  40 mg Subcutaneous QHS  . ipratropium-albuterol  3 mL Nebulization Q4H  . methylPREDNISolone (SOLU-MEDROL) injection  40 mg Intravenous Q12H  . pantoprazole  40 mg Oral Daily  .  protein supplement shake  11 oz Oral Q24H  . QUEtiapine  25 mg Oral q morning - 10a  . QUEtiapine  50 mg Oral QHS  . senna-docusate  2 tablet Oral BID   Continuous Infusions:   Marzetta Board, MD, PhD [facility] Pager [phone]  If 7PM-7AM, please contact night-coverage [email or web address] Password [facility] 12/02/2017, 12:24 PM

## 2017-12-02 NOTE — Progress Notes (Signed)
.. ..  Name: Angela Duncan MRN: [id number] DOB: 05-Apr-1951    ADMISSION DATE:  11/24/2017 CONSULTATION DATE: 11/24/2017  REFERRING MD : [facility] hospitalist- GRUNZ MD  CHIEF COMPLAINT: Shortness of breath  BRIEF PATIENT DESCRIPTION: 67 yowf  Former smoker (listed as never smoker in [place] but clarified by fm 11/27/17 as quit "years ago")   with a past medical history of asthma, COPD on BREO , hypertension on ACEi, status post VP shunt in 2010 for hydrocephalus, anxiety, stage II sacral pressure ulcer presents with dyspnea found to be in acute exacerbation of COPD guarded on noninvasive positive pressure ventilation.  PCCM consulted Due to concern of mental status/somnolence while on BiPAP.   SIGNIFICANT EVENTS  Dyspnea requiring BiPAP on admit Intubated 2/1>>>2/5    Culture data  - BC x  1/30 >>> - Viral panel 1/30 >>> pos RSV Antibiotics - Zmax 1/30 >> completed 1.5 g   SUBJECTIVE:  Continues to improve  VITAL SIGNS: Temp:  [97.7 F (36.5 C)-100 F (37.8 C)] 99 F (37.2 C) (02/07 0000) Pulse Rate:  [74-105] 98 (02/07 0000) Resp:  [14-23] 18 (02/07 0000) BP: (103-177)/(46-126) 177/72 (02/07 0833) SpO2:  [98 %-100 %] 100 % (02/07 0743) Weight:  [143 lb 1.3 oz (64.9 kg)] 143 lb 1.3 oz (64.9 kg) (02/07 0456)    Wt Readings from Last 3 Encounters:  12/02/17 143 lb 1.3 oz (64.9 kg)  11/15/17 149 lb 0.5 oz (67.6 kg)  01/14/15 138 lb (62.6 kg)     Intake/Output Summary (Last 24 hours) at 12/02/2017 0941 Last data filed at 12/02/2017 0501 Gross per 24 hour  Intake 663.19 ml  Output 1985 ml  Net -1321.81 ml      CVP:  [0 mmHg-30 mmHg] 0 mmHg   Physical exam General: 67 year old white female currently awake, oriented, anxious. HEENT: Normocephalic atraumatic no jugular venous distention she is edentulous. Pulmonary: Improved aeration, expiratory wheeze, mild accessory use Cardiac: Regular rate and rhythm Abdomen: Soft nontender Extremities: Warm dry, scattered areas of  ecchymosis, some anasarca. Neuro/psych: Awake oriented probably close to baseline moves all extremities very anxious CBC No results for input(s): WBC, HGB, HCT, PLT in the last 72 hours.  Coag's No results for input(s): APTT, INR in the last 72 hours.  BMET Recent Labs    12/01/17 0547 12/02/17 0500  NA 139 139  K 4.3 4.0  CL 102 98*  CO2 33* 35*  BUN 26* 22*  CREATININE 0.36* 0.34*  GLUCOSE 112* 122*    Electrolytes Recent Labs    12/01/17 0547 12/02/17 0500  CALCIUM 8.3* 8.5*  MG 2.1  --   PHOS 2.9  --     Sepsis Markers No results for input(s): PROCALCITON, O2SATVEN in the last 72 hours.  Invalid input(s): LACTICACIDVEN  ABG No results for input(s): PHART, PCO2ART, PO2ART in the last 72 hours.  Liver Enzymes No results for input(s): AST, ALT, ALKPHOS, BILITOT, ALBUMIN in the last 72 hours.  Cardiac Enzymes No results for input(s): TROPONINI, PROBNP in the last 72 hours.  Glucose Recent Labs    11/30/17 2307 12/01/17 0306 12/01/17 0819 12/01/17 1309 12/01/17 1526 12/02/17 0737  GLUCAP 114* 109* 119* 162* 142* 122*    Imaging No results found.    ASSESSMENT / PLAN: Resolved problem list: Hypotension Hyperglycemia   Current problem list  Acute on chronic hypercarbic & hypoxic respiratory failure in setting of acute RSV infection resulting in acute exacerbation of chronic obstructive pulmonary disease  -air trapping exacerbated  by anxiety +/- what is likely baseline cognitive limitations. Volumes acceptable on pressure support ventilation Extubated 2/5; slowly improving. Still / accessory use BUT think that this is close to baseline  Plan Mobilize Wean oxygen Continue nebulizers and inhaled corticosteroids No more ACE inhibitor Wean steroids DNR Will discuss hospice with family.  I think she benefit from this    Acute encephalopathy; presumed mix of History of hypercarbia/hypoxia and metabolic in origin. Superimpose on h/o hydrocephalus  status post VP shunt Severe anxiety ->slowly improving  Plan Continue clonazepam, continue Seroquel Focus on rehab Mobilize  Severe deconditioning Plan Out of bed  Fluid and Electrolyte imbalance: hypophosphatemia (replaced) Plan Replace and recheck as needed  Anemia (macrocytic) Plan Trend CBC and transfuse per protocol  Stage II sacral ulcer  Plan To do pressure release interventions   GI prophylaxis:  Cont ppi DVT prophylaxis : cont lmwh Nutrition: reg diet  Disposition: sdu another 24 hrs CODE STATUS: dnr Family:  Discussed with brother, nephew at bedside and son Annie Main on [place]  Summary:  Now extubated.  She is very debilitated, and I explained to the family at this point the hope is to slowly recover.  She has very little respiratory reserve and it would take very little for her to become acutely ill once again.  For now the focus is on rehabilitation.  Mobilizing today, starting diet, adding some diuretics.  Hopefully can begin to taper steroids soon.  Now on [facility] service.  I will talk to family once again in regards to hospice recommendations.  I think we should have palliative see her while she is here  Erick Colace ACNP-BC [place] Pager # [phone] OR # [phone] if no answer

## 2017-12-03 ENCOUNTER — Inpatient Hospital Stay (HOSPITAL_COMMUNITY): Payer: Medicare HMO

## 2017-12-03 DIAGNOSIS — M7989 Other specified soft tissue disorders: Secondary | ICD-10-CM

## 2017-12-03 MED ORDER — SALINE SPRAY 0.65 % NA SOLN
1.0000 | NASAL | Status: DC | PRN
  Filled 2017-12-03: qty 44

## 2017-12-03 MED ORDER — TRAMADOL HCL 50 MG PO TABS
25.0000 mg | ORAL_TABLET | Freq: Two times a day (BID) | ORAL | Status: DC | PRN
  Administered 2017-12-03 – 2017-12-06 (×4): 25 mg via ORAL
  Filled 2017-12-03 (×4): qty 1

## 2017-12-03 NOTE — Progress Notes (Signed)
Physical Therapy Treatment Patient Details Name: Angela Duncan MRN: [id number] DOB: 04-08-1951 Today's Date: 12/03/2017    History of Present Illness Patient is a 67 y/o female admitted 11/24/17 with COPD exacerbation requiring ventilator. extubated 11/30/17.  PMH positive for HTN, [facility] Spotted Fever 2010, VP shunt for hydrocephalus 2010. stress induced cardiomyopathy, asthma, anxiety, delerium, hallucinations and paranoia, Recently discharged to stay with brother.    PT Comments    Pt is progressing toward goals; pleased to be up in chair; will continue to follow   Follow Up Recommendations  SNF;Supervision/Assistance - 24 hour     Equipment Recommendations  None recommended by PT    Recommendations for Other Services       Precautions / Restrictions Precautions Precautions: Fall Precaution Comments: oxygen dependent, tight heelcords L > R Restrictions Weight Bearing Restrictions: No    Mobility  Bed Mobility Overal bed mobility: Needs Assistance Bed Mobility: Supine to Sit     Supine to sit: Mod assist     General bed mobility comments: facilitation LEs, assist to elevate trunk, bed pad used to assist scooting  Transfers Overall transfer level: Needs assistance   Transfers: Lateral/Scoot Transfers          Lateral/Scoot Transfers: Max assist General transfer comment: lateral scoot bed to chair transfers using bed pad to assist pt; pt is abl eto self assist using UEs with step by step cues  Ambulation/Gait                 Stairs            Wheelchair Mobility    Modified Rankin (Stroke Patients Only)       Balance     Sitting balance-Leahy Scale: Good Sitting balance - Comments: pt is able to laterally wt shift with min/guard in preparation for scooting to EOB                                    Cognition Arousal/Alertness: Awake/alert Behavior During Therapy: WFL for tasks assessed/performed Overall  Cognitive Status: No family/caregiver present to determine baseline cognitive functioning                   Orientation Level: Disoriented to;Time;Situation Current Attention Level: Sustained     Safety/Judgement: Decreased awareness of safety;Decreased awareness of deficits     General Comments:  speech is difficult to understand at times; pt converses appropriately durin ng session      Exercises  LAQs bil x 10 seated EOB bil heel cord stretch x5    General Comments        Pertinent Vitals/Pain Pain Assessment: No/denies pain    Home Living                      Prior Function            PT Goals (current goals can now be found in the care plan section) Acute Rehab PT Goals Patient Stated Goal: agreed to PT Time For Goal Achievement: 12/15/17 Potential to Achieve Goals: Fair Progress towards PT goals: Progressing toward goals    Frequency    Min 2X/week      PT Plan Current plan remains appropriate    Co-evaluation              AM-PAC PT "6 Clicks" Daily Activity  Outcome Measure  Difficulty turning over in bed (  including adjusting bedclothes, sheets and blankets)?: Unable Difficulty moving from lying on back to sitting on the side of the bed? : Unable Difficulty sitting down on and standing up from a chair with arms (e.g., wheelchair, bedside commode, etc,.)?: Unable Help needed moving to and from a bed to chair (including a wheelchair)?: Total Help needed walking in hospital room?: Total Help needed climbing 3-5 steps with a railing? : Total 6 Click Score: 6    End of Session Equipment Utilized During Treatment: Gait belt Activity Tolerance: Patient tolerated treatment well Patient left: in chair;with call bell/phone within reach;with chair alarm set   PT Visit Diagnosis: Muscle weakness (generalized) (M62.81);Other abnormalities of gait and mobility (R26.89)     Time: 2423-5361 PT Time Calculation (min) (ACUTE ONLY): 29  min  Charges:  $Therapeutic Activity: 23-37 mins                    G CodesKenyon Ana, PT Pager: [phone] 12/03/2017    Kenyon Ana 12/03/2017, 12:11 PM

## 2017-12-03 NOTE — Consult Note (Addendum)
Met with Angela Duncan and her sister, her Angela Duncan is her son Angela Duncan in Angela Duncan, Angela Duncan. Angela Duncan has advance COPD, s/p critical illness with recent extubation.  I met with her to discuss goals of care and possible hospice options on discharge. She is a DNR-she is open to hospice services at d/c and has lots of family support at home.  Ordered Saline for her dry nose and she is complaining of pain behind her ears from O2 tubing.  We will follow through her hospital course for needs.  Lane Hacker, DO Palliative Medicine  Time: 30 min Greater than 50%  of this time was spent counseling and coordinating care related to the above assessment and plan.

## 2017-12-03 NOTE — Progress Notes (Signed)
Palliative Medicine consult noted. Due to high referral volume, there may be a delay seeing this patient. Please call the Palliative Medicine Team office at [phone] if recommendations are needed in the interim.  Thank you for inviting [place] to see this patient.  Marjie Skiff Dilon Lank, RN, BSN, [facility] Palliative Medicine Team 12/03/2017 9:38 AM Office [phone]

## 2017-12-03 NOTE — Progress Notes (Signed)
Date: December 03, 2017 Velva Harman, BSN, [place], [place] Chart and notes review for patient progress and needs./Extubated and on nasal o2/rsv positive. Will follow for case management and discharge needs. No cm or discharge needs present at time of this review. Next review date: [id number]

## 2017-12-03 NOTE — Progress Notes (Signed)
BUE venous duplex prelim: negative for DVT and SVT. Landry Mellow, RDMS, RVT

## 2017-12-03 NOTE — Progress Notes (Signed)
PROGRESS NOTE  SHARNELLE CAPPELLI FGH:[id number] DOB: 10/15/1951 DOA: 11/24/2017 PCP: Patient, No Pcp Per   LOS: 9 days   Brief Narrative / Interim history: 67 year old female with history of asthma, end-stage COPD, former smoker, hypertension, hydrocephalus status post VP shunt in 2010, anxiety, was admitted to the hospital on 1/30 with acute hypoxic and hypercarbic respiratory failure in the setting of COPD exacerbation.  Patient was initially admitted to the hospitalist service, however shortly after admission she decompensated, her breathing got progressively worse requiring transfer to ICU and she was placed on ventilatory support on 2/1.  She was extubated on 2/5.  Her respiratory virus panel was positive only for RSV.  She was transferred back to the hospitalist service on 2/7.  Assessment & Plan: Principal Problem:   Acute respiratory failure (HCC) Active Problems:   Hyperlipidemia LDL goal <100   Essential hypertension   COPD with asthma (HCC)   Altered mental status   Pressure injury of skin   COPD exacerbation (HCC)   Encounter for central line placement   Shock circulatory (HCC)   COPD exacerbation with acute on chronic hypercarbic and hypoxic respiratory failure/ventilatory dependent respiratory failure/acute RSV infection -Successfully extubated on 2/5, transfer to the hospitalist service on 2/7, slowly improving -Wean oxygen as tolerated, PT consult -PCCM discussed with family, recommending hospice/palliative care consultation.  -For now with Pulmicort, duo nebs, steroids -Palliative care consulted, appreciate input -PT recommending SNF, consult social worker  Acute encephalopathy/severe anxiety -In the setting of #1, mental status now appears back to baseline -Continue Seroquel -Required Precedex, this was discontinued 2/6  Hypertension -She is on Norvasc, Lasix, lisinopril, metoprolol at home, resumed metoprolol and Lasix on 2/7, added hydralazine as needed -Blood  pressure within normal limits this morning at 142/50, continue current regimen  Chronic diastolic CHF -Most recent 2D echo as below during this hospitalization, ejection fraction 67-89%, grade 1 diastolic dysfunction -Closely monitor fluid status -Resume Lasix on 2/7, continue, she appears euvolemic today  Stage II sacral pressure ulcer -Appreciate wound care evaluation, continue local care  History of hydrocephalus  -status post VP shunt  History of hyponatremia -Thought to be due to HCTZ, now discontinued, sodium is normal   DVT prophylaxis: Lovenox Code Status: DNR Family Communication: Discussed with patient's sister at bedside Disposition Plan: TBD, remain in SDU  Consultants:   PCCM  Procedures:   2D echo:  Impressions: - LVEF 50-55%, normal wall thickness, grade 1 DD, high LV filling pressure, normal LA size, mild TR, RVSP 30 mmHg, normal IVC, small pericardial effusion without tamponade features  Antimicrobials:  Azithromycin 1/31 -finished a course  Subjective: -Feels better, she appears comfortable, no chest pain, shortness of breath.  No abdominal pain, nausea or vomiting.  Objective: Vitals:   12/03/17 1000 12/03/17 1100 12/03/17 1200 12/03/17 1230  BP:    (!) 142/50  Pulse: 100 82 66 66  Resp: 16   16  Temp: 98.4 F (36.9 C) 99 F (37.2 C) 99 F (37.2 C)   TempSrc:    Bladder  SpO2: 96% 97% 99% 99%  Weight:      Height:        Intake/Output Summary (Last 24 hours) at 12/03/2017 1337 Last data filed at 12/03/2017 0800 Gross per 24 hour  Intake 815 ml  Output 975 ml  Net -160 ml   Filed Weights   12/01/17 0500 12/02/17 0456 12/03/17 0500  Weight: 64.3 kg (141 lb 12.1 oz) 64.9 kg (143 lb 1.3 oz) 63.5  kg (139 lb 15.9 oz)    Examination:  Constitutional: No distress Eyes: No scleral icterus ENMT: moist mucous membranes Respiratory: Overall diminished breath sounds, no wheezing.  Normal respiratory effort. Cardiovascular: Regular rate and  rhythm without murmurs.  No edema Abdomen: Soft, nontender, nondistended, bowel sounds positive Skin: No rashes Neurologic: Nonfocal   Data Reviewed: I have independently reviewed following labs and imaging studies    CBC: Recent Labs  Lab 2017-12-16 0435 12/16/17 2049 11/29/17 0500  WBC 15.7* 13.8* 9.0  HGB 10.5* 9.5* 8.9*  HCT 31.8* 28.9* 26.4*  MCV 105.0* 105.1* 105.2*  PLT 397 385 267   Basic Metabolic Panel: Recent Labs  Lab 16-Dec-2017 0435 12-16-17 0626 2017/12/16 1709 11/28/17 0409 11/29/17 0500 12/01/17 0547 12/02/17 0500  NA 137 137  --   --  140 139 139  K >7.5* 4.5  --   --  3.9 4.3 4.0  CL 96* 98*  --   --  105 102 98*  CO2 33* 33*  --   --  31 33* 35*  GLUCOSE 165* 180*  --   --  173* 112* 122*  BUN 33* 34*  --   --  29* 26* 22*  CREATININE 0.86 0.84  --   --  0.51 0.36* 0.34*  CALCIUM 5.5* 8.5*  --   --  8.2* 8.3* 8.5*  MG 1.4*  --  2.0 2.3 1.9 2.1  --   PHOS 2.7  --  1.8* 2.3* 2.1* 2.9  --    GFR: Estimated Creatinine Clearance: 60.6 mL/min (A) (by C-G formula based on SCr of 0.34 mg/dL (L)). Liver Function Tests: No results for input(s): AST, ALT, ALKPHOS, BILITOT, PROT, ALBUMIN in the last 168 hours. No results for input(s): LIPASE, AMYLASE in the last 168 hours. No results for input(s): AMMONIA in the last 168 hours. Coagulation Profile: No results for input(s): INR, PROTIME in the last 168 hours. Cardiac Enzymes: No results for input(s): CKTOTAL, CKMB, CKMBINDEX, TROPONINI in the last 168 hours. BNP (last 3 results) No results for input(s): PROBNP in the last 8760 hours. HbA1C: No results for input(s): HGBA1C in the last 72 hours. CBG: Recent Labs  Lab 12/01/17 0306 12/01/17 0819 12/01/17 1309 12/01/17 1526 12/02/17 0737  GLUCAP 109* 119* 162* 142* 122*   Lipid Profile: No results for input(s): CHOL, HDL, LDLCALC, TRIG, CHOLHDL, LDLDIRECT in the last 72 hours. Thyroid Function Tests: No results for input(s): TSH, T4TOTAL, FREET4,  T3FREE, THYROIDAB in the last 72 hours. Anemia Panel: No results for input(s): VITAMINB12, FOLATE, FERRITIN, TIBC, IRON, RETICCTPCT in the last 72 hours. Urine analysis:    Component Value Date/Time   COLORURINE YELLOW 11/10/2017 0438   APPEARANCEUR CLEAR 11/10/2017 0438   LABSPEC 1.011 11/10/2017 0438   PHURINE 6.0 11/10/2017 0438   GLUCOSEU 50 (A) 11/10/2017 0438   HGBUR SMALL (A) 11/10/2017 0438   BILIRUBINUR NEGATIVE 11/10/2017 0438   BILIRUBINUR neg 07/28/2013 0939   KETONESUR 5 (A) 11/10/2017 0438   PROTEINUR NEGATIVE 11/10/2017 0438   UROBILINOGEN 0.2 07/28/2013 0939   UROBILINOGEN 0.2 02/20/2011 1118   NITRITE NEGATIVE 11/10/2017 0438   LEUKOCYTESUR NEGATIVE 11/10/2017 0438   Sepsis Labs: Invalid input(s): PROCALCITONIN, LACTICIDVEN  Recent Results (from the past 240 hour(s))  Culture, blood (routine x 2)     Status: None   Collection Time: 11/24/17  5:17 PM  Result Value Ref Range Status   Specimen Description   Final    BLOOD RIGHT ANTECUBITAL Performed at [facility]  [place] [address] [place], [place] [address]    Special Requests   Final    BOTTLES DRAWN AEROBIC ONLY Blood Culture adequate volume Performed at [place] [address], [place], [place] [address]    Culture   Final    NO GROWTH 5 DAYS Performed at [facility], [place] [address], [place], [place] [address]    Report Status 11/29/2017 FINAL  Final  Culture, blood (routine x 2)     Status: None   Collection Time: 11/24/17  5:43 PM  Result Value Ref Range Status   Specimen Description   Final    BLOOD LEFT ANTECUBITAL Performed at [facility] [address], [place], [place] [address]    Special Requests   Final    BOTTLES DRAWN AEROBIC AND ANAEROBIC Blood Culture adequate volume Performed at [place] [address], [place], [place] [address]    Culture   Final    NO GROWTH 5 DAYS Performed at [facility], [place] [address], [place], [place] [address]    Report Status 11/29/2017 FINAL  Final  Respiratory Panel by PCR     Status: Abnormal   Collection Time: 11/24/17 10:43 PM  Result Value Ref Range Status   Adenovirus NOT DETECTED NOT DETECTED Final   Coronavirus 229E NOT DETECTED NOT DETECTED Final   Coronavirus HKU1 NOT DETECTED NOT DETECTED Final   Coronavirus NL63 NOT DETECTED NOT DETECTED Final   Coronavirus OC43 NOT DETECTED NOT DETECTED Final   Metapneumovirus NOT DETECTED NOT DETECTED Final   Rhinovirus / Enterovirus NOT DETECTED NOT DETECTED Final   Influenza A NOT DETECTED NOT DETECTED Final   Influenza B NOT DETECTED NOT DETECTED Final   Parainfluenza Virus 1 NOT DETECTED NOT DETECTED Final   Parainfluenza Virus 2 NOT DETECTED NOT DETECTED Final   Parainfluenza Virus 3 NOT DETECTED NOT DETECTED Final   Parainfluenza Virus 4 NOT DETECTED NOT DETECTED Final   Respiratory Syncytial Virus DETECTED (A) NOT DETECTED Final    Comment: CRITICAL RESULT CALLED TO, READ BACK BY AND VERIFIED WITH: Sharyn Creamer RN 13:25 11/25/17 ([email or web address])    Bordetella pertussis NOT DETECTED NOT DETECTED Final   Chlamydophila pneumoniae NOT DETECTED NOT DETECTED Final   Mycoplasma pneumoniae NOT DETECTED NOT DETECTED Final    Comment: Performed at [facility], [place] [address], [place],  [address]  MRSA PCR Screening     Status: None   Collection Time: 11/24/17 10:43 PM  Result Value Ref Range Status   MRSA by PCR NEGATIVE NEGATIVE Final    Comment:        The GeneXpert MRSA Assay (FDA approved for NASAL specimens only), is one component of a comprehensive MRSA colonization surveillance program. It is not intended to diagnose MRSA infection nor to guide or monitor treatment for MRSA infections.       Radiology Studies: No results found.   Scheduled Meds: . budesonide (PULMICORT) nebulizer solution  0.5 mg Nebulization BID  . chlorhexidine  15 mL Mouth Rinse BID  .  clonazepam  0.5 mg Oral BID  . enoxaparin (LOVENOX) injection  40 mg Subcutaneous QHS  . furosemide  20 mg Oral Daily  . ipratropium-albuterol  3 mL Nebulization Q4H  . mouth rinse  15 mL Mouth Rinse q12n4p  . methylPREDNISolone (SOLU-MEDROL) injection  40 mg Intravenous Q12H  . metoprolol tartrate  25 mg Oral BID  . pantoprazole  40 mg Oral Daily  . protein supplement shake  11 oz Oral Q24H  . QUEtiapine  25 mg Oral q morning - 10a  . QUEtiapine  50 mg Oral QHS  . senna-docusate  2 tablet Oral BID   Continuous Infusions:   Marzetta Board, MD, PhD [facility] Pager [phone]  If 7PM-7AM, please contact night-coverage [email or web address] Password [email or web address] 12/03/2017, 1:37 PM

## 2017-12-04 LAB — BASIC METABOLIC PANEL
ANION GAP: 3 — AB (ref 5–15)
BUN: 20 mg/dL (ref 6–20)
CALCIUM: 8.1 mg/dL — AB (ref 8.9–10.3)
CO2: 38 mmol/L — ABNORMAL HIGH (ref 22–32)
Chloride: 97 mmol/L — ABNORMAL LOW (ref 101–111)
Creatinine, Ser: 0.33 mg/dL — ABNORMAL LOW (ref 0.44–1.00)
Glucose, Bld: 122 mg/dL — ABNORMAL HIGH (ref 65–99)
POTASSIUM: 3.8 mmol/L (ref 3.5–5.1)
SODIUM: 138 mmol/L (ref 135–145)

## 2017-12-04 MED ORDER — IPRATROPIUM-ALBUTEROL 0.5-2.5 (3) MG/3ML IN SOLN
3.0000 mL | Freq: Two times a day (BID) | RESPIRATORY_TRACT | Status: DC
  Administered 2017-12-05 – 2017-12-06 (×3): 3 mL via RESPIRATORY_TRACT
  Filled 2017-12-04 (×4): qty 3

## 2017-12-04 MED ORDER — METHYLPREDNISOLONE SODIUM SUCC 40 MG IJ SOLR: 40.0000 mg | Freq: Every day | INTRAMUSCULAR | Status: DC

## 2017-12-04 NOTE — Progress Notes (Signed)
Per MD, Central line should stay in place one more day at least, PIV unreliable due to upper extremity swelling. 4E RN notified.

## 2017-12-04 NOTE — Progress Notes (Signed)
PROGRESS NOTE  Angela Duncan XAJ:[id number] DOB: 04/03/1951 DOA: 11/24/2017 PCP: Patient, No Pcp Per   LOS: 10 days   Brief Narrative / Interim history: 67 year old female with history of asthma, end-stage COPD, former smoker, hypertension, hydrocephalus status post VP shunt in 2010, anxiety, was admitted to the hospital on 1/30 with acute hypoxic and hypercarbic respiratory failure in the setting of COPD exacerbation.  Patient was initially admitted to the hospitalist service, however shortly after admission she decompensated, her breathing got progressively worse requiring transfer to ICU and she was placed on ventilatory support on 2/1.  She was extubated on 2/5.  Her respiratory virus panel was positive only for RSV.  She was transferred back to the hospitalist service on 2/7.  She continues to improve, and will be transferred to the floor on 2/9.  Palliative care consulted, there are ongoing goals of care discussions.  Assessment & Plan: Principal Problem:   Acute respiratory failure (HCC) Active Problems:   Hyperlipidemia LDL goal <100   Essential hypertension   COPD with asthma (HCC)   Altered mental status   Pressure injury of skin   COPD exacerbation (HCC)   Encounter for central line placement   Shock circulatory (HCC)   COPD exacerbation with acute on chronic hypercarbic and hypoxic respiratory failure/ventilatory dependent respiratory failure/acute RSV infection -Successfully extubated on 2/5, transfer to the hospitalist service on 2/7, improving, now stable in stepdown, transferred to telemetry on 2/9 -Continue to wean oxygen as tolerated, PT consulted and recommending SNF -Palliative care consulted appreciate input -For now with Pulmicort, duo nebs, steroids, taper down steroids today  Acute encephalopathy/severe anxiety -Continue Seroquel -Required Precedex, this was discontinued 2/6 -Mental status back to baseline, she is alert and oriented x4, confirmed by  patient's sister  yesterday 2/8  Hypertension -She is on Norvasc, Lasix, lisinopril, metoprolol at home, resumed metoprolol and Lasix on 2/7, added hydralazine as needed -Blood pressure normal 124/51 this morning, continue current regimen  Bilateral upper extremity swelling -Ultrasound negative for DVT, continue Lasix.  Given swelling, will keep central line for an additional 24 hours for IV access, if swelling comes down and has good peripherals tomorrow may be able to DC the central line  Chronic diastolic CHF -Most recent 2D echo as below during this hospitalization, ejection fraction 09-47%, grade 1 diastolic dysfunction -Resumed Lasix on 2/7, continue, she appears euvolemic today, she is net +1.1 L, continue to monitor  Stage II sacral pressure ulcer -Appreciate wound care evaluation, continue local care   History of hydrocephalus  -status post VP shunt  History of hyponatremia -Thought to be due to HCTZ, now discontinued, sodium is normal   DVT prophylaxis: Lovenox Code Status: DNR Family Communication: Discussed with patient's sister at bedside on 2/8 Disposition Plan: TBD, remain in SDU  Consultants:   PCCM  Procedures:   2D echo:  Impressions: - LVEF 50-55%, normal wall thickness, grade 1 DD, high LV filling pressure, normal LA size, mild TR, RVSP 30 mmHg, normal IVC, small pericardial effusion without tamponade features  Antimicrobials:  Azithromycin 1/31 -finished a course  Subjective: -Appears comfortable, denies any shortness of breath or chest pain, denies any abdominal pain nausea or vomiting.  She is able to eat well  Objective: Vitals:   12/04/17 0700 12/04/17 0732 12/04/17 0800 12/04/17 1000  BP: (!) 140/54  (!) 170/61 (!) 124/51  Pulse: 74  92   Resp: (!) 21  (!) 22 20  Temp: (!) 97.5 F (36.4 C)  97.7  F (36.5 C) 98.2 F (36.8 C)  TempSrc:      SpO2: 96% 96% 96%   Weight:      Height:        Intake/Output Summary (Last 24 hours) at  12/04/2017 1046 Last data filed at 12/04/2017 0500 Gross per 24 hour  Intake 120 ml  Output 800 ml  Net -680 ml   Filed Weights   12/02/17 0456 12/03/17 0500 12/04/17 0500  Weight: 64.9 kg (143 lb 1.3 oz) 63.5 kg (139 lb 15.9 oz) 65.1 kg (143 lb 8.3 oz)    Examination:  Constitutional: No distress, sitting upright in bed, cachectic appearing female Eyes: Lids and conjunctivae normal, no scleral icterus Neck: IJ line in place, clean dressing ENMT: Moist mucous membranes Respiratory: Decreased breath sounds bilaterally without wheezing or crackles.  Normal respiratory effort. Cardiovascular: Regular rate and rhythm, no murmurs heard.  Bilateral upper extremity edema Abdomen: Soft, nontender, nondistended.  Positive bowel sounds. Skin: No rashes Neurologic: Equal strength, follows commands well   Data Reviewed: I have independently reviewed following labs and imaging studies    CBC: Recent Labs  Lab 12/19/17 2049 11/29/17 0500  WBC 13.8* 9.0  HGB 9.5* 8.9*  HCT 28.9* 26.4*  MCV 105.1* 105.2*  PLT 385 825   Basic Metabolic Panel: Recent Labs  Lab 12-19-17 1709 11/28/17 0409 11/29/17 0500 12/01/17 0547 12/02/17 0500 12/04/17 0500  NA  --   --  140 139 139 138  K  --   --  3.9 4.3 4.0 3.8  CL  --   --  105 102 98* 97*  CO2  --   --  31 33* 35* 38*  GLUCOSE  --   --  173* 112* 122* 122*  BUN  --   --  29* 26* 22* 20  CREATININE  --   --  0.51 0.36* 0.34* 0.33*  CALCIUM  --   --  8.2* 8.3* 8.5* 8.1*  MG 2.0 2.3 1.9 2.1  --   --   PHOS 1.8* 2.3* 2.1* 2.9  --   --    GFR: Estimated Creatinine Clearance: 61.3 mL/min (A) (by C-G formula based on SCr of 0.33 mg/dL (L)). Liver Function Tests: No results for input(s): AST, ALT, ALKPHOS, BILITOT, PROT, ALBUMIN in the last 168 hours. No results for input(s): LIPASE, AMYLASE in the last 168 hours. No results for input(s): AMMONIA in the last 168 hours. Coagulation Profile: No results for input(s): INR, PROTIME in the last  168 hours. Cardiac Enzymes: No results for input(s): CKTOTAL, CKMB, CKMBINDEX, TROPONINI in the last 168 hours. BNP (last 3 results) No results for input(s): PROBNP in the last 8760 hours. HbA1C: No results for input(s): HGBA1C in the last 72 hours. CBG: Recent Labs  Lab 12/01/17 0306 12/01/17 0819 12/01/17 1309 12/01/17 1526 12/02/17 0737  GLUCAP 109* 119* 162* 142* 122*   Lipid Profile: No results for input(s): CHOL, HDL, LDLCALC, TRIG, CHOLHDL, LDLDIRECT in the last 72 hours. Thyroid Function Tests: No results for input(s): TSH, T4TOTAL, FREET4, T3FREE, THYROIDAB in the last 72 hours. Anemia Panel: No results for input(s): VITAMINB12, FOLATE, FERRITIN, TIBC, IRON, RETICCTPCT in the last 72 hours. Urine analysis:    Component Value Date/Time   COLORURINE YELLOW 11/10/2017 [place] 11/10/2017 0438   LABSPEC 1.011 11/10/2017 0438   PHURINE 6.0 11/10/2017 0438   GLUCOSEU 50 (A) 11/10/2017 0438   HGBUR SMALL (A) 11/10/2017 [place] NEGATIVE 11/10/2017 0539  BILIRUBINUR neg 07/28/2013 0939   KETONESUR 5 (A) 11/10/2017 0438   PROTEINUR NEGATIVE 11/10/2017 0438   UROBILINOGEN 0.2 07/28/2013 0939   UROBILINOGEN 0.2 02/20/2011 1118   NITRITE NEGATIVE 11/10/2017 0438   LEUKOCYTESUR NEGATIVE 11/10/2017 0438   Sepsis Labs: Invalid input(s): PROCALCITONIN, LACTICIDVEN  Recent Results (from the past 240 hour(s))  Culture, blood (routine x 2)     Status: None   Collection Time: 11/24/17  5:17 PM  Result Value Ref Range Status   Specimen Description   Final    BLOOD RIGHT ANTECUBITAL Performed at [facility] [address], [place], [place] [address]    Special Requests   Final    BOTTLES DRAWN AEROBIC ONLY Blood Culture adequate volume Performed at [facility] [address] [place], [place] [address]    Culture   Final    NO GROWTH 5 DAYS Performed at [facility], [place] [address]  [place], [place] [address]    Report Status 11/29/2017 FINAL  Final  Culture, blood (routine x 2)     Status: None   Collection Time: 11/24/17  5:43 PM  Result Value Ref Range Status   Specimen Description   Final    BLOOD LEFT ANTECUBITAL Performed at [facility] [address] [place], [place] [address]    Special Requests   Final    BOTTLES DRAWN AEROBIC AND ANAEROBIC Blood Culture adequate volume Performed at [facility] [address], [place], [place] [address]    Culture   Final    NO GROWTH 5 DAYS Performed at [facility], [place] [address], [place], [place] [address]    Report Status 11/29/2017 FINAL  Final  Respiratory Panel by PCR     Status: Abnormal   Collection Time: 11/24/17 10:43 PM  Result Value Ref Range Status   Adenovirus NOT DETECTED NOT DETECTED Final   Coronavirus 229E NOT DETECTED NOT DETECTED Final   Coronavirus HKU1 NOT DETECTED NOT DETECTED Final   Coronavirus NL63 NOT DETECTED NOT DETECTED Final   Coronavirus OC43 NOT DETECTED NOT DETECTED Final   Metapneumovirus NOT DETECTED NOT DETECTED Final   Rhinovirus / Enterovirus NOT DETECTED NOT DETECTED Final   Influenza A NOT DETECTED NOT DETECTED Final   Influenza B NOT DETECTED NOT DETECTED Final   Parainfluenza Virus 1 NOT DETECTED NOT DETECTED Final   Parainfluenza Virus 2 NOT DETECTED NOT DETECTED Final   Parainfluenza Virus 3 NOT DETECTED NOT DETECTED Final   Parainfluenza Virus 4 NOT DETECTED NOT DETECTED Final   Respiratory Syncytial Virus DETECTED (A) NOT DETECTED Final    Comment: CRITICAL RESULT CALLED TO, READ BACK BY AND VERIFIED WITH: Sharyn Creamer RN 13:25 11/25/17 ([email or web address])    Bordetella pertussis NOT DETECTED NOT DETECTED Final   Chlamydophila pneumoniae NOT DETECTED NOT DETECTED Final   Mycoplasma pneumoniae NOT DETECTED NOT DETECTED Final    Comment: Performed at [facility], [place] [address] [place], [place] [address]  MRSA PCR Screening      Status: None   Collection Time: 11/24/17 10:43 PM  Result Value Ref Range Status   MRSA by PCR NEGATIVE NEGATIVE Final    Comment:        The GeneXpert MRSA Assay (FDA approved for NASAL specimens only), is one component of a comprehensive MRSA colonization surveillance program. It is not intended to diagnose MRSA infection nor to guide or monitor treatment for MRSA infections.  Radiology Studies: Vas [place] Upper Extremity Venous Duplex  Result Date: 12/03/2017 UPPER VENOUS STUDY  Indications: Swelling Examination Guidelines: A complete evaluation includes B-mode imaging, spectral doppler, color doppler, and power doppler as needed of all accessible portions of each vessel. Bilateral testing is considered an integral part of a complete examination. Limited examinations for reoccurring indications may be performed as noted. Findings: +----------+-------+--------+------------+------------+-------+---------+ RIGHT     PatencyThrombusCompressibleSpontaneous Phasic Augmented +----------+-------+--------+------------+------------+-------+---------+ Subclavianpatent                     Spontaneous.Phasic.          +----------+-------+--------+------------+------------+-------+---------+ Axillary  patent                     Spontaneous.Phasic.          +----------+-------+--------+------------+------------+-------+---------+ Brachial  patent             yes     Spontaneous.Phasic.          +----------+-------+--------+------------+------------+-------+---------+ Radial    patent             yes                                  +----------+-------+--------+------------+------------+-------+---------+ Ulnar     patent             yes                                  +----------+-------+--------+------------+------------+-------+---------+ Cephalic  patent             yes                                   +----------+-------+--------+------------+------------+-------+---------+ Basilic   patent             yes                                  +----------+-------+--------+------------+------------+-------+---------+ Unable to image right IJV due to line in neck. +----------+-------+--------+------------+------------+-------+---------+ LEFT      PatencyThrombusCompressibleSpontaneous Phasic Augmented +----------+-------+--------+------------+------------+-------+---------+ IJV       patent             yes     Spontaneous.Phasic.          +----------+-------+--------+------------+------------+-------+---------+ Subclavianpatent                     Spontaneous.Phasic.          +----------+-------+--------+------------+------------+-------+---------+ Axillary  patent                     Spontaneous.Phasic.          +----------+-------+--------+------------+------------+-------+---------+ Brachial  patent             yes     Spontaneous.Phasic.          +----------+-------+--------+------------+------------+-------+---------+ Radial    patent             yes                                  +----------+-------+--------+------------+------------+-------+---------+ Ulnar     patent  yes                                  +----------+-------+--------+------------+------------+-------+---------+ Cephalic  patent             yes                                  +----------+-------+--------+------------+------------+-------+---------+ Basilic   patent             yes                                  +----------+-------+--------+------------+------------+-------+---------+  Final Interpretation: No evidence of deep vein or superficial vein thrombosis involving the right and left upper extremities.  *See table(s) above for measurements and observations.  Servando Snare Electronically signed by Servando Snare on 12/03/2017 at 2:26:21 PM.   Final      Scheduled Meds: . budesonide (PULMICORT) nebulizer solution  0.5 mg Nebulization BID  . chlorhexidine  15 mL Mouth Rinse BID  . clonazepam  0.5 mg Oral BID  . enoxaparin (LOVENOX) injection  40 mg Subcutaneous QHS  . furosemide  20 mg Oral Daily  . ipratropium-albuterol  3 mL Nebulization Q4H  . mouth rinse  15 mL Mouth Rinse q12n4p  . methylPREDNISolone (SOLU-MEDROL) injection  40 mg Intravenous Q12H  . metoprolol tartrate  25 mg Oral BID  . pantoprazole  40 mg Oral Daily  . protein supplement shake  11 oz Oral Q24H  . QUEtiapine  25 mg Oral q morning - 10a  . QUEtiapine  50 mg Oral QHS  . senna-docusate  2 tablet Oral BID   Continuous Infusions:   Marzetta Board, MD, PhD [facility] Pager [phone]  If 7PM-7AM, please contact night-coverage [email or web address] Password [email or web address] 12/04/2017, 10:46 AM

## 2017-12-04 NOTE — Progress Notes (Signed)
Patient received to [facility] from step down, VSS.  Patient denies pain.  Difficult to understand speech however patient appears A&Ox4.

## 2017-12-05 LAB — BASIC METABOLIC PANEL
Anion gap: 5 (ref 5–15)
BUN: 18 mg/dL (ref 6–20)
CHLORIDE: 96 mmol/L — AB (ref 101–111)
CO2: 35 mmol/L — ABNORMAL HIGH (ref 22–32)
CREATININE: 0.4 mg/dL — AB (ref 0.44–1.00)
Calcium: 8.6 mg/dL — ABNORMAL LOW (ref 8.9–10.3)
GFR calc non Af Amer: >60 mL/min (ref >60)
Glucose, Bld: 97 mg/dL (ref 65–99)
POTASSIUM: 4.1 mmol/L (ref 3.5–5.1)
SODIUM: 136 mmol/L (ref 135–145)

## 2017-12-05 LAB — CBC
HCT: 32.8 % — ABNORMAL LOW (ref 36.0–46.0)
HEMOGLOBIN: 11.2 g/dL — AB (ref 12.0–15.0)
MCH: 34.9 pg — ABNORMAL HIGH (ref 26.0–34.0)
MCHC: 34.1 g/dL (ref 30.0–36.0)
MCV: 102.2 fL — ABNORMAL HIGH (ref 78.0–100.0)
Platelets: 464 10*3/uL — ABNORMAL HIGH (ref 150–400)
RBC: 3.21 MIL/uL — AB (ref 3.87–5.11)
RDW: 13.5 % (ref 11.5–15.5)
WBC: 15.1 10*3/uL — ABNORMAL HIGH (ref 4.0–10.5)

## 2017-12-05 MED ORDER — FUROSEMIDE 10 MG/ML IJ SOLN
40.0000 mg | Freq: Once | INTRAMUSCULAR | Status: AC
  Administered 2017-12-05: 40 mg via INTRAVENOUS
  Filled 2017-12-05: qty 4

## 2017-12-05 MED ORDER — FUROSEMIDE 40 MG PO TABS
40.0000 mg | ORAL_TABLET | Freq: Every day | ORAL | Status: DC
  Administered 2017-12-05 – 2017-12-06 (×2): 40 mg via ORAL
  Filled 2017-12-05 (×2): qty 1

## 2017-12-05 MED ORDER — PREDNISONE 20 MG PO TABS
40.0000 mg | ORAL_TABLET | Freq: Every day | ORAL | Status: DC
  Administered 2017-12-05 – 2017-12-06 (×2): 40 mg via ORAL
  Filled 2017-12-05 (×2): qty 2

## 2017-12-05 NOTE — Progress Notes (Signed)
RN notified Carlyle Lipa (sister-in-law) @ [phone] to return phone call from earlier this evening.  Jackelyn Poling stated that she may need another oxygen set-up for the patient upon discharge so there was an order placed for Case Management

## 2017-12-05 NOTE — Plan of Care (Signed)
VSS, patient without complaint this shift   Multiple family members at bedside.

## 2017-12-05 NOTE — Progress Notes (Signed)
[facility]                                                                              Patient Demographics  Angela Duncan, is a 67 y.o. female, DOB - [birth year], NLZ:[id number]  Admit date - 11/24/2017   Admitting Physician Patrecia Pour, MD  Outpatient Primary MD for the patient is Patient, No Pcp Per  Outpatient specialists:   LOS - 11  days   Medical records reviewed and are as summarized below:    Chief Complaint  Patient presents with  . Shortness of Breath       Brief summary  67 year old female with history of asthma, end-stage COPD, former smoker, hypertension, hydrocephalus status post VP shunt in 2010, anxiety, was admitted to the hospital on 1/30 with acute hypoxic and hypercarbic respiratory failure in the setting of COPD exacerbation.  Patient was initially admitted to the hospitalist service, however shortly after admission she decompensated, her breathing got progressively worse requiring transfer to ICU and she was placed on ventilatory support on 2/1.  She was extubated on 2/5.  Her respiratory virus panel was positive only for RSV.  She was transferred back to the hospitalist service on 2/7.    Assessment & Plan    Principal Problem: Acute on chronic hypercarbic, hypoxic respiratory failure secondary to COPD exacerbation, VDRF, acute RSV infection  -Patient required mechanical intubation due to respiratory failure shortly after admission, extubated on 2/5  -Slowly improving, transition to oral prednisone, continue scheduled nebs, Pulmicort, -Transition to oral prednisone  -Palliative care consulted. -PT patient recommended skilled nursing facility, social work consulted  Acute encephalopathy/severe anxiety -Patient required Precedex in ICU which was discontinued 2/6  -Continue Seroquel, currently alert and oriented but very debilitated, frail   Hypertension -She is on Norvasc, Lasix, lisinopril, metoprolol at home -Continue  metoprolol, Lasix, hydralazine as needed -Lasix increased to 40 mg daily, significant improvement after one IV dose of 40 mg Lasix on 2/10 a.m.  Bilateral upper extremity swelling -Ultrasound negative for DVT, continue Lasix, increase to 40 mg daily  Chronic diastolic CHF -2D echo with EF of 90-24%, grade 1 diastolic dysfunction -Continue Lasix, metoprolol, strict I's and O's and daily weights  Stage II sacral pressure ulcer -Appreciate wound care evaluation, continue local care   History of hydrocephalus  -status post VP shunt  History of hyponatremia -Thought to be due to HCTZ, now discontinued, sodium is normal  Code Status: DNR DVT Prophylaxis:  Lovenox  Family Communication: Discussed in detail with the patient, all imaging results, lab results explained to the patient   Disposition Plan: Await skilled nursing facility placement, SW consulted  Time Spent in minutes 25 minutes  Procedures:  2D echo Left ventricle: The cavity size was normal. Wall thickness was   normal. Systolic function was normal. The estimated ejection   fraction was in the range of 50% to 55%. Images were inadequate   for LV wall motion assessment. Doppler parameters are consistent   with abnormal left ventricular relaxation (grade 1 diastolic   dysfunction).   Consultants:   None  Antimicrobials:      Medications  Scheduled Meds: . budesonide (PULMICORT) nebulizer solution  0.5 mg Nebulization BID  . chlorhexidine  15 mL Mouth Rinse BID  . clonazepam  0.5 mg Oral BID  . enoxaparin (LOVENOX) injection  40 mg Subcutaneous QHS  . furosemide  40 mg Oral Daily  . ipratropium-albuterol  3 mL Nebulization BID  . mouth rinse  15 mL Mouth Rinse q12n4p  . metoprolol tartrate  25 mg Oral BID  . pantoprazole  40 mg Oral Daily  . predniSONE  40 mg Oral Q breakfast  . protein supplement shake  11 oz Oral Q24H  . QUEtiapine  25 mg Oral q morning - 10a  . QUEtiapine  50 mg Oral QHS  .  senna-docusate  2 tablet Oral BID   Continuous Infusions: PRN Meds:.albuterol, hydrALAZINE, sodium chloride, traMADol   Antibiotics   Anti-infectives (From admission, onward)   Start     Dose/Rate Route Frequency Ordered Stop   11/27/17 1100  azithromycin (ZITHROMAX) 200 MG/5ML suspension 250 mg     250 mg Per Tube  Once 11/27/17 0958 11/27/17 1106   11/24/17 2215  azithromycin (ZITHROMAX) 500 mg in dextrose 5 % 250 mL IVPB  Status:  Discontinued     500 mg 250 mL/hr over 60 Minutes Intravenous Daily at bedtime 11/24/17 2200 11/27/17 8315        Subjective:   Angela Duncan was seen and examined today.  Very frail, debilitated, alert and awake, denies any specific complaints. Patient denies dizziness, chest pain, shortness of breath, abdominal pain, N/V/D/C. No acute events overnight.    Objective:   Vitals:   12/04/17 2204 12/05/17 0420 12/05/17 0820 12/05/17 1055  BP: (!) 145/62 135/68  130/60  Pulse: 90 91  95  Resp:  20    Temp:  97.7 F (36.5 C)    TempSrc:  Oral    SpO2:  93% (!) 86% 95%  Weight:  63.9 kg (140 lb 14 oz)    Height:        Intake/Output Summary (Last 24 hours) at 12/05/2017 1146 Last data filed at 12/05/2017 1109 Gross per 24 hour  Intake 600 ml  Output 1470 ml  Net -870 ml     Wt Readings from Last 3 Encounters:  12/05/17 63.9 kg (140 lb 14 oz)  11/15/17 67.6 kg (149 lb 0.5 oz)  01/14/15 62.6 kg (138 lb)     Exam  General: Alert and oriented x 3, NAD, cachectic, frail-appearing  Eyes:   HEENT:  Atraumatic, normocephalic, IJ line in place  Cardiovascular: S1-S2 clear  Respiratory: Decreased breath sounds at the bases bilaterally  Gastrointestinal: Soft, nontender, nondistended, + bowel sounds  Ext: bilateral upper extremity edema  Neuro: no new deficits  Musculoskeletal: No digital cyanosis, clubbing  Skin: No rashes  Psych: Normal affect and demeanor, alert and oriented x3    Data Reviewed:  I have personally reviewed  following labs and imaging studies  Micro Results No results found for this or any previous visit (from the past 240 hour(s)).  Radiology Reports Dg Chest 1 View  Result Date: 11/15/2017 CLINICAL DATA:  Hypoxia EXAM: CHEST 1 VIEW COMPARISON:  11/11/2017 FINDINGS: Cardiac shadow is stable. Aortic calcifications are again seen. Left-sided shunt catheter is again identified and stable. The lungs are well aerated bilaterally. No acute bony abnormality is seen. IMPRESSION: No acute abnormality noted. Electronically Signed   By: Inez Catalina M.D.   On: 11/15/2017 08:02   Dg Chest 2 View  Result Date:  11/10/2017 CLINICAL DATA:  67 year old female with altered mental status. EXAM: CHEST  2 VIEW COMPARISON:  None. FINDINGS: Left IJ central venous line with tip over the spine just below the diaphragm. There is mild emphysematous changes of the lungs. No focal consolidation, pleural effusion, or pneumothorax. The cardiac silhouette is within normal limits. No acute osseous pathology. IMPRESSION: No active cardiopulmonary disease. Electronically Signed   By: Anner Crete M.D.   On: 11/10/2017 04:33   Dg Abd 1 View  Result Date: 11/26/2017 CLINICAL DATA:  Acute respiratory failure. STAT READ. eval for NG tube placement. Hx of asthma. EXAM: ABDOMEN - 1 VIEW COMPARISON:  11/20/2013 FINDINGS: Nasal/orogastric tube passes well below the diaphragm. Tip projects in the mid stomach. Mild increase in bowel gas and colonic stool. No evidence of bowel obstruction. Ventriculoperitoneal catheter extends from the chest to have its tip projects over the left iliac crest. IMPRESSION: 1. Nasal/orogastric tube is well positioned. Electronically Signed   By: Lajean Manes M.D.   On: 11/26/2017 11:00   Ct Head Wo Contrast  Result Date: 11/10/2017 CLINICAL DATA:  67 y/o F; visual hallucinations, agitation, increasing paranoia, recent diagnosis of delirium. EXAM: CT HEAD WITHOUT CONTRAST TECHNIQUE: Contiguous axial images were  obtained from the base of the skull through the vertex without intravenous contrast. COMPARISON:  12/26/2013 CT head FINDINGS: Brain: No evidence of acute infarction, hemorrhage, hydrocephalus, extra-axial collection or mass lesion/mass effect. Left parietal approach ventriculostomy catheter with tip and right lateral ventricle near foramen of Monro, stable. Stable ventricle size. Stable chronic infarct of corpus callosum. Interval progression of moderate chronic microvascular ischemic changes and mild parenchymal volume loss of the brain. Vascular: No hyperdense vessel or unexpected calcification. Skull: Normal. Negative for fracture or focal lesion. Sinuses/Orbits: Small fluid levels in the maxillary sinuses. Otherwise negative. Other: None. IMPRESSION: 1. No acute intracranial abnormality identified. 2. Progression of moderate chronic microvascular ischemic changes. Stable chronic infarct of corpus callosum. 3. Stable position of left parietal approach ventriculostomy catheter. Stable ventricle size. 4. Fluid levels in maxillary sinus may represent acute sinusitis. Electronically Signed   By: Kristine Garbe M.D.   On: 11/10/2017 04:52   Dg Chest Port 1 View  Result Date: 11/29/2017 CLINICAL DATA:  Acute respiratory failure EXAM: PORTABLE CHEST 1 VIEW COMPARISON:  11/26/2017 FINDINGS: Cardiac shadow is within normal limits. Arctic calcifications are again seen. Right jugular central line, endotracheal tube and nasogastric catheter are noted in satisfactory position. Left-sided shunt catheter is seen. Lungs are well aerated without focal infiltrate or sizable effusion. IMPRESSION: No acute abnormality noted.  No change from the prior study. Electronically Signed   By: Inez Catalina M.D.   On: 11/29/2017 07:05   Dg Chest Port 1 View  Result Date: 11/26/2017 CLINICAL DATA:  Central line placement EXAM: PORTABLE CHEST 1 VIEW COMPARISON:  Earlier today FINDINGS: New right IJ central line with tip at the  SVC. No pneumothorax or new mediastinal widening. There is a VP shunt over the left chest. Endotracheal tube with tip approximately 2 cm above the carina. An orogastric tube reaches the stomach at least. COPD with hyperinflation. Normal heart size and mediastinal contours. There is no edema, consolidation, effusion, or pneumothorax. IMPRESSION: 1. New central line without complicating feature. Remaining hardware in stable position. 2. COPD. Electronically Signed   By: Monte Fantasia M.D.   On: 11/26/2017 13:01   Portable Chest X-ray  Result Date: 11/26/2017 CLINICAL DATA:  Acute respiratory failure. STAT READ. eval for ETT tube  placement. Hx of asthma. EXAM: PORTABLE CHEST 1 VIEW COMPARISON:  11/24/2017 FINDINGS: New endotracheal tube tip projects 3.4 cm above the Carina. Oral/nasogastric tube passes below the diaphragm well into the stomach. Lungs remain hyperexpanded consistent with COPD. No evidence of pneumonia or pulmonary edema. No pleural effusion or pneumothorax. IMPRESSION: 1. Endotracheal tube and nasal/orogastric tube are well positioned. 2. Advanced COPD.  No acute cardiopulmonary disease. Electronically Signed   By: Lajean Manes M.D.   On: 11/26/2017 10:59   Dg Chest Port 1 View  Result Date: 11/24/2017 CLINICAL DATA:  Shortness of breath. EXAM: PORTABLE CHEST 1 VIEW COMPARISON:  11/15/2017 and 11/10/2017 and 11/20/2013 FINDINGS: The lungs are hyperinflated with flattening of the diaphragm consistent with emphysema. No acute infiltrates or effusions. Heart size is normal. Aortic atherosclerosis. Pulmonary vascularity is within normal limits. IMPRESSION: 1. Emphysema. 2. Aortic atherosclerosis. 3. No acute abnormalities. Electronically Signed   By: Lorriane Shire M.D.   On: 11/24/2017 16:08   Dg Chest Port 1 View  Result Date: 11/11/2017 CLINICAL DATA:  Respiratory failure EXAM: PORTABLE CHEST 1 VIEW COMPARISON:  11/10/2017 FINDINGS: Heart size and vascularity normal. Lungs remain clear  without infiltrate or effusion. Shunt tubing overlies the left chest. IMPRESSION: No active disease. Electronically Signed   By: Franchot Gallo M.D.   On: 11/11/2017 09:09   Vas [place] Upper Extremity Venous Duplex  Result Date: 12/03/2017 UPPER VENOUS STUDY  Indications: Swelling Examination Guidelines: A complete evaluation includes B-mode imaging, spectral doppler, color doppler, and power doppler as needed of all accessible portions of each vessel. Bilateral testing is considered an integral part of a complete examination. Limited examinations for reoccurring indications may be performed as noted. Findings: +----------+-------+--------+------------+------------+-------+---------+ RIGHT     PatencyThrombusCompressibleSpontaneous Phasic Augmented +----------+-------+--------+------------+------------+-------+---------+ Subclavianpatent                     Spontaneous.Phasic.          +----------+-------+--------+------------+------------+-------+---------+ Axillary  patent                     Spontaneous.Phasic.          +----------+-------+--------+------------+------------+-------+---------+ Brachial  patent             yes     Spontaneous.Phasic.          +----------+-------+--------+------------+------------+-------+---------+ Radial    patent             yes                                  +----------+-------+--------+------------+------------+-------+---------+ Ulnar     patent             yes                                  +----------+-------+--------+------------+------------+-------+---------+ Cephalic  patent             yes                                  +----------+-------+--------+------------+------------+-------+---------+ Basilic   patent             yes                                  +----------+-------+--------+------------+------------+-------+---------+  Unable to image right IJV due to line in neck.  +----------+-------+--------+------------+------------+-------+---------+ LEFT      PatencyThrombusCompressibleSpontaneous Phasic Augmented +----------+-------+--------+------------+------------+-------+---------+ IJV       patent             yes     Spontaneous.Phasic.          +----------+-------+--------+------------+------------+-------+---------+ Subclavianpatent                     Spontaneous.Phasic.          +----------+-------+--------+------------+------------+-------+---------+ Axillary  patent                     Spontaneous.Phasic.          +----------+-------+--------+------------+------------+-------+---------+ Brachial  patent             yes     Spontaneous.Phasic.          +----------+-------+--------+------------+------------+-------+---------+ Radial    patent             yes                                  +----------+-------+--------+------------+------------+-------+---------+ Ulnar     patent             yes                                  +----------+-------+--------+------------+------------+-------+---------+ Cephalic  patent             yes                                  +----------+-------+--------+------------+------------+-------+---------+ Basilic   patent             yes                                  +----------+-------+--------+------------+------------+-------+---------+  Final Interpretation: No evidence of deep vein or superficial vein thrombosis involving the right and left upper extremities.  *See table(s) above for measurements and observations.  Servando Snare Electronically signed by Servando Snare on 12/03/2017 at 2:26:21 PM.    Lab Data:  CBC: Recent Labs  Lab 11/29/17 0500 12/05/17 0523  WBC 9.0 15.1*  HGB 8.9* 11.2*  HCT 26.4* 32.8*  MCV 105.2* 102.2*  PLT 314 244*   Basic Metabolic Panel: Recent Labs  Lab 11/29/17 0500 12/01/17 0547 12/02/17 0500 12/04/17 0500 12/05/17 0523    NA 140 139 139 138 136  K 3.9 4.3 4.0 3.8 4.1  CL 105 102 98* 97* 96*  CO2 31 33* 35* 38* 35*  GLUCOSE 173* 112* 122* 122* 97  BUN 29* 26* 22* 20 18  CREATININE 0.51 0.36* 0.34* 0.33* 0.40*  CALCIUM 8.2* 8.3* 8.5* 8.1* 8.6*  MG 1.9 2.1  --   --   --   PHOS 2.1* 2.9  --   --   --    GFR: Estimated Creatinine Clearance: 60.7 mL/min (A) (by C-G formula based on SCr of 0.4 mg/dL (L)). Liver Function Tests: No results for input(s): AST, ALT, ALKPHOS, BILITOT, PROT, ALBUMIN in the last 168 hours. No results for input(s): LIPASE, AMYLASE in the last 168 hours. No results for input(s): AMMONIA in the last 168 hours. Coagulation Profile: No  results for input(s): INR, PROTIME in the last 168 hours. Cardiac Enzymes: No results for input(s): CKTOTAL, CKMB, CKMBINDEX, TROPONINI in the last 168 hours. BNP (last 3 results) No results for input(s): PROBNP in the last 8760 hours. HbA1C: No results for input(s): HGBA1C in the last 72 hours. CBG: Recent Labs  Lab 12/01/17 0306 12/01/17 0819 12/01/17 1309 12/01/17 1526 12/02/17 0737  GLUCAP 109* 119* 162* 142* 122*   Lipid Profile: No results for input(s): CHOL, HDL, LDLCALC, TRIG, CHOLHDL, LDLDIRECT in the last 72 hours. Thyroid Function Tests: No results for input(s): TSH, T4TOTAL, FREET4, T3FREE, THYROIDAB in the last 72 hours. Anemia Panel: No results for input(s): VITAMINB12, FOLATE, FERRITIN, TIBC, IRON, RETICCTPCT in the last 72 hours. Urine analysis:    Component Value Date/Time   COLORURINE YELLOW 11/10/2017 [place] 11/10/2017 0438   LABSPEC 1.011 11/10/2017 0438   PHURINE 6.0 11/10/2017 0438   GLUCOSEU 50 (A) 11/10/2017 0438   HGBUR SMALL (A) 11/10/2017 0438   BILIRUBINUR NEGATIVE 11/10/2017 0438   BILIRUBINUR neg 07/28/2013 0939   KETONESUR 5 (A) 11/10/2017 0438   PROTEINUR NEGATIVE 11/10/2017 0438   UROBILINOGEN 0.2 07/28/2013 0939   UROBILINOGEN 0.2 02/20/2011 1118   NITRITE NEGATIVE 11/10/2017  0438   LEUKOCYTESUR NEGATIVE 11/10/2017 0438     Ripudeep Rai M.D. [facility] 12/05/2017, 11:46 AM  Pager: [phone] Between 7am to 7pm - call Pager - [phone]  After 7pm go to [email or web address] - password [email or web address]  Call night coverage person covering after 7pm

## 2017-12-05 NOTE — Progress Notes (Signed)
Patient has only voided 20 ml in the last 7.5 hours. RN bladder scanned the patient which yielded "0".  PCP was notified.

## 2017-12-06 DIAGNOSIS — J449 Chronic obstructive pulmonary disease, unspecified: Secondary | ICD-10-CM

## 2017-12-06 DIAGNOSIS — R400 Somnolence: Secondary | ICD-10-CM

## 2017-12-06 DIAGNOSIS — Z452 Encounter for adjustment and management of vascular access device: Secondary | ICD-10-CM

## 2017-12-06 LAB — MAGNESIUM: MAGNESIUM: 1.6 mg/dL — AB (ref 1.7–2.4)

## 2017-12-06 MED ORDER — MAGNESIUM SULFATE 50 % IJ SOLN
3.0000 g | Freq: Once | INTRAVENOUS | Status: AC
  Administered 2017-12-06: 3 g via INTRAVENOUS
  Filled 2017-12-06: qty 6

## 2017-12-06 MED ORDER — FUROSEMIDE 40 MG PO TABS: 40.0000 mg | ORAL_TABLET | Freq: Every day | ORAL | 4 refills | Status: AC

## 2017-12-06 MED ORDER — CLONAZEPAM 0.5 MG PO TBDP: 0.5000 mg | ORAL_TABLET | Freq: Two times a day (BID) | ORAL | 0 refills | Status: AC | PRN

## 2017-12-06 MED ORDER — TRAMADOL HCL 50 MG PO TABS: 25.0000 mg | ORAL_TABLET | Freq: Two times a day (BID) | ORAL | 0 refills | Status: AC | PRN

## 2017-12-06 MED ORDER — IPRATROPIUM-ALBUTEROL 0.5-2.5 (3) MG/3ML IN SOLN: 3.0000 mL | Freq: Two times a day (BID) | RESPIRATORY_TRACT | 3 refills | Status: AC

## 2017-12-06 MED ORDER — QUETIAPINE FUMARATE 25 MG PO TABS: ORAL_TABLET | ORAL | 0 refills | Status: AC

## 2017-12-06 MED ORDER — FLUTICASONE FUROATE-VILANTEROL 100-25 MCG/INH IN AEPB: 1.0000 | INHALATION_SPRAY | Freq: Every day | RESPIRATORY_TRACT | 5 refills | Status: AC

## 2017-12-06 MED ORDER — UMECLIDINIUM BROMIDE 62.5 MCG/INH IN AEPB: 1.0000 | INHALATION_SPRAY | Freq: Every day | RESPIRATORY_TRACT | 3 refills | Status: AC

## 2017-12-06 MED ORDER — PANTOPRAZOLE SODIUM 40 MG PO TBEC: 40.0000 mg | DELAYED_RELEASE_TABLET | Freq: Every day | ORAL | 0 refills | Status: AC

## 2017-12-06 MED ORDER — PREDNISONE 10 MG PO TABS: ORAL_TABLET | ORAL | 0 refills | Status: DC

## 2017-12-06 MED ORDER — SENNOSIDES-DOCUSATE SODIUM 8.6-50 MG PO TABS: 2.0000 | ORAL_TABLET | Freq: Every day | ORAL | 0 refills | Status: AC

## 2017-12-06 NOTE — Progress Notes (Signed)
LCSW consulted for SNF placement.   According to notes patient to dc home with family.   LCSW signing off. No CSW needs at this time. Please submit new consult if CSW needs arise.   Carolin Coy [place]

## 2017-12-06 NOTE — Progress Notes (Signed)
PHYSICAL THERAPY  SATURATION QUALIFICATIONS: (This note is used to comply with regulatory documentation for home oxygen)  Patient Saturations on Room Air at Rest = 90%  Patient Saturations on Room Air while EOB, Pt unable to Ambulating = 85%  Patient Saturations on 2 Liters of oxygen while activity transfer on/off BSC, Pt unable to ambulate 92%  Please briefly explain why patient needs home oxygen: Pt required supplemetal oxygen to achieve therapeutic level.  Angela Duncan  PTA [facility]  Acute  Rehab Pager      [phone]

## 2017-12-06 NOTE — Progress Notes (Signed)
Nutrition Follow-up  DOCUMENTATION CODES:   Not applicable  INTERVENTION:  - Continue Premier Protein once/day. - Continue to encourage PO intakes   NUTRITION DIAGNOSIS:   Inadequate oral intake related to inability to eat as evidenced by NPO status. -resolved.  No new nutrition dx at this time.    GOAL:   Patient will meet greater than or equal to 90% of their needs -met  MONITOR:   PO intake, Supplement acceptance, Weight trends, Labs, Skin  ASSESSMENT:   67 y.o. female with a history of asthma/COPD, HTN, hydrocephalus s/p VP shunt 2010, anxiety, stage 2 sacral pressure ulcer, and recent admission for hyponatremia thought to be due to HCTZ causing AMS discharged on 1/22 who presented to the ED by EMS for dyspnea associated with nonproductive cough and mild pleuritic chest pain.   Weight has been stable since 2/5. Per doc flow sheet, pt consumed 25% of breakfast (212 kcal, 8 grams of protein) and 50% of dinner (230 kcal, 4 grams of protein) on 2/8 and 100% of dinner (915 kcal, 27 grams of protein) on 2/10. Premier Protein (160 kcal, 30 grams of protein) was ordered by this RD on 2/6 and pt has accepted every time the supplement was offered.  Per Dr. Josem Kaufmann note yesterday morning, pt awaiting SNF bed.   Medications reviewed; 40 mg oral Lasix/day starting 2/10, 40 mg Deltasone/day, 2 tablets Senokot BID.  Labs reviewed from 2/10: Cl: 96 mmol/L, creatinine: 0.4 mg/dL, Ca: 8.6 mg/dL.     Diet Order:  Diet regular Room service appropriate? Yes; Fluid consistency: Thin  EDUCATION NEEDS:   No education needs have been identified at this time  Skin:  Skin Assessment: Skin Integrity Issues: Skin Integrity Issues:: Stage I, Stage II Stage I: L leg Stage II: sacrum  Last BM:  2/10  Height:   Ht Readings from Last 1 Encounters:  11/25/17 5' 2"  (1.575 m)    Weight:   Wt Readings from Last 1 Encounters:  12/06/17 138 lb 0.1 oz (62.6 kg)    Ideal Body Weight:  50  kg  BMI:  Body mass index is 25.24 kg/m.  Estimated Nutritional Needs:   Kcal:  1500-1800  Protein:  66-78 grams  Fluid:  1.2-1.5 L/day      Jarome Matin, MS, RD, LDN, [facility] Inpatient Clinical Dietitian Pager # [phone] After hours/weekend pager # [phone]

## 2017-12-06 NOTE — Care Management Note (Signed)
Case Management Note  Patient Details  Name: Angela Duncan MRN: [id number] Date of Birth: [birth year]  Subjective/Objective: Spoke to family-sister-in-law Debbie about d/c plans-home w/HHC,declines SNF-Already uses [facility] for dme-spoke to [place] confirmed home 02-per family current home 02 not working-[facility] will deliver home 02 set up to house-patients son is already @ home. Orders for hospital bed,w/c,& home 02-will fax to [place] once all info documented. [facility] already active for HHC-ordered for HHPT/HHRN-rep Santiago Glad aware. Patient will transport home by ambulance [facility] once all dme in home-family will contact hospital for patient to transport home. MD updated. [facility] forms in shadow chart.                   Action/Plan:d/c home w/HHC/DME   Expected Discharge Date:  (unknown)               Expected Discharge Plan:  [place]  In-House Referral:     Discharge planning Services  CM Consult  Post Acute Care Choice:  Home Health, Resumption of Svcs/PTA Provider, Durable Medical Equipment(Active w/ [facility] [phone]) Choice offered to:  Patient  DME Arranged:  Air overlay mattress, Wheelchair manual, Hospital bed DME Agency:     HH Arranged:  PT, RN [facility] Agency:  [place]  Status of Service:  Completed, signed off  If discussed at [place] of Stay Meetings, dates discussed:    Additional Comments:  Dessa Phi, RN 12/06/2017, 11:59 AM

## 2017-12-06 NOTE — Progress Notes (Signed)
Attempted to call Jackelyn Poling again, the patients brother Gwyndolyn Saxon answered the phone. Discussed the AVS with him and told him the prescriptions and AVS would be brought to them when [facility] drops his sister off. Gwyndolyn Saxon verbalized understanding. Awaiting [facility] to transport.

## 2017-12-06 NOTE — Progress Notes (Addendum)
    Durable Medical Equipment  (From admission, onward)        Start     Ordered   12/06/17 1034  For home use only DME oxygen  Once    Question Answer Comment  Mode or (Route) Nasal cannula   Liters per Minute 2   Frequency Continuous (stationary and portable oxygen unit needed)   Oxygen conserving device Yes   Oxygen delivery system Gas      12/06/17 1033   12/06/17 1031  For home use only DME Hospital bed  Once    Question Answer Comment  Patient has (list medical condition): severe COPD, ac respiratory failure with hypoxia, was intubated, extubated, generalized debility   The above medical condition requires: Patient requires the ability to reposition frequently   Head must be elevated greater than: 30 degrees   Bed type Semi-electric   Support Surface: Low Air loss Mattress      12/06/17 1032

## 2017-12-06 NOTE — Discharge Summary (Signed)
Physician Discharge Summary   Patient ID: Angela Duncan MRN: [id number] DOB/AGE: 1951/01/24 67 y.o.  Admit date: 11/24/2017 Discharge date: 12/06/2017  Primary Care Physician:  Patient, No Pcp Per  Discharge Diagnoses:    . Acute respiratory failure with hypoxia ([place]) . Acute metabolic encephalopathy/delirium . 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 Essential hypertension  . Hyperlipidemia LDL goal <100 . Pressure injury of skin Ventilator dependent respiratory failure Acute RSV infection Hypertension Bilateral upper extremity swelling Acute on chronic diastolic CHF History of hydrocephalus Stage II sacral pressure ulcer -   Consults:  P CCM Palliative care  Recommendations for Outpatient Follow-up:  1. Home health PT OT, home health aide, RN arranged by case management.  Patient declined skilled nursing facility 2. Please repeat CBC/BMET at next visit 3. Outpatient follow-up with pulmonology, Dr. Chase Caller scheduled 4. DME equipments including hospital bed, wheelchair will be arranged by the case management.   DIET: Heart healthy diet    Allergies:   Allergies  Allergen Reactions  . Hydrochlorothiazide     CAUSES LIFE THREATENING HYPONATREMIA NA 111  . Penicillins     Made her sleep all day Has patient had a PCN reaction causing immediate rash, facial/tongue/throat swelling, SOB or lightheadedness with hypotension YES Has patient had a PCN reaction causing severe rash involving mucus membranes or skin necrosis: No Has patient had a PCN reaction that required hospitalization: No Has patient had a PCN reaction occurring within the last 10 years: No If all of the above answers are "NO", then may proceed with Cephalosporin use.      DISCHARGE MEDICATIONS: Allergies as of 12/06/2017      Reactions   Hydrochlorothiazide    CAUSES LIFE THREATENING HYPONATREMIA NA 111   Penicillins    Made her sleep all day Has patient had a PCN reaction causing immediate  rash, facial/tongue/throat swelling, SOB or lightheadedness with hypotension YES Has patient had a PCN reaction causing severe rash involving mucus membranes or skin necrosis: No Has patient had a PCN reaction that required hospitalization: No Has patient had a PCN reaction occurring within the last 10 years: No If all of the above answers are "NO", then may proceed with Cephalosporin use.      Medication List    STOP taking these medications   amLODipine 10 MG tablet Commonly known as:  NORVASC   lovastatin 40 MG tablet Commonly known as:  MEVACOR     TAKE these medications   ASPIRIN LOW DOSE 81 MG EC tablet Generic drug:  aspirin Take 81 mg by mouth daily.   clonazePAM 0.5 MG disintegrating tablet Commonly known as:  KLONOPIN Take 1 tablet (0.5 mg total) by mouth 2 (two) times daily as needed (anxiety).   fluticasone furoate-vilanterol 100-25 MCG/INH Aepb Commonly known as:  BREO ELLIPTA Inhale 1 puff into the lungs daily.   furosemide 40 MG tablet Commonly known as:  LASIX Take 1 tablet (40 mg total) by mouth daily. What changed:    medication strength  how much to take   ipratropium-albuterol 0.5-2.5 (3) MG/3ML Soln Commonly known as:  DUONEB Take 3 mLs by nebulization 2 (two) times daily.   lisinopril 5 MG tablet Commonly known as:  PRINIVIL,ZESTRIL Take 1 tablet (5 mg total) by mouth daily.   metoprolol tartrate 25 MG tablet Commonly known as:  LOPRESSOR Take 1 tablet (25 mg total) by mouth 2 (two) times daily.   pantoprazole 40 MG tablet Commonly known as:  PROTONIX Take 1 tablet (40  mg total) by mouth daily. Start taking on:  12/07/2017   potassium chloride 10 MEQ tablet Commonly known as:  K-DUR Take 1 tablet (10 mEq total) by mouth daily.   pravastatin 10 MG tablet Commonly known as:  PRAVACHOL TAKE 1 TABLETBY MOUTH DAILY AT 6 PM.   predniSONE 10 MG tablet Commonly known as:  DELTASONE Prednisone dosing: Take  Prednisone 40mg  (4 tabs) x 2  days, then taper to 30mg  (3 tabs) x 3 days, then 20mg  (2 tabs) x 3days, then 10mg  (1 tab) x 3days, then OFF.   QUEtiapine 25 MG tablet Commonly known as:  SEROQUEL Take 1 tablet (25 mg) in the morning, 2 tablets (50 mg) at bedtime   senna-docusate 8.6-50 MG tablet Commonly known as:  Senokot-S Take 2 tablets by mouth at bedtime. For constipation   traMADol 50 MG tablet Commonly known as:  ULTRAM Take 0.5 tablets (25 mg total) by mouth every 12 (twelve) hours as needed for moderate pain.   umeclidinium bromide 62.5 MCG/INH Aepb Commonly known as:  INCRUSE ELLIPTA Inhale 1 puff into the lungs daily.            Durable Medical Equipment  (From admission, onward)        Start     Ordered   12/06/17 1155  For home use only DME lightweight manual wheelchair with seat cushion  Once    Comments:  Patient suffers from gait instability which impairs their ability to perform daily activities like ambulation in the home.  A walker cane will not resolve  issue with performing activities of daily living. A wheelchair will allow patient to safely perform daily activities. Patient is not able to propel themselves in the home using a standard weight wheelchair due to generalized debility, recent acute hypoxic respiratory failure. Patient can self propel in the lightweight wheelchair.  Accessories: elevating leg rests (ELRs), wheel locks, extensions and anti-tippers.   12/06/17 1155   12/06/17 1132  For home use only DME Hospital bed  Once    Question Answer Comment  Patient has (list medical condition): severe COPD, ac respiratory failure with hypoxia, was intubated, extubated, generalized debility   The above medical condition requires: Patient requires the ability to reposition frequently   Head must be elevated greater than: 30 degrees   Bed type Semi-electric   Support Surface: Alternating Pressure Pad and Pump      12/06/17 1132   12/06/17 1034  For home use only DME oxygen  Once     Question Answer Comment  Mode or (Route) Nasal cannula   Liters per Minute 2   Frequency Continuous (stationary and portable oxygen unit needed)   Oxygen conserving device Yes   Oxygen delivery system Gas      12/06/17 1033       Brief H and P: For complete details please refer to admission H and P, but in brief 67 year old female with history of asthma, end-stage COPD, former smoker, hypertension, hydrocephalus status post VP shunt in 2010, anxiety, was admitted to the hospital on 1/30 with acute hypoxic and hypercarbic respiratory failure in the setting of COPD exacerbation. Patient was initially admitted to the hospitalist service, however shortly after admission she decompensated, her breathing got progressively worse requiring transfer to ICU and she was placed on ventilatory support on 2/1. She was extubated on 2/5. Her respiratory virus panel was positive only for RSV. She was transferred back to the hospitalist     Hospital Course:  Acute on chronic hypercarbic, hypoxic respiratory failure secondary to COPD exacerbation, VDRF, acute RSV infection  -Patient required mechanical intubation due to respiratory failure shortly after admission, she was successfully extubated on 2/5  -Improving, transition to oral prednisone with taper, continue nebs, Pulmicort - Home O2 evaluation was done, qualified for 2 L home O2.   -Palliative care was consulted and recommended palliative care to follow and hospice if needed    Acute encephalopathy/severe anxiety -Patient required Precedex in ICU which was discontinued 2/6  -Continue Seroquel and Klonopin, currently alert and oriented but very debilitated, frail   Hypertension -She is on Norvasc, Lasix, lisinopril, metoprolol at home -Continue metoprolol, Lasix, hydralazine as needed -Lasix increased to 40 mg daily, significant improvement after one IV dose of 40 mg Lasix on 2/10 a.m.  Bilateral upper extremity swelling -Ultrasound  negative for DVT, continue Lasix, 40 mg daily -Will need close follow-up with PCP, bmet  Acute on chronic diastolic CHF -2D echo with EF of 52-84%, grade 1 diastolic dysfunction -Patient received IV Lasix on 2/10 with significant improvement in her symptoms for swelling and shortness of breath.  She was transitioned to oral Lasix and increased to 40 mg daily.  She was on 20 mg Lasix at home.  Stage II sacral pressure ulcer -Appreciate wound care evaluation, continue local care  History of hydrocephalus -status post VP shunt  History of hyponatremia -Thought to be due to HCTZ, now discontinued, sodium is normal     Day of Discharge BP (!) 127/48 (BP Location: Left Arm)   Pulse 78   Temp 98.2 F (36.8 C) (Oral)   Resp 16   Ht 5\' 2"  (1.575 m)   Wt 62.6 kg (138 lb 0.1 oz)   SpO2 94%   BMI 25.24 kg/m   Physical Exam: General: Alert and awake oriented x3 not in any acute distress. HEENT: anicteric sclera, pupils reactive to light and accommodation CVS: S1-S2 clear no murmur rubs or gallops Chest: Mild scattered wheezing improved Abdomen: soft nontender, nondistended, normal bowel sounds Extremities: no cyanosis, clubbing or edema noted bilaterally Neuro: no new deficits   The results of significant diagnostics from this hospitalization (including imaging, microbiology, ancillary and laboratory) are listed below for reference.    LAB RESULTS: Basic Metabolic Panel: Recent Labs  Lab 12/01/17 0547  12/04/17 0500 12/05/17 0523  NA 139   < > 138 136  K 4.3   < > 3.8 4.1  CL 102   < > 97* 96*  CO2 33*   < > 38* 35*  GLUCOSE 112*   < > 122* 97  BUN 26*   < > 20 18  CREATININE 0.36*   < > 0.33* 0.40*  CALCIUM 8.3*   < > 8.1* 8.6*  MG 2.1  --   --   --   PHOS 2.9  --   --   --    < > = values in this interval not displayed.   Liver Function Tests: No results for input(s): AST, ALT, ALKPHOS, BILITOT, PROT, ALBUMIN in the last 168 hours. No results for input(s):  LIPASE, AMYLASE in the last 168 hours. No results for input(s): AMMONIA in the last 168 hours. CBC: Recent Labs  Lab 12/05/17 0523  WBC 15.1*  HGB 11.2*  HCT 32.8*  MCV 102.2*  PLT 464*   Cardiac Enzymes: No results for input(s): CKTOTAL, CKMB, CKMBINDEX, TROPONINI in the last 168 hours. BNP: Invalid input(s): POCBNP CBG: Recent Labs  Lab 12/01/17  1526 12/02/17 0737  GLUCAP 142* 122*    Significant Diagnostic Studies:  Dg Chest Port 1 View  Result Date: 11/24/2017 CLINICAL DATA:  Shortness of breath. EXAM: PORTABLE CHEST 1 VIEW COMPARISON:  11/15/2017 and 11/10/2017 and 11/20/2013 FINDINGS: The lungs are hyperinflated with flattening of the diaphragm consistent with emphysema. No acute infiltrates or effusions. Heart size is normal. Aortic atherosclerosis. Pulmonary vascularity is within normal limits. IMPRESSION: 1. Emphysema. 2. Aortic atherosclerosis. 3. No acute abnormalities. Electronically Signed   By: Lorriane Shire M.D.   On: 11/24/2017 16:08    2D ECHO: Study Conclusions  - Left ventricle: The cavity size was normal. Wall thickness was   normal. Systolic function was normal. The estimated ejection   fraction was in the range of 50% to 55%. Images were inadequate   for LV wall motion assessment. Doppler parameters are consistent   with abnormal left ventricular relaxation (grade 1 diastolic   dysfunction). The E/e&' ratio is >20, suggesting markedly elevated   LV filling pressure. - Left atrium: The atrium was normal in size. - Tricuspid valve: There was mild regurgitation. - Pulmonary arteries: PA peak pressure: 30 mm Hg (S). - Inferior vena cava: The vessel was normal in size. The   respirophasic diameter changes were in the normal range (= 50%),   consistent with normal central venous pressure. - Pericardium, extracardiac: Small pericardial effusion. Features   were not consistent with tamponade physiology.  Impressions:  - LVEF 50-55%, normal wall  thickness, grade 1 DD, high LV filling   pressure, normal LA size, mild TR, RVSP 30 mmHg, normal IVC,   small pericardial effusion without tamponade features  Disposition and Follow-up: Discharge Instructions    (HEART FAILURE PATIENTS) Call MD:  Anytime you have any of the following symptoms: 1) 3 pound weight gain in 24 hours or 5 pounds in 1 week 2) shortness of breath, with or without a dry hacking cough 3) swelling in the hands, feet or stomach 4) if you have to sleep on extra pillows at night in order to breathe.   Complete by:  As directed    Diet - low sodium heart healthy   Complete by:  As directed    Increase activity slowly   Complete by:  As directed        DISPOSITION: home    [place] Follow up.   Specialty:  Home Health Services Why:  Physical therapy, nursing Contact information: [address] [place] [address] [place], [place] Follow up.   Why:  hospital bed,wheelchair,oxygen Contact information: [address] [place] [address] [phone]        Brand Males, MD Follow up on 01/03/2018.   Specialty:  Pulmonary Disease Why:  at 9:30 AM  Contact information: [place] [address] [phone]            Time spent on Discharge: 38mins   Signed:   Estill Cotta M.D. [facility] 12/06/2017, 1:11 PM Pager: [phone]

## 2017-12-06 NOTE — Progress Notes (Signed)
[facility] arranged to pick up patient @ 0930PM. Attempted to call patients caregiver and sister-in-law Carlyle Lipa to go over AVS. No answer. Will continue to monitor patient.

## 2017-12-06 NOTE — Progress Notes (Signed)
Physical Therapy Treatment Patient Details Name: Angela Duncan MRN: [id number] DOB: [birth year] Today's Date: 12/06/2017    History of Present Illness Patient is a 67 y/o female admitted 11/24/17 with COPD exacerbation requiring ventilator. extubated 11/30/17.  PMH positive for HTN, [facility] Spotted Fever 2010, VP shunt for hydrocephalus 2010. stress induced cardiomyopathy, asthma, anxiety, delerium, hallucinations and paranoia, Recently discharged to stay with brother.    PT Comments    Pt on 2 lts nasal at rest 97%.  Removed for trial RA sats decreased to 90%.  Pt required much assist to sit to EOB.  Dyspnea 3/4.  Pt required much assist to stand and pivot to [facility].  See mobility details belwo.   Follow Up Recommendations  Other (comment)(per chart review, plan is to D/C to home with Hospice)     Equipment Recommendations  None recommended by PT    Recommendations for Other Services OT consult     Precautions / Restrictions Precautions Precautions: Fall Precaution Comments: oxygen dependent Restrictions Weight Bearing Restrictions: No    Mobility  Bed Mobility Overal bed mobility: Needs Assistance Bed Mobility: Supine to Sit     Supine to sit: Mod assist;Max assist     General bed mobility comments: facilitation LEs, assist to elevate trunk, bed pad used to assist scooting    Once upright pt is able to static sit briefly limited by fatigue and dyspnea  Transfers Overall transfer level: Needs assistance Equipment used: None Transfers: Stand Pivot Transfers Sit to Stand: Max assist Stand pivot transfers: Max assist       General transfer comment: Very weak.  Unable to rise self to full upright position.  + 2 side by side stand pivot 1/4 from elevated bed to [facility] with incomplete turn as B LE gave way.  pt unable to weight shift and unable to step either forward or backward.    Ambulation/Gait                 Stairs            Wheelchair Mobility    Modified Rankin (Stroke Patients Only)       Balance                                            Cognition Arousal/Alertness: Awake/alert Behavior During Therapy: WFL for tasks assessed/performed Overall Cognitive Status: Within Functional Limits for tasks assessed                                        Exercises      General Comments        Pertinent Vitals/Pain Pain Assessment: Faces Faces Pain Scale: Hurts a little bit Pain Location: generalized by patient's facial responses Pain Descriptors / Indicators: Discomfort;Grimacing Pain Intervention(s): Monitored during session    Home Living                      Prior Function            PT Goals (current goals can now be found in the care plan section) Progress towards PT goals: Progressing toward goals    Frequency    Min 2X/week      PT Plan Current plan remains appropriate    Co-evaluation  AM-PAC PT "6 Clicks" Daily Activity  Outcome Measure  Difficulty turning over in bed (including adjusting bedclothes, sheets and blankets)?: Unable Difficulty moving from lying on back to sitting on the side of the bed? : Unable Difficulty sitting down on and standing up from a chair with arms (e.g., wheelchair, bedside commode, etc,.)?: Unable Help needed moving to and from a bed to chair (including a wheelchair)?: Total Help needed walking in hospital room?: Total Help needed climbing 3-5 steps with a railing? : Total 6 Click Score: 6    End of Session Equipment Utilized During Treatment: Gait belt Activity Tolerance: Patient limited by fatigue;Other (comment)(dyspnea) Patient left: in chair;with call bell/phone within reach;with chair alarm set Nurse Communication: Mobility status PT Visit Diagnosis: Muscle weakness (generalized) (M62.81);Other abnormalities of gait and mobility (R26.89)     Time: 1140-1210 PT Time Calculation (min) (ACUTE  ONLY): 30 min  Charges:  $Therapeutic Activity: 23-37 mins                    G Codes:       Rica Koyanagi  PTA [facility]  Acute  Rehab Pager      [phone]

## 2017-12-06 NOTE — Progress Notes (Signed)
    Durable Medical Equipment  (From admission, onward)        Start     Ordered   12/06/17 1155  For home use only DME lightweight manual wheelchair with seat cushion  Once    Comments:  Patient suffers from gait instability which impairs their ability to perform daily activities like ambulation in the home.  A walker cane will not resolve  issue with performing activities of daily living. A wheelchair will allow patient to safely perform daily activities. Patient is not able to propel themselves in the home using a standard weight wheelchair due to generalized debility, recent acute hypoxic respiratory failure. Patient can self propel in the lightweight wheelchair.  Accessories: elevating leg rests (ELRs), wheel locks, extensions and anti-tippers.   12/06/17 1155   12/06/17 1132  For home use only DME Hospital bed  Once    Question Answer Comment  Patient has (list medical condition): severe COPD, ac respiratory failure with hypoxia, was intubated, extubated, generalized debility   The above medical condition requires: Patient requires the ability to reposition frequently   Head must be elevated greater than: 30 degrees   Bed type Semi-electric   Support Surface: Alternating Pressure Pad and Pump      12/06/17 1132   12/06/17 1034  For home use only DME oxygen  Once    Question Answer Comment  Mode or (Route) Nasal cannula   Liters per Minute 2   Frequency Continuous (stationary and portable oxygen unit needed)   Oxygen conserving device Yes   Oxygen delivery system Gas      12/06/17 1033

## 2017-12-06 NOTE — Progress Notes (Signed)
    Durable Medical Equipment  (From admission, onward)        Start     Ordered   12/06/17 1132  For home use only DME Hospital bed  Once    Question Answer Comment  Patient has (list medical condition): severe COPD, ac respiratory failure with hypoxia, was intubated, extubated, generalized debility   The above medical condition requires: Patient requires the ability to reposition frequently   Head must be elevated greater than: 30 degrees   Bed type Semi-electric   Support Surface: Alternating Pressure Pad and Pump      12/06/17 1132   12/06/17 1034  For home use only DME oxygen  Once    Question Answer Comment  Mode or (Route) Nasal cannula   Liters per Minute 2   Frequency Continuous (stationary and portable oxygen unit needed)   Oxygen conserving device Yes   Oxygen delivery system Gas      12/06/17 1033

## 2017-12-06 NOTE — Care Management Note (Signed)
Case Management Note  Patient Details  Name: Angela Duncan MRN: [id number] Date of Birth: 11/01/1950  Subjective/Objective:  Just received call from [place] will be staying @ the [place] address not in [place] where the home 02 is currently. [facility] dme rep Santiago Glad aware & following for dme delivery to home prior patient d/c by PT[facility]-MD/Nsg updated.                  Action/Plan:   Expected Discharge Date:  12/06/17               Expected Discharge Plan:  [place]  In-House Referral:     Discharge planning Services  CM Consult  Post Acute Care Choice:  Home Health, Resumption of Svcs/PTA Provider, Durable Medical Equipment(Active w/ [facility] [phone]) Choice offered to:  Patient  DME Arranged:  Air overlay mattress, Wheelchair manual, Hospital bed DME Agency:     HH Arranged:  PT, RN [facility] Agency:  [place]  Status of Service:  Completed, signed off  If discussed at [place] of Stay Meetings, dates discussed:    Additional Comments:  Dessa Phi, RN 12/06/2017, 2:42 PM

## 2017-12-06 NOTE — Progress Notes (Signed)
   12/06/17 1431  Clinical Encounter Type  Visited With Patient and family together  Spiritual Encounters  Spiritual Needs Prayer  Stress Factors  Patient Stress Factors None identified  Family Stress Factors Health changes   Rounding on Palliative patients.  Patient was in the bed with her sister at bedside.  Patient was welcoming and sister indicated she was being discharged today, to go to their brothers home.  Patient seemed comfortable, but a little confused.  Sister seemed worried about her.  We prayed together.  Will follow as needed. Chaplain Katherene Ponto

## 2017-12-06 NOTE — Progress Notes (Signed)
Faxed w/confirmation 02 sats-just received,along w/all orders to [facility] for delivery of dme to patients home, prior to patient d/c home-MD/Nsg updated.

## 2017-12-07 ENCOUNTER — Emergency Department (HOSPITAL_COMMUNITY)
Admission: EM | Admit: 2017-12-07 | Discharge: 2017-12-08 | Disposition: A | Discharge status: Home / Self Care | Payer: Medicare HMO | Attending: Emergency Medicine | Admitting: Emergency Medicine

## 2017-12-07 DIAGNOSIS — Z7982 Long term (current) use of aspirin: Secondary | ICD-10-CM | POA: Insufficient documentation

## 2017-12-07 DIAGNOSIS — R609 Edema, unspecified: Secondary | ICD-10-CM

## 2017-12-07 DIAGNOSIS — R600 Localized edema: Secondary | ICD-10-CM | POA: Diagnosis not present

## 2017-12-07 DIAGNOSIS — E876 Hypokalemia: Secondary | ICD-10-CM | POA: Diagnosis not present

## 2017-12-07 DIAGNOSIS — I10 Essential (primary) hypertension: Secondary | ICD-10-CM | POA: Diagnosis not present

## 2017-12-07 DIAGNOSIS — J441 Chronic obstructive pulmonary disease with (acute) exacerbation: Secondary | ICD-10-CM | POA: Insufficient documentation

## 2017-12-07 DIAGNOSIS — Z79899 Other long term (current) drug therapy: Secondary | ICD-10-CM | POA: Diagnosis not present

## 2017-12-07 DIAGNOSIS — R0602 Shortness of breath: Secondary | ICD-10-CM | POA: Diagnosis present

## 2017-12-07 NOTE — ED Notes (Signed)
Bed: IT94 Expected date:  Expected time:  Means of arrival:  Comments: EMS COPD/neb

## 2017-12-08 ENCOUNTER — Emergency Department (HOSPITAL_COMMUNITY): Payer: Medicare HMO

## 2017-12-08 ENCOUNTER — Encounter (HOSPITAL_COMMUNITY): Payer: Self-pay | Admitting: Emergency Medicine

## 2017-12-08 ENCOUNTER — Other Ambulatory Visit: Payer: Self-pay

## 2017-12-08 LAB — BASIC METABOLIC PANEL
Anion gap: 11 (ref 5–15)
BUN: 12 mg/dL (ref 6–20)
CHLORIDE: 93 mmol/L — AB (ref 101–111)
CO2: 36 mmol/L — ABNORMAL HIGH (ref 22–32)
CREATININE: 0.38 mg/dL — AB (ref 0.44–1.00)
Calcium: 8.5 mg/dL — ABNORMAL LOW (ref 8.9–10.3)
Glucose, Bld: 144 mg/dL — ABNORMAL HIGH (ref 65–99)
POTASSIUM: 3 mmol/L — AB (ref 3.5–5.1)
SODIUM: 140 mmol/L (ref 135–145)

## 2017-12-08 LAB — CBC WITH DIFFERENTIAL/PLATELET
Basophils Absolute: 0 10*3/uL (ref 0.0–0.1)
Basophils Relative: 0 %
EOS PCT: 1 %
Eosinophils Absolute: 0.1 10*3/uL (ref 0.0–0.7)
HCT: 32.5 % — ABNORMAL LOW (ref 36.0–46.0)
HEMOGLOBIN: 10.7 g/dL — AB (ref 12.0–15.0)
LYMPHS ABS: 1 10*3/uL (ref 0.7–4.0)
LYMPHS PCT: 8 %
MCH: 34.5 pg — AB (ref 26.0–34.0)
MCHC: 32.9 g/dL (ref 30.0–36.0)
MCV: 104.8 fL — AB (ref 78.0–100.0)
Monocytes Absolute: 1 10*3/uL (ref 0.1–1.0)
Monocytes Relative: 8 %
Neutro Abs: 10.8 10*3/uL — ABNORMAL HIGH (ref 1.7–7.7)
Neutrophils Relative %: 83 %
PLATELETS: 394 10*3/uL (ref 150–400)
RBC: 3.1 MIL/uL — AB (ref 3.87–5.11)
RDW: 13.9 % (ref 11.5–15.5)
WBC: 12.9 10*3/uL — AB (ref 4.0–10.5)

## 2017-12-08 LAB — TROPONIN I

## 2017-12-08 LAB — BLOOD GAS, VENOUS
ACID-BASE EXCESS: 16.4 mmol/L — AB (ref 0.0–2.0)
Bicarbonate: 42.7 mmol/L — ABNORMAL HIGH (ref 20.0–28.0)
DRAWN BY: 11249
O2 CONTENT: 2 L/min
O2 SAT: 54.2 %
PCO2 VEN: 59.5 mmHg (ref 44.0–60.0)
PH VEN: 7.47 — AB (ref 7.250–7.430)
Patient temperature: 98.6

## 2017-12-08 LAB — BRAIN NATRIURETIC PEPTIDE: B NATRIURETIC PEPTIDE 5: 196.5 pg/mL — AB (ref 0.0–100.0)

## 2017-12-08 MED ORDER — IPRATROPIUM-ALBUTEROL 0.5-2.5 (3) MG/3ML IN SOLN: 3.0000 mL | RESPIRATORY_TRACT | Status: DC

## 2017-12-08 MED ORDER — IPRATROPIUM-ALBUTEROL 0.5-2.5 (3) MG/3ML IN SOLN
RESPIRATORY_TRACT | Status: AC
  Filled 2017-12-08: qty 3

## 2017-12-08 MED ORDER — POTASSIUM CHLORIDE CRYS ER 20 MEQ PO TBCR
40.0000 meq | EXTENDED_RELEASE_TABLET | Freq: Once | ORAL | Status: AC
  Administered 2017-12-08: 40 meq via ORAL
  Filled 2017-12-08: qty 2

## 2017-12-08 MED ORDER — IPRATROPIUM-ALBUTEROL 0.5-2.5 (3) MG/3ML IN SOLN
3.0000 mL | RESPIRATORY_TRACT | Status: DC
  Administered 2017-12-08: 3 mL via RESPIRATORY_TRACT
  Filled 2017-12-08: qty 3

## 2017-12-08 NOTE — ED Triage Notes (Signed)
Pt reports having increasing shortness of breath for the last day and is on oxygen at home 2L. Pt reports recent hospitalization. EMS administered 5mg  Albuterol. Pt reports nonproductive cough.

## 2017-12-08 NOTE — ED Notes (Signed)
[facility] [employer] notified of need for transport of pt back to residence.  

## 2017-12-08 NOTE — ED Notes (Signed)
[facility] here for transport of pt back to residence.  

## 2017-12-08 NOTE — ED Provider Notes (Addendum)
[facility] DEPT Provider Note: Georgena Spurling, MD, FACEP  CSN: [id number] MRN: [id number] ARRIVAL: 12/07/17 at [place]: [place]  12/08/17 12:09 AM Angela Duncan is a 67 y.o. female with a history of COPD recently admitted with acute respiratory failure which required intubation.  She was discharged 2 days ago.  She is here with a return of shortness of breath that began when she was getting ready for bed yesterday evening.  Symptoms are moderate.  Symptoms are worse when lying supine.  She has an associated rattly cough as well as pain in her chest when she takes a deep breath.  She is also having edema of her lower legs which she states is been present "for quite a while".  She is unable to ambulate due to the edema and mobilizes with a wheelchair.  She denies nausea, vomiting or diarrhea.  She is not having a fever currently.    Past Medical History:  Diagnosis Date  . Anxiety    manged by Dr. Luan Pulling   . Asthma 1993   managed by Dr. Luan Pulling   . Depression    managed by Dr. Luan Pulling   . Hydrocephalus 2010  . Hypertension   . [place] fever 2010  . 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 ABDOMINAL HYSTERECTOMY    . BRAIN SURGERY    . CARDIAC CATHETERIZATION    . CHOLECYSTECTOMY    . cholecystectomy : stones    . partial hysterectomy - due to bleeding issues    . 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 TONSILLECTOMY      Family History  Problem Relation Age of Onset  . Diabetes Mother   . Hypertension Mother   . Thyroid cancer Mother   . Kidney failure Mother   . Hypertension Father   . Heart disease Brother        Stent in heart, also has blocklage in neck   . Throat cancer Sister   . Hypertension Sister   . Hyperlipidemia Sister   . Thyroid disease Sister     Social History   Tobacco Use  . Smoking status: Never Smoker    . Smokeless tobacco: Never Used  Substance Use Topics  . Alcohol use: Yes  . Drug use: No    Prior to Admission medications   Medication Sig Start Date End Date Taking? Authorizing Provider  aspirin (ASPIRIN LOW DOSE) 81 MG EC tablet Take 81 mg by mouth daily.     Yes [provider]  clonazePAM (KLONOPIN) 0.5 MG disintegrating tablet Take 1 tablet (0.5 mg total) by mouth 2 (two) times daily as needed (anxiety). 12/06/17  Yes Rai, Ripudeep K, MD  furosemide (LASIX) 40 MG tablet Take 1 tablet (40 mg total) by mouth daily. 12/06/17 05/05/18 Yes Rai, Ripudeep K, MD  ipratropium-albuterol (DUONEB) 0.5-2.5 (3) MG/3ML SOLN Take 3 mLs by nebulization 2 (two) times daily. 12/06/17  Yes Rai, Ripudeep K, MD  lisinopril (PRINIVIL,ZESTRIL) 5 MG tablet Take 1 tablet (5 mg total) by mouth daily. 11/16/17 11/16/18 Yes Georgette Shell, MD  metoprolol tartrate (LOPRESSOR) 25 MG tablet Take 1 tablet (25 mg total) by mouth 2 (two) times daily. 01/14/15  Yes Fayrene Helper, MD  pantoprazole (PROTONIX) 40 MG tablet Take 1 tablet (40 mg total) by mouth daily. 12/07/17  Yes Rai, Vernelle Emerald, MD  potassium chloride (K-DUR) 10 MEQ tablet Take 1 tablet (10 mEq total) by mouth daily. 11/16/17  Yes Georgette Shell, MD  pravastatin (PRAVACHOL) 10 MG tablet TAKE 1 TABLETBY MOUTH DAILY AT 6 PM. 11/16/17  Yes [provider]  QUEtiapine (SEROQUEL) 25 MG tablet Take 1 tablet (25 mg) in the morning, 2 tablets (50 mg) at bedtime 12/06/17  Yes Rai, Ripudeep K, MD  senna-docusate (SENOKOT-S) 8.6-50 MG tablet Take 2 tablets by mouth at bedtime. For constipation 12/06/17  Yes Rai, Ripudeep K, MD  traMADol (ULTRAM) 50 MG tablet Take 0.5 tablets (25 mg total) by mouth every 12 (twelve) hours as needed for moderate pain. 12/06/17  Yes Rai, Ripudeep K, MD  fluticasone furoate-vilanterol (BREO ELLIPTA) 100-25 MCG/INH AEPB Inhale 1 puff into the lungs daily. 12/06/17   Rai, Vernelle Emerald, MD  predniSONE (DELTASONE) 10 MG  tablet Prednisone dosing: Take  Prednisone 40mg  (4 tabs) x 2 days, then taper to 30mg  (3 tabs) x 3 days, then 20mg  (2 tabs) x 3days, then 10mg  (1 tab) x 3days, then OFF. Patient not taking: Reported on 12/08/2017 12/06/17   Rai, Vernelle Emerald, MD  umeclidinium bromide (INCRUSE ELLIPTA) 62.5 MCG/INH AEPB Inhale 1 puff into the lungs daily. 12/06/17   Rai, Vernelle Emerald, MD    Allergies Hydrochlorothiazide and Penicillins   REVIEW OF SYSTEMS  Negative except as noted here or in the History of Present Illness.   PHYSICAL EXAMINATION  Initial Vital Signs Blood pressure (!) 163/64, pulse 73, temperature 97.8 F (36.6 C), temperature source Oral, resp. rate 15, SpO2 96 %.  Examination General: Frail-appearing female in no acute distress; appears older than age of record HENT: normocephalic; atraumatic; pharynx normal; VP shunt palpable under left occipital scalp Eyes: pupils equal, round and reactive to light; extraocular muscles intact Neck: supple Heart: regular rate and rhythm; distant sounds Lungs: Distant sounds; rattly cough Abdomen: soft; nondistended; nontender; no masses or hepatosplenomegaly; bowel sounds present Extremities: No deformity; full range of motion; pulses normal; 2+ pitting edema of lower legs Neurologic: Awake, alert and oriented; motor function intact in all extremities and symmetric; no facial droop Skin: Warm and dry Psychiatric: Normal mood and affect   RESULTS  Summary of this visit's results, reviewed by myself:   EKG Interpretation  Date/Time:  Wednesday December 08 2017 00:17:07 EST Ventricular Rate:  72 PR Interval:    QRS Duration: 98 QT Interval:  391 QTC Calculation: 428 R Axis:   74 Text Interpretation:  Sinus rhythm Short PR interval Artifact No significant change was found Confirmed by Shanon Rosser [phone]) on 12/08/2017 12:20:20 AM      Laboratory Studies: Results for orders placed or performed during the hospital encounter of 12/07/17 (from the  past 24 hour(s))  CBC with Differential/Platelet     Status: Abnormal   Collection Time: 12/08/17 12:33 AM  Result Value Ref Range   WBC 12.9 (H) 4.0 - 10.5 K/uL   RBC 3.10 (L) 3.87 - 5.11 MIL/uL   Hemoglobin 10.7 (L) 12.0 - 15.0 g/dL   HCT 32.5 (L) 36.0 - 46.0 %   MCV 104.8 (H) 78.0 - 100.0 fL   MCH 34.5 (H) 26.0 - 34.0 pg   MCHC 32.9 30.0 - 36.0 g/dL   RDW 13.9 11.5 - 15.5 %   Platelets 394 150 - 400 K/uL   Neutrophils Relative % 83 %   Neutro Abs 10.8 (H) 1.7 - 7.7 K/uL   Lymphocytes Relative 8 %   Lymphs Abs 1.0  0.7 - 4.0 K/uL   Monocytes Relative 8 %   Monocytes Absolute 1.0 0.1 - 1.0 K/uL   Eosinophils Relative 1 %   Eosinophils Absolute 0.1 0.0 - 0.7 K/uL   Basophils Relative 0 %   Basophils Absolute 0.0 0.0 - 0.1 K/uL  Brain natriuretic peptide     Status: Abnormal   Collection Time: 12/08/17 12:33 AM  Result Value Ref Range   B Natriuretic Peptide 196.5 (H) 0.0 - 100.0 pg/mL  Basic metabolic panel     Status: Abnormal   Collection Time: 12/08/17 12:33 AM  Result Value Ref Range   Sodium 140 135 - 145 mmol/L   Potassium 3.0 (L) 3.5 - 5.1 mmol/L   Chloride 93 (L) 101 - 111 mmol/L   CO2 36 (H) 22 - 32 mmol/L   Glucose, Bld 144 (H) 65 - 99 mg/dL   BUN 12 6 - 20 mg/dL   Creatinine, Ser 0.38 (L) 0.44 - 1.00 mg/dL   Calcium 8.5 (L) 8.9 - 10.3 mg/dL   GFR calc non Af Amer >60 >60 mL/min   GFR calc Af Amer >60 >60 mL/min   Anion gap 11 5 - 15  Troponin I     Status: None   Collection Time: 12/08/17 12:33 AM  Result Value Ref Range   Troponin I <0.03 <0.03 ng/mL  Blood gas, venous     Status: Abnormal   Collection Time: 12/08/17 12:43 AM  Result Value Ref Range   O2 Content 2.0 L/min   pH, Ven 7.470 (H) 7.250 - 7.430   pCO2, Ven 59.5 44.0 - 60.0 mmHg   pO2, Ven  32.0 - 45.0 mmHg    CRITICAL RESULT CALLED TO, READ BACK BY AND VERIFIED WITH:   Bicarbonate 42.7 (H) 20.0 - 28.0 mmol/L   Acid-Base Excess 16.4 (H) 0.0 - 2.0 mmol/L   O2 Saturation 54.2 %   Patient  temperature 98.6    Collection site VEIN    Drawn by [phone]    Sample type VENOUS    Imaging Studies: Dg Chest 2 View  Result Date: 12/08/2017 CLINICAL DATA:  Dyspnea, COPD EXAM: CHEST  2 VIEW COMPARISON:  11/29/2017 FINDINGS: Emphysematous hyperinflation of the lungs. No overt pulmonary edema, effusion or pneumothorax. No pneumonic consolidations. Normal heart size with aortic atherosclerosis. Shunt tubing projects over the left hemithorax. Additional support lines and tubes have been removed in the interim. IMPRESSION: 1. Emphysematous hyperinflation of the lungs consistent with COPD. No active pulmonary disease. 2. Shunt tubing projects over the left hemithorax. Electronically Signed   By: Ashley Royalty M.D.   On: 12/08/2017 01:26    ED COURSE  Nursing notes and initial vitals signs, including pulse oximetry, reviewed.  Vitals:   12/08/17 0014 12/08/17 0100 12/08/17 0130 12/08/17 0200  BP:  (!) 151/78 (!) 151/71 (!) 121/57  Pulse:  70 70 72  Resp:  15 15 14   Temp:      TempSrc:      SpO2:  98% 96% 95%  Weight: 62.6 kg (138 lb)     Height: 5\' 3"  (1.6 m)      2:50 AM Patient's breathing improved after neb treatment x2.  Patient in no distress, no tachypnea and no accessory muscle use.  Her lower extremity edema is chronic.  She was previously on a diuretic but states she has been told to discontinue this.  She is on K-Dur for hypokalemia but we will give her a dose in the ED. the patient  just finished a prednisone taper.  I do not believe additional steroids are indicated at this time.  PROCEDURES    ED DIAGNOSES     ICD-10-CM   1. COPD exacerbation ([place]) J44.1   2. Hypokalemia E87.6   3. Peripheral edema R60.9        Shanon Rosser, MD 12/08/17 0251    Shanon Rosser, MD 12/08/17 [phone]

## 2017-12-08 NOTE — ED Notes (Signed)
Voice message left for brother in HIPAA compliance. Other number attempted and busy signal received.

## 2018-01-03 ENCOUNTER — Institutional Professional Consult (permissible substitution): Payer: Medicare HMO | Admitting: Internal Medicine
